# Patient Record
Sex: Female | Born: 1940 | Race: White | Hispanic: No | Marital: Married | Smoking: Never smoker
Health system: Southern US, Community
[De-identification: ages and names within clinical notes are randomized; demographics above are authoritative.]

## PROBLEM LIST (undated history)

## (undated) DIAGNOSIS — G5793 Unspecified mononeuropathy of bilateral lower limbs: Secondary | ICD-10-CM

## (undated) DIAGNOSIS — B54 Unspecified malaria: Secondary | ICD-10-CM

## (undated) DIAGNOSIS — M1990 Unspecified osteoarthritis, unspecified site: Secondary | ICD-10-CM

## (undated) DIAGNOSIS — E039 Hypothyroidism, unspecified: Secondary | ICD-10-CM

## (undated) DIAGNOSIS — G709 Myoneural disorder, unspecified: Secondary | ICD-10-CM

## (undated) DIAGNOSIS — J189 Pneumonia, unspecified organism: Secondary | ICD-10-CM

## (undated) DIAGNOSIS — E119 Type 2 diabetes mellitus without complications: Secondary | ICD-10-CM

## (undated) DIAGNOSIS — M797 Fibromyalgia: Secondary | ICD-10-CM

## (undated) DIAGNOSIS — I10 Essential (primary) hypertension: Secondary | ICD-10-CM

## (undated) DIAGNOSIS — F32A Depression, unspecified: Secondary | ICD-10-CM

## (undated) DIAGNOSIS — K219 Gastro-esophageal reflux disease without esophagitis: Secondary | ICD-10-CM

## (undated) DIAGNOSIS — F329 Major depressive disorder, single episode, unspecified: Secondary | ICD-10-CM

## (undated) DIAGNOSIS — G4730 Sleep apnea, unspecified: Secondary | ICD-10-CM

## (undated) DIAGNOSIS — Z8719 Personal history of other diseases of the digestive system: Secondary | ICD-10-CM

## (undated) DIAGNOSIS — I2510 Atherosclerotic heart disease of native coronary artery without angina pectoris: Secondary | ICD-10-CM

## (undated) HISTORY — PX: BACK SURGERY: SHX140

## (undated) HISTORY — PX: TONSILLECTOMY: SUR1361

## (undated) HISTORY — PX: CHOLECYSTECTOMY: SHX55

## (undated) HISTORY — PX: APPENDECTOMY: SHX54

## (undated) HISTORY — PX: ABDOMINAL HYSTERECTOMY: SHX81

## (undated) SURGERY — EGD (ESOPHAGOGASTRODUODENOSCOPY)
Anesthesia: Moderate Sedation

---

## 1995-03-23 DIAGNOSIS — J189 Pneumonia, unspecified organism: Secondary | ICD-10-CM

## 1995-03-23 HISTORY — DX: Pneumonia, unspecified organism: J18.9

## 2000-03-22 HISTORY — PX: BLADDER SUSPENSION: SHX72

## 2004-01-08 ENCOUNTER — Ambulatory Visit: Payer: Self-pay | Admitting: Internal Medicine

## 2005-02-18 ENCOUNTER — Ambulatory Visit: Payer: Self-pay | Admitting: Internal Medicine

## 2006-02-22 ENCOUNTER — Ambulatory Visit: Payer: Self-pay | Admitting: Internal Medicine

## 2007-01-16 ENCOUNTER — Ambulatory Visit: Payer: Self-pay | Admitting: Urology

## 2007-03-07 ENCOUNTER — Ambulatory Visit: Payer: Self-pay | Admitting: Internal Medicine

## 2007-04-04 ENCOUNTER — Ambulatory Visit: Payer: Self-pay | Admitting: Gastroenterology

## 2007-05-04 ENCOUNTER — Ambulatory Visit: Payer: Self-pay | Admitting: Gastroenterology

## 2008-03-07 ENCOUNTER — Ambulatory Visit: Payer: Self-pay | Admitting: Internal Medicine

## 2008-10-16 ENCOUNTER — Ambulatory Visit: Payer: Self-pay | Admitting: Urology

## 2008-10-21 ENCOUNTER — Ambulatory Visit: Payer: Self-pay | Admitting: Urology

## 2009-05-29 ENCOUNTER — Ambulatory Visit: Payer: Self-pay | Admitting: Internal Medicine

## 2010-05-28 ENCOUNTER — Ambulatory Visit: Payer: Self-pay

## 2013-01-02 ENCOUNTER — Ambulatory Visit: Payer: Self-pay | Admitting: Internal Medicine

## 2013-09-04 ENCOUNTER — Ambulatory Visit: Payer: Self-pay | Admitting: Internal Medicine

## 2013-09-05 ENCOUNTER — Ambulatory Visit: Payer: Self-pay | Admitting: Urology

## 2014-01-08 DIAGNOSIS — M5136 Other intervertebral disc degeneration, lumbar region: Secondary | ICD-10-CM | POA: Insufficient documentation

## 2014-01-08 DIAGNOSIS — M51369 Other intervertebral disc degeneration, lumbar region without mention of lumbar back pain or lower extremity pain: Secondary | ICD-10-CM | POA: Insufficient documentation

## 2014-05-03 DIAGNOSIS — M4800 Spinal stenosis, site unspecified: Secondary | ICD-10-CM | POA: Insufficient documentation

## 2014-05-03 DIAGNOSIS — E669 Obesity, unspecified: Secondary | ICD-10-CM | POA: Insufficient documentation

## 2014-05-03 DIAGNOSIS — E039 Hypothyroidism, unspecified: Secondary | ICD-10-CM | POA: Insufficient documentation

## 2014-05-03 DIAGNOSIS — I10 Essential (primary) hypertension: Secondary | ICD-10-CM | POA: Insufficient documentation

## 2014-05-03 DIAGNOSIS — I2510 Atherosclerotic heart disease of native coronary artery without angina pectoris: Secondary | ICD-10-CM | POA: Insufficient documentation

## 2014-05-03 DIAGNOSIS — G629 Polyneuropathy, unspecified: Secondary | ICD-10-CM | POA: Insufficient documentation

## 2014-05-03 DIAGNOSIS — E785 Hyperlipidemia, unspecified: Secondary | ICD-10-CM | POA: Insufficient documentation

## 2014-10-17 ENCOUNTER — Ambulatory Visit
Admission: RE | Admit: 2014-10-17 | Discharge: 2014-10-17 | Disposition: A | Discharge status: Home / Self Care | Payer: Medicare Other | Source: Ambulatory Visit | Attending: Otolaryngology | Admitting: Otolaryngology

## 2014-10-17 ENCOUNTER — Other Ambulatory Visit: Payer: Self-pay | Admitting: Otolaryngology

## 2014-10-17 DIAGNOSIS — R05 Cough: Secondary | ICD-10-CM

## 2014-10-17 DIAGNOSIS — R918 Other nonspecific abnormal finding of lung field: Secondary | ICD-10-CM | POA: Insufficient documentation

## 2014-10-17 DIAGNOSIS — R059 Cough, unspecified: Secondary | ICD-10-CM

## 2014-10-17 DIAGNOSIS — K449 Diaphragmatic hernia without obstruction or gangrene: Secondary | ICD-10-CM | POA: Diagnosis not present

## 2014-11-22 ENCOUNTER — Other Ambulatory Visit: Payer: Self-pay | Admitting: Specialist

## 2014-11-22 DIAGNOSIS — J984 Other disorders of lung: Secondary | ICD-10-CM

## 2014-11-28 ENCOUNTER — Ambulatory Visit
Admission: RE | Admit: 2014-11-28 | Discharge: 2014-11-28 | Disposition: A | Discharge status: Home / Self Care | Payer: Medicare Other | Source: Ambulatory Visit | Attending: Specialist | Admitting: Specialist

## 2014-11-28 DIAGNOSIS — K449 Diaphragmatic hernia without obstruction or gangrene: Secondary | ICD-10-CM | POA: Insufficient documentation

## 2014-11-28 DIAGNOSIS — I517 Cardiomegaly: Secondary | ICD-10-CM | POA: Diagnosis not present

## 2014-11-28 DIAGNOSIS — J984 Other disorders of lung: Secondary | ICD-10-CM | POA: Diagnosis not present

## 2014-11-28 DIAGNOSIS — I2510 Atherosclerotic heart disease of native coronary artery without angina pectoris: Secondary | ICD-10-CM | POA: Diagnosis not present

## 2014-11-28 HISTORY — DX: Type 2 diabetes mellitus without complications: E11.9

## 2014-11-28 HISTORY — DX: Essential (primary) hypertension: I10

## 2014-11-28 MED ORDER — IOHEXOL 300 MG/ML  SOLN
75.0000 mL | Freq: Once | INTRAMUSCULAR | Status: AC | PRN
  Administered 2014-11-28: 75 mL via INTRAVENOUS

## 2014-12-05 ENCOUNTER — Other Ambulatory Visit: Payer: Self-pay | Admitting: Specialist

## 2014-12-05 DIAGNOSIS — R0602 Shortness of breath: Secondary | ICD-10-CM

## 2014-12-12 ENCOUNTER — Ambulatory Visit
Admission: RE | Admit: 2014-12-12 | Discharge: 2014-12-12 | Disposition: A | Discharge status: Home / Self Care | Payer: Medicare Other | Source: Ambulatory Visit | Attending: Specialist | Admitting: Specialist

## 2014-12-12 DIAGNOSIS — R0602 Shortness of breath: Secondary | ICD-10-CM

## 2015-05-08 DIAGNOSIS — E1121 Type 2 diabetes mellitus with diabetic nephropathy: Secondary | ICD-10-CM | POA: Insufficient documentation

## 2015-10-23 DIAGNOSIS — M1711 Unilateral primary osteoarthritis, right knee: Secondary | ICD-10-CM | POA: Diagnosis present

## 2015-11-04 DIAGNOSIS — I2721 Secondary pulmonary arterial hypertension: Secondary | ICD-10-CM | POA: Insufficient documentation

## 2015-12-09 ENCOUNTER — Other Ambulatory Visit: Payer: Self-pay | Admitting: Internal Medicine

## 2015-12-09 DIAGNOSIS — Z1231 Encounter for screening mammogram for malignant neoplasm of breast: Secondary | ICD-10-CM

## 2015-12-31 ENCOUNTER — Encounter
Admission: RE | Admit: 2015-12-31 | Discharge: 2015-12-31 | Disposition: A | Discharge status: Home / Self Care | Payer: Medicare Other | Source: Ambulatory Visit | Attending: Orthopedic Surgery | Admitting: Orthopedic Surgery

## 2015-12-31 DIAGNOSIS — Z01810 Encounter for preprocedural cardiovascular examination: Secondary | ICD-10-CM | POA: Diagnosis present

## 2015-12-31 DIAGNOSIS — E119 Type 2 diabetes mellitus without complications: Secondary | ICD-10-CM | POA: Diagnosis not present

## 2015-12-31 DIAGNOSIS — Z1231 Encounter for screening mammogram for malignant neoplasm of breast: Secondary | ICD-10-CM | POA: Diagnosis not present

## 2015-12-31 DIAGNOSIS — Z01812 Encounter for preprocedural laboratory examination: Secondary | ICD-10-CM | POA: Diagnosis present

## 2015-12-31 DIAGNOSIS — I10 Essential (primary) hypertension: Secondary | ICD-10-CM | POA: Diagnosis not present

## 2015-12-31 HISTORY — DX: Pneumonia, unspecified organism: J18.9

## 2015-12-31 HISTORY — DX: Unspecified mononeuropathy of bilateral lower limbs: G57.93

## 2015-12-31 HISTORY — DX: Depression, unspecified: F32.A

## 2015-12-31 HISTORY — DX: Myoneural disorder, unspecified: G70.9

## 2015-12-31 HISTORY — DX: Major depressive disorder, single episode, unspecified: F32.9

## 2015-12-31 HISTORY — DX: Gastro-esophageal reflux disease without esophagitis: K21.9

## 2015-12-31 HISTORY — DX: Unspecified malaria: B54

## 2015-12-31 HISTORY — DX: Fibromyalgia: M79.7

## 2015-12-31 HISTORY — DX: Unspecified osteoarthritis, unspecified site: M19.90

## 2015-12-31 HISTORY — DX: Personal history of other diseases of the digestive system: Z87.19

## 2015-12-31 HISTORY — DX: Hypothyroidism, unspecified: E03.9

## 2015-12-31 LAB — COMPREHENSIVE METABOLIC PANEL
ALBUMIN: 4.1 g/dL (ref 3.5–5.0)
ALT: 16 U/L (ref 14–54)
ANION GAP: 9 (ref 5–15)
AST: 21 U/L (ref 15–41)
Alkaline Phosphatase: 98 U/L (ref 38–126)
BUN: 25 mg/dL — AB (ref 6–20)
CHLORIDE: 101 mmol/L (ref 101–111)
CO2: 28 mmol/L (ref 22–32)
Calcium: 10.4 mg/dL — ABNORMAL HIGH (ref 8.9–10.3)
Creatinine, Ser: 1.17 mg/dL — ABNORMAL HIGH (ref 0.44–1.00)
GFR calc Af Amer: 52 mL/min — ABNORMAL LOW (ref >60)
GFR calc non Af Amer: 45 mL/min — ABNORMAL LOW (ref >60)
GLUCOSE: 115 mg/dL — AB (ref 65–99)
POTASSIUM: 4 mmol/L (ref 3.5–5.1)
SODIUM: 138 mmol/L (ref 135–145)
TOTAL PROTEIN: 7.2 g/dL (ref 6.5–8.1)
Total Bilirubin: 0.5 mg/dL (ref 0.3–1.2)

## 2015-12-31 LAB — TYPE AND SCREEN
ABO/RH(D): A POS
ANTIBODY SCREEN: NEGATIVE

## 2015-12-31 LAB — URINALYSIS COMPLETE WITH MICROSCOPIC (ARMC ONLY)
BACTERIA UA: NONE SEEN
Bilirubin Urine: NEGATIVE
GLUCOSE, UA: NEGATIVE mg/dL
Hgb urine dipstick: NEGATIVE
Ketones, ur: NEGATIVE mg/dL
Leukocytes, UA: NEGATIVE
NITRITE: NEGATIVE
PROTEIN: NEGATIVE mg/dL
SPECIFIC GRAVITY, URINE: 1.01 (ref 1.005–1.030)
pH: 6 (ref 5.0–8.0)

## 2015-12-31 LAB — CBC
HCT: 36.5 % (ref 35.0–47.0)
Hemoglobin: 12.1 g/dL (ref 12.0–16.0)
MCH: 31.6 pg (ref 26.0–34.0)
MCHC: 33.1 g/dL (ref 32.0–36.0)
MCV: 95.6 fL (ref 80.0–100.0)
PLATELETS: 252 10*3/uL (ref 150–440)
RBC: 3.82 MIL/uL (ref 3.80–5.20)
RDW: 13.7 % (ref 11.5–14.5)
WBC: 8.3 10*3/uL (ref 3.6–11.0)

## 2015-12-31 LAB — SEDIMENTATION RATE: SED RATE: 16 mm/h (ref 0–30)

## 2015-12-31 LAB — APTT: APTT: 31 s (ref 24–36)

## 2015-12-31 LAB — SURGICAL PCR SCREEN
MRSA, PCR: NEGATIVE
STAPHYLOCOCCUS AUREUS: NEGATIVE

## 2015-12-31 LAB — PROTIME-INR
INR: 0.95
PROTHROMBIN TIME: 12.7 s (ref 11.4–15.2)

## 2015-12-31 NOTE — Patient Instructions (Signed)
  Your procedure is scheduled on: Monday Jan 12, 2016. Report to Same Day Surgery. To find out your arrival time please call [phone] between 1PM - 3PM on Friday Oct. 20, 2017.  Remember: Instructions that are not followed completely may result in serious medical risk, up to and including death, or upon the discretion of your surgeon and anesthesiologist your surgery may need to be rescheduled.    _x___ 1. Do not eat food or drink liquids after midnight. No gum chewing or hard candies.     ____ 2. No Alcohol for 24 hours before or after surgery.   ____ 3. Bring all medications with you on the day of surgery if instructed.    __x__ 4. Notify your doctor if there is any change in your medical condition     (cold, fever, infections).    _____ 5. No smoking 24 hours prior to surgery.     Do not wear jewelry, make-up, hairpins, clips or nail polish.  Do not wear lotions, powders, or perfumes.   Do not shave 48 hours prior to surgery. Men may shave face and neck.  Do not bring valuables to the hospital.    [facility] is not responsible for any belongings or valuables.               Contacts, dentures or bridgework may not be worn into surgery.  Leave your suitcase in the car. After surgery it may be brought to your room.  For patients admitted to the hospital, discharge time is determined by your treatment team.   Patients discharged the day of surgery will not be allowed to drive home.    Please read over the following fact sheets that you were given:   [facility] Preparing [facility]  _x___ Take these medicines the morning of surgery with A SIP OF WATER:    1.amLODipine (NORVASC)    2. atorvastatin (LIPITOR)  3. citalopram (CELEXA)  4.gabapentin (NEURONTIN)  5.levothyroxine (SYNTHROID, LEVOTHROID)  6.pantoprazole (PROTONIX)  ____ Fleet Enema (as directed)   _x___ Use CHG Soap as directed on instruction sheet  ____ Use inhalers on the day of surgery and bring to  hospital day of surgery  ____ Stop metformin 2 days prior to surgery    _x___ Take 1/2 of usual insulin dose the night before surgery and none on the morning of surgery.   ____ Stop Coumadin/Plavix/aspirin on does not apply.  _x___ Stop Anti-inflammatories such as Advil, Aleve, Ibuprofen, Motrin, Naproxen, Naprosyn, Goodies powders or aspirin products. OK to take Tylenol.   ____ Stop supplements until after surgery.    ____ Bring C-Pap to the hospital.

## 2015-12-31 NOTE — Pre-Procedure Instructions (Signed)
REQUEST FOR MEDICAL CLEARANCE/EKG,AS INSTRUCTED BY DR ADAMS CALLED AND FAXED TO [facility] AT DR [facility] 

## 2016-01-01 ENCOUNTER — Ambulatory Visit
Admission: RE | Admit: 2016-01-01 | Discharge: 2016-01-01 | Disposition: A | Discharge status: Home / Self Care | Payer: Medicare Other | Source: Ambulatory Visit | Attending: Internal Medicine | Admitting: Internal Medicine

## 2016-01-01 DIAGNOSIS — Z01812 Encounter for preprocedural laboratory examination: Secondary | ICD-10-CM | POA: Insufficient documentation

## 2016-01-01 DIAGNOSIS — Z1231 Encounter for screening mammogram for malignant neoplasm of breast: Secondary | ICD-10-CM | POA: Insufficient documentation

## 2016-01-01 DIAGNOSIS — E119 Type 2 diabetes mellitus without complications: Secondary | ICD-10-CM | POA: Insufficient documentation

## 2016-01-01 DIAGNOSIS — I10 Essential (primary) hypertension: Secondary | ICD-10-CM | POA: Insufficient documentation

## 2016-01-01 DIAGNOSIS — Z01810 Encounter for preprocedural cardiovascular examination: Secondary | ICD-10-CM | POA: Insufficient documentation

## 2016-01-01 LAB — URINE CULTURE
Culture: <10000 — AB
SPECIAL REQUESTS: NORMAL

## 2016-01-01 LAB — HEMOGLOBIN A1C
HEMOGLOBIN A1C: 5.9 % — AB (ref 4.8–5.6)
Mean Plasma Glucose: 123 mg/dL

## 2016-01-12 ENCOUNTER — Inpatient Hospital Stay
Admission: RE | Admit: 2016-01-12 | Discharge: 2016-01-14 | DRG: 470 | Disposition: A | Discharge status: Home Health | Payer: Medicare Other | Source: Ambulatory Visit | Attending: Orthopedic Surgery | Admitting: Orthopedic Surgery

## 2016-01-12 ENCOUNTER — Encounter: Payer: Self-pay | Admitting: Orthopedic Surgery

## 2016-01-12 ENCOUNTER — Inpatient Hospital Stay: Payer: Medicare Other | Admitting: Anesthesiology

## 2016-01-12 ENCOUNTER — Encounter
Admission: RE | Disposition: A | Discharge status: Home Health | Payer: Self-pay | Source: Ambulatory Visit | Attending: Orthopedic Surgery

## 2016-01-12 ENCOUNTER — Inpatient Hospital Stay: Payer: Medicare Other

## 2016-01-12 DIAGNOSIS — K219 Gastro-esophageal reflux disease without esophagitis: Secondary | ICD-10-CM | POA: Diagnosis present

## 2016-01-12 DIAGNOSIS — M1711 Unilateral primary osteoarthritis, right knee: Principal | ICD-10-CM | POA: Diagnosis present

## 2016-01-12 DIAGNOSIS — E669 Obesity, unspecified: Secondary | ICD-10-CM | POA: Diagnosis present

## 2016-01-12 DIAGNOSIS — E785 Hyperlipidemia, unspecified: Secondary | ICD-10-CM | POA: Diagnosis present

## 2016-01-12 DIAGNOSIS — F329 Major depressive disorder, single episode, unspecified: Secondary | ICD-10-CM | POA: Diagnosis present

## 2016-01-12 DIAGNOSIS — Z23 Encounter for immunization: Secondary | ICD-10-CM

## 2016-01-12 DIAGNOSIS — I2721 Secondary pulmonary arterial hypertension: Secondary | ICD-10-CM | POA: Diagnosis present

## 2016-01-12 DIAGNOSIS — D62 Acute posthemorrhagic anemia: Secondary | ICD-10-CM | POA: Diagnosis not present

## 2016-01-12 DIAGNOSIS — R262 Difficulty in walking, not elsewhere classified: Secondary | ICD-10-CM

## 2016-01-12 DIAGNOSIS — I2510 Atherosclerotic heart disease of native coronary artery without angina pectoris: Secondary | ICD-10-CM | POA: Diagnosis present

## 2016-01-12 DIAGNOSIS — I10 Essential (primary) hypertension: Secondary | ICD-10-CM | POA: Diagnosis present

## 2016-01-12 DIAGNOSIS — Z96659 Presence of unspecified artificial knee joint: Secondary | ICD-10-CM

## 2016-01-12 DIAGNOSIS — M6281 Muscle weakness (generalized): Secondary | ICD-10-CM

## 2016-01-12 DIAGNOSIS — M797 Fibromyalgia: Secondary | ICD-10-CM | POA: Diagnosis present

## 2016-01-12 DIAGNOSIS — E039 Hypothyroidism, unspecified: Secondary | ICD-10-CM | POA: Diagnosis present

## 2016-01-12 DIAGNOSIS — E1142 Type 2 diabetes mellitus with diabetic polyneuropathy: Secondary | ICD-10-CM | POA: Diagnosis present

## 2016-01-12 DIAGNOSIS — Z6841 Body Mass Index (BMI) 40.0 and over, adult: Secondary | ICD-10-CM

## 2016-01-12 DIAGNOSIS — Z79899 Other long term (current) drug therapy: Secondary | ICD-10-CM

## 2016-01-12 DIAGNOSIS — M25561 Pain in right knee: Secondary | ICD-10-CM | POA: Diagnosis present

## 2016-01-12 HISTORY — PX: KNEE ARTHROPLASTY: SHX992

## 2016-01-12 LAB — GLUCOSE, CAPILLARY
GLUCOSE-CAPILLARY: 174 mg/dL — AB (ref 65–99)
Glucose-Capillary: 134 mg/dL — ABNORMAL HIGH (ref 65–99)
Glucose-Capillary: 211 mg/dL — ABNORMAL HIGH (ref 65–99)

## 2016-01-12 LAB — ABO/RH: ABO/RH(D): A POS

## 2016-01-12 SURGERY — ARTHROPLASTY, KNEE, TOTAL, USING IMAGELESS COMPUTER-ASSISTED NAVIGATION
Anesthesia: General | Laterality: Right | Wound class: Clean

## 2016-01-12 MED ORDER — HYDROMORPHONE HCL 1 MG/ML IJ SOLN
INTRAMUSCULAR | Status: AC
  Filled 2016-01-12: qty 1

## 2016-01-12 MED ORDER — ACETAMINOPHEN 10 MG/ML IV SOLN
INTRAVENOUS | Status: DC | PRN
Start: 2016-01-12 — End: 2016-01-12
  Administered 2016-01-12: 1000 mg via INTRAVENOUS

## 2016-01-12 MED ORDER — ENOXAPARIN SODIUM 40 MG/0.4ML ~~LOC~~ SOLN
40.0000 mg | Freq: Two times a day (BID) | SUBCUTANEOUS | Status: DC
  Administered 2016-01-13 – 2016-01-14 (×3): 40 mg via SUBCUTANEOUS
  Filled 2016-01-12 (×3): qty 0.4

## 2016-01-12 MED ORDER — SODIUM CHLORIDE 0.9 % IV SOLN
1000.0000 mg | INTRAVENOUS | Status: AC
  Administered 2016-01-12: 1000 mg via INTRAVENOUS
  Filled 2016-01-12: qty 10

## 2016-01-12 MED ORDER — BUPIVACAINE HCL (PF) 0.25 % IJ SOLN
INTRAMUSCULAR | Status: AC
  Filled 2016-01-12: qty 60

## 2016-01-12 MED ORDER — ACETAMINOPHEN 325 MG PO TABS: 650.0000 mg | ORAL_TABLET | Freq: Four times a day (QID) | ORAL | Status: DC | PRN

## 2016-01-12 MED ORDER — ONDANSETRON HCL 4 MG/2ML IJ SOLN: 4.0000 mg | Freq: Four times a day (QID) | INTRAMUSCULAR | Status: DC | PRN

## 2016-01-12 MED ORDER — LIDOCAINE HCL (CARDIAC) 20 MG/ML IV SOLN
INTRAVENOUS | Status: DC | PRN
  Administered 2016-01-12: 100 mg via INTRAVENOUS

## 2016-01-12 MED ORDER — BUPIVACAINE HCL (PF) 0.25 % IJ SOLN
INTRAMUSCULAR | Status: AC
  Filled 2016-01-12: qty 30

## 2016-01-12 MED ORDER — OXYCODONE HCL 5 MG PO TABS: 5.0000 mg | ORAL_TABLET | Freq: Once | ORAL | Status: DC | PRN

## 2016-01-12 MED ORDER — CHLORHEXIDINE GLUCONATE 4 % EX LIQD: 60.0000 mL | Freq: Once | CUTANEOUS | Status: DC

## 2016-01-12 MED ORDER — FLEET ENEMA 7-19 GM/118ML RE ENEM: 1.0000 | ENEMA | Freq: Once | RECTAL | Status: DC | PRN

## 2016-01-12 MED ORDER — PHENYLEPHRINE HCL 10 MG/ML IJ SOLN
INTRAMUSCULAR | Status: DC | PRN
  Administered 2016-01-12 (×2): 100 ug via INTRAVENOUS
  Administered 2016-01-12 (×2): 200 ug via INTRAVENOUS
  Administered 2016-01-12 (×3): 100 ug via INTRAVENOUS

## 2016-01-12 MED ORDER — NEOMYCIN-POLYMYXIN B GU 40-200000 IR SOLN
Status: DC | PRN
Start: 2016-01-12 — End: 2016-01-12
  Administered 2016-01-12: 16 mL

## 2016-01-12 MED ORDER — MORPHINE SULFATE (PF) 2 MG/ML IV SOLN: 2.0000 mg | INTRAVENOUS | Status: DC | PRN

## 2016-01-12 MED ORDER — ADULT MULTIVITAMIN W/MINERALS CH
1.0000 | ORAL_TABLET | ORAL | Status: DC
  Administered 2016-01-13 – 2016-01-14 (×3): 1 via ORAL
  Filled 2016-01-12 (×3): qty 1

## 2016-01-12 MED ORDER — ALUM & MAG HYDROXIDE-SIMETH 200-200-20 MG/5ML PO SUSP: 30.0000 mL | ORAL | Status: DC | PRN

## 2016-01-12 MED ORDER — TRAMADOL HCL 50 MG PO TABS
50.0000 mg | ORAL_TABLET | ORAL | Status: DC | PRN
  Administered 2016-01-12 – 2016-01-14 (×2): 50 mg via ORAL
  Filled 2016-01-12 (×2): qty 1

## 2016-01-12 MED ORDER — CELECOXIB 200 MG PO CAPS
200.0000 mg | ORAL_CAPSULE | Freq: Two times a day (BID) | ORAL | Status: DC
  Administered 2016-01-12 – 2016-01-14 (×4): 200 mg via ORAL
  Filled 2016-01-12 (×4): qty 1

## 2016-01-12 MED ORDER — CEFAZOLIN SODIUM-DEXTROSE 2-4 GM/100ML-% IV SOLN
2.0000 g | Freq: Four times a day (QID) | INTRAVENOUS | Status: AC
  Administered 2016-01-12 – 2016-01-13 (×4): 2 g via INTRAVENOUS
  Filled 2016-01-12 (×5): qty 100

## 2016-01-12 MED ORDER — DEXAMETHASONE SODIUM PHOSPHATE 10 MG/ML IJ SOLN
INTRAMUSCULAR | Status: DC | PRN
  Administered 2016-01-12: 10 mg via INTRAVENOUS

## 2016-01-12 MED ORDER — ROCURONIUM BROMIDE 100 MG/10ML IV SOLN
INTRAVENOUS | Status: DC | PRN
  Administered 2016-01-12: 10 mg via INTRAVENOUS
  Administered 2016-01-12: 20 mg via INTRAVENOUS

## 2016-01-12 MED ORDER — ONDANSETRON HCL 4 MG PO TABS: 4.0000 mg | ORAL_TABLET | Freq: Four times a day (QID) | ORAL | Status: DC | PRN

## 2016-01-12 MED ORDER — CEFAZOLIN SODIUM-DEXTROSE 2-4 GM/100ML-% IV SOLN
INTRAVENOUS | Status: AC
  Filled 2016-01-12: qty 100

## 2016-01-12 MED ORDER — METFORMIN HCL 850 MG PO TABS
850.0000 mg | ORAL_TABLET | Freq: Two times a day (BID) | ORAL | Status: DC
  Administered 2016-01-12 – 2016-01-14 (×5): 850 mg via ORAL
  Filled 2016-01-12 (×5): qty 1

## 2016-01-12 MED ORDER — PIOGLITAZONE HCL 15 MG PO TABS
15.0000 mg | ORAL_TABLET | Freq: Two times a day (BID) | ORAL | Status: DC
  Administered 2016-01-12 – 2016-01-14 (×5): 15 mg via ORAL
  Filled 2016-01-12 (×7): qty 1

## 2016-01-12 MED ORDER — BUPIVACAINE LIPOSOME 1.3 % IJ SUSP
INTRAMUSCULAR | Status: AC
  Filled 2016-01-12: qty 20

## 2016-01-12 MED ORDER — MAGNESIUM HYDROXIDE 400 MG/5ML PO SUSP
30.0000 mL | Freq: Every day | ORAL | Status: DC | PRN
  Administered 2016-01-13 – 2016-01-14 (×2): 30 mL via ORAL
  Filled 2016-01-12 (×2): qty 30

## 2016-01-12 MED ORDER — HYDROCHLOROTHIAZIDE 12.5 MG PO CAPS
12.5000 mg | ORAL_CAPSULE | Freq: Every day | ORAL | Status: DC
  Administered 2016-01-13: 12.5 mg via ORAL
  Filled 2016-01-12 (×2): qty 1

## 2016-01-12 MED ORDER — NEOMYCIN-POLYMYXIN B GU 40-200000 IR SOLN
Status: AC
  Filled 2016-01-12: qty 20

## 2016-01-12 MED ORDER — SUGAMMADEX SODIUM 200 MG/2ML IV SOLN
INTRAVENOUS | Status: DC | PRN
  Administered 2016-01-12: 225 mg via INTRAVENOUS

## 2016-01-12 MED ORDER — MENTHOL 3 MG MT LOZG
1.0000 | LOZENGE | OROMUCOSAL | Status: DC | PRN
  Filled 2016-01-12: qty 9

## 2016-01-12 MED ORDER — ETOMIDATE 2 MG/ML IV SOLN
INTRAVENOUS | Status: DC | PRN
  Administered 2016-01-12: 20 mg via INTRAVENOUS

## 2016-01-12 MED ORDER — FERROUS SULFATE 325 (65 FE) MG PO TABS
325.0000 mg | ORAL_TABLET | ORAL | Status: DC
  Administered 2016-01-13 – 2016-01-14 (×3): 325 mg via ORAL
  Filled 2016-01-12 (×3): qty 1

## 2016-01-12 MED ORDER — DIPHENHYDRAMINE HCL 12.5 MG/5ML PO ELIX: 12.5000 mg | ORAL_SOLUTION | ORAL | Status: DC | PRN

## 2016-01-12 MED ORDER — ACETAMINOPHEN 10 MG/ML IV SOLN
INTRAVENOUS | Status: AC
Start: 2016-01-12 — End: 2016-01-12
  Filled 2016-01-12: qty 100

## 2016-01-12 MED ORDER — CEFAZOLIN SODIUM-DEXTROSE 2-4 GM/100ML-% IV SOLN
2.0000 g | INTRAVENOUS | Status: AC
  Administered 2016-01-12: 2 g via INTRAVENOUS

## 2016-01-12 MED ORDER — LISINOPRIL-HYDROCHLOROTHIAZIDE 20-12.5 MG PO TABS: 1.0000 | ORAL_TABLET | ORAL | Status: DC

## 2016-01-12 MED ORDER — OXYCODONE HCL 5 MG/5ML PO SOLN: 5.0000 mg | Freq: Once | ORAL | Status: DC | PRN

## 2016-01-12 MED ORDER — BISACODYL 10 MG RE SUPP
10.0000 mg | Freq: Every day | RECTAL | Status: DC | PRN
  Administered 2016-01-14: 10 mg via RECTAL
  Filled 2016-01-12: qty 1

## 2016-01-12 MED ORDER — FENTANYL CITRATE (PF) 100 MCG/2ML IJ SOLN: 25.0000 ug | INTRAMUSCULAR | Status: DC | PRN

## 2016-01-12 MED ORDER — ACETAMINOPHEN 10 MG/ML IV SOLN
1000.0000 mg | Freq: Four times a day (QID) | INTRAVENOUS | Status: AC
  Administered 2016-01-12 – 2016-01-13 (×4): 1000 mg via INTRAVENOUS
  Filled 2016-01-12 (×5): qty 100

## 2016-01-12 MED ORDER — OXYCODONE HCL 5 MG PO TABS
5.0000 mg | ORAL_TABLET | ORAL | Status: DC | PRN
  Administered 2016-01-12 – 2016-01-14 (×5): 5 mg via ORAL
  Filled 2016-01-12 (×5): qty 1

## 2016-01-12 MED ORDER — METOCLOPRAMIDE HCL 10 MG PO TABS
10.0000 mg | ORAL_TABLET | Freq: Three times a day (TID) | ORAL | Status: AC
  Administered 2016-01-12 – 2016-01-14 (×7): 10 mg via ORAL
  Filled 2016-01-12 (×7): qty 1

## 2016-01-12 MED ORDER — EPHEDRINE SULFATE 50 MG/ML IJ SOLN
INTRAMUSCULAR | Status: DC | PRN
Start: 2016-01-12 — End: 2016-01-12
  Administered 2016-01-12 (×2): 10 mg via INTRAVENOUS
  Administered 2016-01-12: 15 mg via INTRAVENOUS
  Administered 2016-01-12: 5 mg via INTRAVENOUS

## 2016-01-12 MED ORDER — SODIUM CHLORIDE 0.9 % IV SOLN
INTRAVENOUS | Status: DC | PRN
  Administered 2016-01-12: 60 mL

## 2016-01-12 MED ORDER — SODIUM CHLORIDE 0.9 % IV SOLN
1000.0000 mg | Freq: Once | INTRAVENOUS | Status: AC
  Administered 2016-01-12: 1000 mg via INTRAVENOUS
  Filled 2016-01-12: qty 10

## 2016-01-12 MED ORDER — INSULIN ASPART 100 UNIT/ML ~~LOC~~ SOLN
0.0000 [IU] | Freq: Three times a day (TID) | SUBCUTANEOUS | Status: DC
  Administered 2016-01-12: 5 [IU] via SUBCUTANEOUS
  Administered 2016-01-13: 3 [IU] via SUBCUTANEOUS
  Administered 2016-01-13 – 2016-01-14 (×3): 2 [IU] via SUBCUTANEOUS
  Filled 2016-01-12: qty 3
  Filled 2016-01-12: qty 5
  Filled 2016-01-12 (×3): qty 2

## 2016-01-12 MED ORDER — MIDAZOLAM HCL 2 MG/2ML IJ SOLN
INTRAMUSCULAR | Status: DC | PRN
Start: 2016-01-12 — End: 2016-01-12
  Administered 2016-01-12: 1 mg via INTRAVENOUS

## 2016-01-12 MED ORDER — PHENOL 1.4 % MT LIQD
1.0000 | OROMUCOSAL | Status: DC | PRN
  Filled 2016-01-12: qty 177

## 2016-01-12 MED ORDER — CALCIUM CARBONATE-VITAMIN D 500-200 MG-UNIT PO TABS
1.0000 | ORAL_TABLET | Freq: Two times a day (BID) | ORAL | Status: DC
  Administered 2016-01-13 – 2016-01-14 (×3): 1 via ORAL
  Filled 2016-01-12 (×3): qty 1

## 2016-01-12 MED ORDER — LIRAGLUTIDE 18 MG/3ML ~~LOC~~ SOPN
0.6000 mg | PEN_INJECTOR | SUBCUTANEOUS | Status: DC
  Administered 2016-01-13: 0.6 mg via SUBCUTANEOUS
  Filled 2016-01-12 (×2): qty 3

## 2016-01-12 MED ORDER — PIOGLITAZONE HCL-METFORMIN HCL 15-850 MG PO TABS: 1.0000 | ORAL_TABLET | Freq: Two times a day (BID) | ORAL | Status: DC

## 2016-01-12 MED ORDER — SUCCINYLCHOLINE CHLORIDE 20 MG/ML IJ SOLN
INTRAMUSCULAR | Status: DC | PRN
Start: 2016-01-12 — End: 2016-01-12
  Administered 2016-01-12: 120 mg via INTRAVENOUS

## 2016-01-12 MED ORDER — SODIUM CHLORIDE 0.9 % IV SOLN
INTRAVENOUS | Status: DC
  Administered 2016-01-12: 17:00:00 via INTRAVENOUS
  Administered 2016-01-13: 1000 mL via INTRAVENOUS

## 2016-01-12 MED ORDER — ACETAMINOPHEN 650 MG RE SUPP: 650.0000 mg | Freq: Four times a day (QID) | RECTAL | Status: DC | PRN

## 2016-01-12 MED ORDER — HYDROMORPHONE HCL 1 MG/ML IJ SOLN
INTRAMUSCULAR | Status: DC | PRN
  Administered 2016-01-12: 0.5 mg via INTRAVENOUS
  Administered 2016-01-12 (×2): .25 mg via INTRAVENOUS

## 2016-01-12 MED ORDER — CITALOPRAM HYDROBROMIDE 20 MG PO TABS
20.0000 mg | ORAL_TABLET | ORAL | Status: DC
  Administered 2016-01-13 – 2016-01-14 (×3): 20 mg via ORAL
  Filled 2016-01-12 (×3): qty 1

## 2016-01-12 MED ORDER — LEVOTHYROXINE SODIUM 50 MCG PO TABS
50.0000 ug | ORAL_TABLET | Freq: Every day | ORAL | Status: DC
  Administered 2016-01-13 – 2016-01-14 (×2): 50 ug via ORAL
  Filled 2016-01-12 (×2): qty 1

## 2016-01-12 MED ORDER — FENTANYL CITRATE (PF) 100 MCG/2ML IJ SOLN
INTRAMUSCULAR | Status: DC | PRN
  Administered 2016-01-12: 100 ug via INTRAVENOUS

## 2016-01-12 MED ORDER — LISINOPRIL 20 MG PO TABS
20.0000 mg | ORAL_TABLET | Freq: Every day | ORAL | Status: DC
  Administered 2016-01-13: 20 mg via ORAL
  Filled 2016-01-12 (×2): qty 1

## 2016-01-12 MED ORDER — BUPIVACAINE HCL (PF) 0.25 % IJ SOLN
INTRAMUSCULAR | Status: DC | PRN
  Administered 2016-01-12: 60 mL via INTRA_ARTICULAR

## 2016-01-12 MED ORDER — ONDANSETRON HCL 4 MG/2ML IJ SOLN
INTRAMUSCULAR | Status: DC | PRN
  Administered 2016-01-12: 4 mg via INTRAVENOUS

## 2016-01-12 MED ORDER — SODIUM CHLORIDE FLUSH 0.9 % IV SOLN
INTRAVENOUS | Status: AC
  Filled 2016-01-12: qty 10

## 2016-01-12 MED ORDER — PANTOPRAZOLE SODIUM 40 MG PO TBEC
40.0000 mg | DELAYED_RELEASE_TABLET | Freq: Two times a day (BID) | ORAL | Status: DC
  Administered 2016-01-12 – 2016-01-14 (×3): 40 mg via ORAL
  Filled 2016-01-12 (×3): qty 1

## 2016-01-12 MED ORDER — SODIUM CHLORIDE 0.9 % IV SOLN
INTRAVENOUS | Status: DC
  Administered 2016-01-12: 11:00:00 via INTRAVENOUS

## 2016-01-12 MED ORDER — GABAPENTIN 300 MG PO CAPS
300.0000 mg | ORAL_CAPSULE | Freq: Three times a day (TID) | ORAL | Status: DC
  Administered 2016-01-12 – 2016-01-14 (×7): 300 mg via ORAL
  Filled 2016-01-12 (×7): qty 1

## 2016-01-12 MED ORDER — SODIUM CHLORIDE 0.9 % IJ SOLN
INTRAMUSCULAR | Status: AC
  Filled 2016-01-12: qty 50

## 2016-01-12 MED ORDER — ATORVASTATIN CALCIUM 20 MG PO TABS
40.0000 mg | ORAL_TABLET | ORAL | Status: DC
  Administered 2016-01-13 – 2016-01-14 (×3): 40 mg via ORAL
  Filled 2016-01-12 (×3): qty 2

## 2016-01-12 MED ORDER — SENNOSIDES-DOCUSATE SODIUM 8.6-50 MG PO TABS
1.0000 | ORAL_TABLET | Freq: Two times a day (BID) | ORAL | Status: DC
  Administered 2016-01-12 – 2016-01-14 (×4): 1 via ORAL
  Filled 2016-01-12 (×5): qty 1

## 2016-01-12 MED ORDER — AMLODIPINE BESYLATE 10 MG PO TABS
10.0000 mg | ORAL_TABLET | ORAL | Status: DC
  Administered 2016-01-13: 10 mg via ORAL
  Filled 2016-01-12 (×2): qty 1

## 2016-01-12 MED ORDER — NORTRIPTYLINE HCL 25 MG PO CAPS
75.0000 mg | ORAL_CAPSULE | Freq: Every day | ORAL | Status: DC
  Administered 2016-01-12 – 2016-01-13 (×2): 75 mg via ORAL
  Filled 2016-01-12 (×2): qty 3

## 2016-01-12 SURGICAL SUPPLY — 59 items
AUTOTRANSFUS HAS 1/8 (MISCELLANEOUS) ×3
BATTERY INSTRU NAVIGATION (MISCELLANEOUS) ×12 IMPLANT
BLADE SAW 1 (BLADE) ×3 IMPLANT
BLADE SAW 1/2 (BLADE) ×3 IMPLANT
CANISTER SUCT 1200ML W/VALVE (MISCELLANEOUS) ×3 IMPLANT
CANISTER SUCT 3000ML (MISCELLANEOUS) ×6 IMPLANT
CAPT KNEE TOTAL 3 ATTUNE ×3 IMPLANT
CATH TRAY METER 16FR LF (MISCELLANEOUS) ×3 IMPLANT
CEMENT HV SMART SET (Cement) ×6 IMPLANT
COOLER POLAR GLACIER W/PUMP (MISCELLANEOUS) ×3 IMPLANT
CUFF TOURN 24 STER (MISCELLANEOUS) IMPLANT
CUFF TOURN 30 STER DUAL PORT (MISCELLANEOUS) ×3 IMPLANT
DRAPE SHEET LG 3/4 BI-LAMINATE (DRAPES) ×3 IMPLANT
DRSG DERMACEA 8X12 NADH (GAUZE/BANDAGES/DRESSINGS) ×3 IMPLANT
DRSG OPSITE POSTOP 4X14 (GAUZE/BANDAGES/DRESSINGS) ×3 IMPLANT
DRSG TEGADERM 4X4.75 (GAUZE/BANDAGES/DRESSINGS) ×3 IMPLANT
DURAPREP 26ML APPLICATOR (WOUND CARE) ×6 IMPLANT
ELECT CAUTERY BLADE 6.4 (BLADE) ×3 IMPLANT
ELECT REM PT RETURN 9FT ADLT (ELECTROSURGICAL) ×3
ELECTRODE REM PT RTRN 9FT ADLT (ELECTROSURGICAL) ×1 IMPLANT
EX-PIN ORTHOLOCK NAV 4X150 (PIN) ×6 IMPLANT
GLOVE BIOGEL M STRL SZ7.5 (GLOVE) ×6 IMPLANT
GLOVE INDICATOR 8.0 STRL GRN (GLOVE) ×3 IMPLANT
GLOVE SURG 9.0 ORTHO LTXF (GLOVE) ×3 IMPLANT
GLOVE SURG ORTHO 9.0 STRL STRW (GLOVE) ×3 IMPLANT
GOWN STRL REUS W/ TWL LRG LVL3 (GOWN DISPOSABLE) ×2 IMPLANT
GOWN STRL REUS W/TWL 2XL LVL3 (GOWN DISPOSABLE) ×3 IMPLANT
GOWN STRL REUS W/TWL LRG LVL3 (GOWN DISPOSABLE) ×4
HANDPIECE INTERPULSE COAX TIP (DISPOSABLE) ×2
HOLDER FOLEY CATH W/STRAP (MISCELLANEOUS) ×3 IMPLANT
HOOD PEEL AWAY FLYTE STAYCOOL (MISCELLANEOUS) ×6 IMPLANT
KIT RM TURNOVER STRD PROC AR (KITS) ×3 IMPLANT
KNIFE SCULPS 14X20 (INSTRUMENTS) ×3 IMPLANT
LABEL OR SOLS (LABEL) ×3 IMPLANT
NDL SAFETY 18GX1.5 (NEEDLE) ×3 IMPLANT
NEEDLE SPNL 20GX3.5 QUINCKE YW (NEEDLE) ×3 IMPLANT
NS IRRIG 500ML POUR BTL (IV SOLUTION) ×3 IMPLANT
PACK TOTAL KNEE (MISCELLANEOUS) ×3 IMPLANT
PAD WRAPON POLAR KNEE (MISCELLANEOUS) ×1 IMPLANT
PIN DRILL QUICK PACK ×3 IMPLANT
PIN FIXATION 1/8DIA X 3INL (PIN) ×3 IMPLANT
SET HNDPC FAN SPRY TIP SCT (DISPOSABLE) ×1 IMPLANT
SOL .9 NS 3000ML IRR  AL (IV SOLUTION) ×2
SOL .9 NS 3000ML IRR UROMATIC (IV SOLUTION) ×1 IMPLANT
SOL PREP PVP 2OZ (MISCELLANEOUS) ×3
SOLUTION PREP PVP 2OZ (MISCELLANEOUS) ×1 IMPLANT
SPONGE DRAIN TRACH 4X4 STRL 2S (GAUZE/BANDAGES/DRESSINGS) ×3 IMPLANT
STAPLER SKIN PROX 35W (STAPLE) ×3 IMPLANT
SUCTION FRAZIER HANDLE 10FR (MISCELLANEOUS) ×2
SUCTION TUBE FRAZIER 10FR DISP (MISCELLANEOUS) ×1 IMPLANT
SUT VIC AB 0 CT1 36 (SUTURE) ×3 IMPLANT
SUT VIC AB 1 CT1 36 (SUTURE) ×6 IMPLANT
SUT VIC AB 2-0 CT2 27 (SUTURE) ×3 IMPLANT
SYR 20CC LL (SYRINGE) ×3 IMPLANT
SYR 30ML LL (SYRINGE) ×6 IMPLANT
SYSTEM AUTOTRANSFUS DUAL TROCR (MISCELLANEOUS) ×1 IMPLANT
TOWEL OR 17X26 4PK STRL BLUE (TOWEL DISPOSABLE) ×3 IMPLANT
TOWER CARTRIDGE SMART MIX (DISPOSABLE) ×3 IMPLANT
WRAPON POLAR PAD KNEE (MISCELLANEOUS) ×3

## 2016-01-12 NOTE — Op Note (Signed)
OPERATIVE NOTE  DATE OF SURGERY:  01/12/2016  PATIENT NAME:  TANIS HENSARLING   DOB: 01-26-1941  MRN: [id number]  PRE-OPERATIVE DIAGNOSIS: Degenerative arthrosis of the right knee, primary  POST-OPERATIVE DIAGNOSIS:  Same  PROCEDURE:  Right total knee arthroplasty using computer-assisted navigation  SURGEON:  Jena Gauss. M.D.  ASSISTANT:  Van Clines, PA (present and scrubbed throughout the case, critical for assistance with exposure, retraction, instrumentation, and closure)  ANESTHESIA: general  ESTIMATED BLOOD LOSS: 50 mL  FLUIDS REPLACED: 1700 mL of crystalloid  TOURNIQUET TIME: 82 minutes  DRAINS: 2 medium drains to a reinfusion system  SOFT TISSUE RELEASES: Anterior cruciate ligament, posterior cruciate ligament, deep medial collateral ligament, patellofemoral ligament  IMPLANTS UTILIZED: DePuy Attune size 6N posterior stabilized femoral component (cemented), size 5 rotating platform tibial component (cemented), 35 mm medialized dome patella (cemented), and a 6 mm stabilized rotating platform polyethylene insert.  INDICATIONS FOR SURGERY: DERA VANAKEN is a 75 y.o. year old female with a long history of progressive knee pain. X-rays demonstrated severe degenerative changes in tricompartmental fashion. The patient had not seen any significant improvement despite conservative nonsurgical intervention. After discussion of the risks and benefits of surgical intervention, the patient expressed understanding of the risks benefits and agree with plans for total knee arthroplasty.   The risks, benefits, and alternatives were discussed at length including but not limited to the risks of infection, bleeding, nerve injury, stiffness, blood clots, the need for revision surgery, cardiopulmonary complications, among others, and they were willing to proceed.  PROCEDURE IN DETAIL: The patient was brought into the operating room and, after adequate general anesthesia was achieved, a  tourniquet was placed on the patient's upper thigh. The patient's knee and leg were cleaned and prepped with alcohol and DuraPrep and draped in the usual sterile fashion. A "timeout" was performed as per usual protocol. The lower extremity was exsanguinated using an Esmarch, and the tourniquet was inflated to 300 mmHg. An anterior longitudinal incision was made followed by a standard mid vastus approach. The deep fibers of the medial collateral ligament were elevated in a subperiosteal fashion off of the medial flare of the tibia so as to maintain a continuous soft tissue sleeve. The patella was subluxed laterally and the patellofemoral ligament was incised. Inspection of the knee demonstrated severe degenerative changes with full-thickness loss of articular cartilage. Osteophytes were debrided using a rongeur. Anterior and posterior cruciate ligaments were excised. Two 4.0 mm Schanz pins were inserted in the femur and into the tibia for attachment of the array of trackers used for computer-assisted navigation. Hip center was identified using a circumduction technique. Distal landmarks were mapped using the computer. The distal femur and proximal tibia were mapped using the computer. The distal femoral cutting guide was positioned using computer-assisted navigation so as to achieve a 5 distal valgus cut. The femur was sized and it was felt that a size 6N femoral component was appropriate. A size 6 femoral cutting guide was positioned and the anterior cut was performed and verified using the computer. This was followed by completion of the posterior and chamfer cuts. Femoral cutting guide for the central box was then positioned in the center box cut was performed.  Attention was then directed to the proximal tibia. Medial and lateral menisci were excised. The extramedullary tibial cutting guide was positioned using computer-assisted navigation so as to achieve a 0 varus-valgus alignment and 3 posterior slope.  The cut was performed and verified using the computer.  The proximal tibia was sized and it was felt that a size 5 tibial tray was appropriate. Tibial and femoral trials were inserted followed by insertion of a 6 mm polyethylene insert. This allowed for excellent mediolateral soft tissue balancing both in flexion and in full extension. Finally, the patella was cut and prepared so as to accommodate a 35 mm medialized dome patella. A patella trial was placed and the knee was placed through a range of motion with excellent patellar tracking appreciated. The femoral trial was removed after debridement of posterior osteophytes. The central post-hole for the tibial component was reamed followed by insertion of a keel punch. Tibial trials were then removed. Cut surfaces of bone were irrigated with copious amounts of normal saline with antibiotic solution using pulsatile lavage and then suctioned dry. Polymethylmethacrylate cement with gentamicin was prepared in the usual fashion using a vacuum mixer. Cement was applied to the cut surface of the proximal tibia as well as along the undersurface of a size 5 rotating platform tibial component. Tibial component was positioned and impacted into place. Excess cement was removed using Personal assistantreer elevators. Cement was then applied to the cut surfaces of the femur as well as along the posterior flanges of the size 6N femoral component. The femoral component was positioned and impacted into place. Excess cement was removed using Personal assistantreer elevators. A 6 mm polyethylene trial was inserted and the knee was brought into full extension with steady axial compression applied. Finally, cement was applied to the backside of a 35 mm medialized dome patella and the patellar component was positioned and patellar clamp applied. Excess cement was removed using Personal assistantreer elevators. After adequate curing of the cement, the tourniquet was deflated after a total tourniquet time of 82 minutes. Hemostasis was  achieved using electrocautery. The knee was irrigated with copious amounts of normal saline with antibiotic solution using pulsatile lavage and then suctioned dry. 20 mL of 1.3% Exparel and 60 mL of 0.25% Marcaine in 40 mL of normal saline was injected along the posterior capsule, medial and lateral gutters, and along the arthrotomy site. A 6 mm stabilized rotating platform polyethylene insert was inserted and the knee was placed through a range of motion with excellent mediolateral soft tissue balancing appreciated and excellent patellar tracking noted. 2 medium drains were placed in the wound bed and brought out through separate stab incisions to be attached to a reinfusion system. The medial parapatellar portion of the incision was reapproximated using interrupted sutures of #1 Vicryl. Subcutaneous tissue was approximated in layers using first #0 Vicryl followed #2-0 Vicryl. The skin was approximated with skin staples. A sterile dressing was applied.  The patient tolerated the procedure well and was transported to the recovery room in stable condition.    Kyjuan Gause P. Angie FavaHooten, Jr., M.D.

## 2016-01-12 NOTE — Progress Notes (Signed)
Patient admitted from surgery via room bed. Family at bedside. Drain, foley, TEDs, foot pumps, NSL in place from surgery. Started IV fluids and applied bone foam. Bed alarm on for safety.

## 2016-01-12 NOTE — H&P (Signed)
The patient has been re-examined, and the chart reviewed, and there have been no interval changes to the documented history and physical.    The risks, benefits, and alternatives have been discussed at length. The patient expressed understanding of the risks benefits and agreed with plans for surgical intervention.  Lakela Kuba P. Aishia Barkey, Jr. M.D.    

## 2016-01-12 NOTE — Anesthesia Postprocedure Evaluation (Signed)
Anesthesia Post Note  Patient: Leslie Weber  Procedure(s) Performed: Procedure(s) (LRB): COMPUTER ASSISTED TOTAL KNEE ARTHROPLASTY (Right)  Patient location during evaluation: PACU Anesthesia Type: General Level of consciousness: awake and alert Pain management: pain level controlled Vital Signs Assessment: post-procedure vital signs reviewed and stable Respiratory status: spontaneous breathing, nonlabored ventilation, respiratory function stable and patient connected to nasal cannula oxygen Cardiovascular status: blood pressure returned to baseline and stable Postop Assessment: no signs of nausea or vomiting Anesthetic complications: no    Last Vitals:  Vitals:   01/12/16 1500 01/12/16 1529  BP: (!) 112/52 (!) 110/50  Pulse: 83 83  Resp: 11 12  Temp:  36.7 C    Last Pain:  Vitals:   01/12/16 1445  TempSrc:   PainSc: Asleep                 Cleda MccreedyJoseph K Stephannie Broner

## 2016-01-12 NOTE — Anesthesia Procedure Notes (Signed)
Procedure Name: Intubation Date/Time: 01/12/2016 11:13 AM Performed by: Ginger CarneMICHELET, Gabrielle Wakeland Pre-anesthesia Checklist: Patient identified, Emergency Drugs available, Suction available, Patient being monitored and Timeout performed Patient Re-evaluated:Patient Re-evaluated prior to inductionOxygen Delivery Method: Circle system utilized Preoxygenation: Pre-oxygenation with 100% oxygen Intubation Type: IV induction Ventilation: Mask ventilation without difficulty and Oral airway inserted - appropriate to patient size Laryngoscope Size: McGraph and 3 Grade View: Grade II Tube type: Oral Tube size: 7.5 mm Number of attempts: 1 Airway Equipment and Method: Stylet and Video-laryngoscopy Placement Confirmation: ETT inserted through vocal cords under direct vision,  positive ETCO2 and breath sounds checked- equal and bilateral Secured at: 21 cm Tube secured with: Tape Dental Injury: Teeth and Oropharynx as per pre-operative assessment  Difficulty Due To: Difficulty was anticipated, Difficult Airway- due to reduced neck mobility, Difficult Airway- due to anterior larynx and Difficult Airway- due to limited oral opening Future Recommendations: Recommend- induction with short-acting agent, and alternative techniques readily available

## 2016-01-12 NOTE — Transfer of Care (Signed)
Immediate Anesthesia Transfer of Care Note  Patient: Leslie Weber  Procedure(s) Performed: Procedure(s): COMPUTER ASSISTED TOTAL KNEE ARTHROPLASTY (Right)  Patient Location: PACU  Anesthesia Type:General  Level of Consciousness: sedated  Airway & Oxygen Therapy: Patient Spontanous Breathing and Patient connected to face mask oxygen  Post-op Assessment: Report given to RN and Post -op Vital signs reviewed and stable  Post vital signs: Reviewed and stable  Last Vitals:  Vitals:   01/12/16 0957  BP: 101/67  Pulse: 81  Resp: 16  Temp: 36.5 C    Last Pain:  Vitals:   01/12/16 0957  TempSrc: Oral  PainSc: 3          Complications: No apparent anesthesia complications

## 2016-01-12 NOTE — NC FL2 (Signed)
[place] MEDICAID FL2 LEVEL OF CARE SCREENING TOOL     IDENTIFICATION  Patient Name: Leslie Weber Birthdate: Jul 30, 1940 Sex: female Admission Date (Current Location): 01/12/2016  [place]   Facility and Address:  [facility], [address], [place], [place] [address]      Provider Number: [id number]  Attending Physician Name and Address:  Donato HeinzJames P Hooten, MD  Relative Name and Phone Number:       Current Level of Care: Hospital Recommended Level of Care: Skilled Nursing Facility Prior Approval Number:    Date Approved/Denied:   PASRR Number:  [id number] [id number])  Discharge Plan: SNF    Current Diagnoses: Patient Active Problem List   Diagnosis Date Noted  . S/P total knee arthroplasty 01/12/2016  . Pulmonary arterial hypertension 11/04/2015  . Primary osteoarthritis of right knee 10/23/2015  . 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 Obesity 05/03/2014  . Peripheral neuropathy (HCC) 05/03/2014  . Spinal stenosis 05/03/2014  . Hypothyroidism 05/03/2014  . Hypertension 05/03/2014  . Hyperlipidemia, unspecified 05/03/2014  . Coronary artery disease 05/03/2014  . DDD (degenerative disc disease), lumbar 01/08/2014    Orientation RESPIRATION BLADDER Height & Weight     Self, Time, Situation, Place  O2 (3 Liters Oxygen ) Continent Weight: 248 lb (112.5 kg) Height:  5\' 2"  (157.5 cm)  BEHAVIORAL SYMPTOMS/MOOD NEUROLOGICAL BOWEL NUTRITION STATUS   (none)  (none) Continent Diet (Diet: Clear liquid )  AMBULATORY STATUS COMMUNICATION OF NEEDS Skin   Extensive Assist Verbally Surgical wounds (Incision: Right Knee )                       Personal Care Assistance Level of Assistance  Bathing, Feeding, Dressing Bathing Assistance: Limited assistance Feeding assistance: Independent Dressing Assistance: Limited assistance     Functional Limitations Info  Sight, Hearing, Speech Sight Info:  Adequate Hearing Info: Adequate Speech Info: Adequate    SPECIAL CARE FACTORS FREQUENCY  PT (By licensed PT), OT (By licensed OT)     PT Frequency:  (5) OT Frequency:  (5)            Contractures      Additional Factors Info  Code Status, Allergies Code Status Info:  (Full Code. ) Allergies Info:  (No Known Allergies. )           Current Medications (01/12/2016):  This is the current hospital active medication list Current Facility-Administered Medications  Medication Dose Route Frequency Provider Last Rate Last Dose  . 0.9 %  sodium chloride infusion   Intravenous Continuous Donato HeinzJames P Hooten, MD      . acetaminophen (OFIRMEV) IV 1,000 mg  1,000 mg Intravenous Q6H Donato HeinzJames P Hooten, MD      . acetaminophen (TYLENOL) tablet 650 mg  650 mg Oral Q6H PRN Donato HeinzJames P Hooten, MD       Or  . acetaminophen (TYLENOL) suppository 650 mg  650 mg Rectal Q6H PRN Donato HeinzJames P Hooten, MD      . alum & mag hydroxide-simeth (MAALOX/MYLANTA) 200-200-20 MG/5ML suspension 30 mL  30 mL Oral Q4H PRN Donato HeinzJames P Hooten, MD      . Melene Muller[START ON 01/13/2016] amLODipine (NORVASC) tablet 10 mg  10 mg Oral BH-q7a Donato HeinzJames P Hooten, MD      . Melene Muller[START ON 01/13/2016] atorvastatin (LIPITOR) tablet 40 mg  40 mg Oral BH-q7a Donato HeinzJames P Hooten, MD      . bisacodyl (DULCOLAX) suppository 10 mg  10 mg Rectal Daily PRN Donato Heinz, MD      . calcium-vitamin D (OSCAL WITH D) 500-200 MG-UNIT per tablet 1 tablet  1 tablet Oral q12n4p Donato Heinz, MD      . ceFAZolin (ANCEF) IVPB 2g/100 mL premix  2 g Intravenous Q6H Donato Heinz, MD      . celecoxib (CELEBREX) capsule 200 mg  200 mg Oral BID Donato Heinz, MD      . Melene Muller ON 01/13/2016] citalopram (CELEXA) tablet 20 mg  20 mg Oral BH-q7a Donato Heinz, MD      . diphenhydrAMINE (BENADRYL) 12.5 MG/5ML elixir 12.5-25 mg  12.5-25 mg Oral Q4H PRN Donato Heinz, MD      . Melene Muller ON 01/13/2016] enoxaparin (LOVENOX) injection 30 mg  30 mg Subcutaneous Q12H Donato Heinz, MD      .  fentaNYL (SUBLIMAZE) injection 25-50 mcg  25-50 mcg Intravenous Q5 min PRN Rosaria Ferries, MD      . Melene Muller ON 01/13/2016] ferrous sulfate tablet 325 mg  325 mg Oral BH-q7a Donato Heinz, MD      . gabapentin (NEURONTIN) capsule 300 mg  300 mg Oral TID Donato Heinz, MD      . Melene Muller ON 01/13/2016] lisinopril (PRINIVIL,ZESTRIL) tablet 20 mg  20 mg Oral Daily Sheema M Hallaji, RPH       And  . [START ON 01/13/2016] hydrochlorothiazide (MICROZIDE) capsule 12.5 mg  12.5 mg Oral Daily Sheema M Hallaji, RPH      . insulin aspart (novoLOG) injection 0-15 Units  0-15 Units Subcutaneous TID WC Donato Heinz, MD      . Melene Muller ON 01/13/2016] levothyroxine (SYNTHROID, LEVOTHROID) tablet 50 mcg  50 mcg Oral QAC breakfast Donato Heinz, MD      . Melene Muller ON 01/13/2016] liraglutide SOPN 0.6 mg  0.6 mg Subcutaneous BH-q7a Donato Heinz, MD      . magnesium hydroxide (MILK OF MAGNESIA) suspension 30 mL  30 mL Oral Daily PRN Donato Heinz, MD      . menthol-cetylpyridinium (CEPACOL) lozenge 3 mg  1 lozenge Oral PRN Donato Heinz, MD       Or  . phenol (CHLORASEPTIC) mouth spray 1 spray  1 spray Mouth/Throat PRN Donato Heinz, MD      . metFORMIN (GLUCOPHAGE) tablet 850 mg  850 mg Oral BID WC Sheema M Hallaji, RPH       And  . pioglitazone (ACTOS) tablet 15 mg  15 mg Oral BID WC Sheema M Hallaji, RPH      . metoCLOPramide (REGLAN) tablet 10 mg  10 mg Oral TID AC & HS Donato Heinz, MD      . morphine 2 MG/ML injection 2 mg  2 mg Intravenous Q2H PRN Donato Heinz, MD      . Melene Muller ON 01/13/2016] multivitamin with minerals tablet 1 tablet  1 tablet Oral BH-q7a Donato Heinz, MD      . nortriptyline (PAMELOR) capsule 75 mg  75 mg Oral QHS Donato Heinz, MD      . ondansetron (ZOFRAN) tablet 4 mg  4 mg Oral Q6H PRN Donato Heinz, MD       Or  . ondansetron (ZOFRAN) injection 4 mg  4 mg Intravenous Q6H PRN Donato Heinz, MD      . oxyCODONE (Oxy IR/ROXICODONE) immediate release tablet 5 mg  5 mg  Oral Once PRN Cleda Mccreedy Piscitello,  MD       Or  . oxyCODONE (ROXICODONE) 5 MG/5ML solution 5 mg  5 mg Oral Once PRN Rosaria Ferries, MD      . oxyCODONE (Oxy IR/ROXICODONE) immediate release tablet 5-10 mg  5-10 mg Oral Q4H PRN Donato Heinz, MD      . pantoprazole (PROTONIX) EC tablet 40 mg  40 mg Oral BID Donato Heinz, MD      . senna-docusate (Senokot-S) tablet 1 tablet  1 tablet Oral BID Donato Heinz, MD      . sodium chloride flush 0.9 % injection           . sodium phosphate (FLEET) 7-19 GM/118ML enema 1 enema  1 enema Rectal Once PRN Donato Heinz, MD      . traMADol Janean Sark) tablet 50-100 mg  50-100 mg Oral Q4H PRN Donato Heinz, MD         Discharge Medications: Please see discharge summary for a list of discharge medications.  Relevant Imaging Results:  Relevant Lab Results:   Additional Information  (SSN: [id number])  Sample, Darleen Crocker, LCSW

## 2016-01-12 NOTE — Anesthesia Preprocedure Evaluation (Signed)
Anesthesia Evaluation  Patient identified by MRN, date of birth, ID band Patient awake    Reviewed: Allergy & Precautions, H&P , NPO status , Patient's Chart, lab work & pertinent test results  Airway Mallampati: III  TM Distance: <3 FB Neck ROM: limited    Dental no notable dental hx. (+) Poor Dentition, Chipped   Pulmonary shortness of breath and with exertion, pneumonia, resolved,    Pulmonary exam normal breath sounds clear to auscultation       Cardiovascular Exercise Tolerance: Poor hypertension, (-) angina+ CAD and + DOE  Normal cardiovascular exam+ Valvular Problems/Murmurs AS  Rhythm:regular Rate:Normal     Neuro/Psych PSYCHIATRIC DISORDERS Depression  Neuromuscular disease    GI/Hepatic negative GI ROS, Neg liver ROS, hiatal hernia, GERD  Controlled,  Endo/Other  negative endocrine ROSdiabetesHypothyroidism   Renal/GU Renal disease     Musculoskeletal  (+) Arthritis , Fibromyalgia -  Abdominal   Peds  Hematology negative hematology ROS (+)   Anesthesia Other Findings Signs and symptoms suggestive of sleep apnea   Past Medical History: No date: Arthritis No date: Depression No date: Diabetes mellitus without complication (HCC) No date: Fever due to malaria     Comment: 75 years old No date: Fibromyalgia No date: GERD (gastroesophageal reflux disease) No date: History of hiatal hernia No date: Hypertension No date: Hypothyroidism No date: Neuromuscular disorder (HCC)     Comment: Bells palsy left side of face No date: Neuropathy of both feet 1997: Pneumonia     Comment: history of walking pneumonia  Past Surgical History: No date: ABDOMINAL HYSTERECTOMY No date: APPENDECTOMY No date: BACK SURGERY 2002: BLADDER SUSPENSION No date: CHOLECYSTECTOMY No date: TONSILLECTOMY  BMI    Body Mass Index:  45.36 kg/m      Reproductive/Obstetrics negative OB ROS                              Anesthesia Physical Anesthesia Plan  ASA: IV  Anesthesia Plan: General ETT   Post-op Pain Management:    Induction:   Airway Management Planned:   Additional Equipment:   Intra-op Plan:   Post-operative Plan:   Informed Consent: I have reviewed the patients History and Physical, chart, labs and discussed the procedure including the risks, benefits and alternatives for the proposed anesthesia with the patient or authorized representative who has indicated his/her understanding and acceptance.     Plan Discussed with: Anesthesiologist, CRNA and Surgeon  Anesthesia Plan Comments: (Patient informed that they are higher risk for complications from anesthesia during this procedure due to their medical history.  Patient voiced understanding. )        Anesthesia Quick Evaluation

## 2016-01-12 NOTE — Brief Op Note (Signed)
01/12/2016  2:21 PM  PATIENT:  Leslie Weber  75 y.o. female  PRE-OPERATIVE DIAGNOSIS:  PRIMARY OSTEOARTHRITIS of the right knee  POST-OPERATIVE DIAGNOSIS:  Same  PROCEDURE:  Procedure(s): COMPUTER ASSISTED TOTAL KNEE ARTHROPLASTY (Right)  SURGEON:  Surgeon(s) and Role:    * Donato HeinzJames P Darnesha Diloreto, MD - Primary  ASSISTANTS: Van ClinesJon Wolfe, PA   ANESTHESIA:   general  EBL:  Total I/O In: 1700 [I.V.:1700] Out: 450 [Urine:400; Blood:50]  BLOOD ADMINISTERED:none  DRAINS: 2 medium drains to a reinfusion system   LOCAL MEDICATIONS USED:  MARCAINE    and OTHER Exparel  SPECIMEN:  No Specimen  DISPOSITION OF SPECIMEN:  N/A  COUNTS:  YES  TOURNIQUET:    DICTATION: .Dragon Dictation  PLAN OF CARE: Admit to inpatient   PATIENT DISPOSITION:  PACU - hemodynamically stable.   Delay start of Pharmacological VTE agent (>24hrs) due to surgical blood loss or risk of bleeding: yes

## 2016-01-13 ENCOUNTER — Encounter: Payer: Self-pay | Admitting: Orthopedic Surgery

## 2016-01-13 LAB — BASIC METABOLIC PANEL
Anion gap: 5 (ref 5–15)
BUN: 23 mg/dL — AB (ref 6–20)
CALCIUM: 8.9 mg/dL (ref 8.9–10.3)
CO2: 27 mmol/L (ref 22–32)
CREATININE: 1.04 mg/dL — AB (ref 0.44–1.00)
Chloride: 106 mmol/L (ref 101–111)
GFR, EST NON AFRICAN AMERICAN: 52 mL/min — AB (ref >60)
Glucose, Bld: 175 mg/dL — ABNORMAL HIGH (ref 65–99)
Potassium: 3.8 mmol/L (ref 3.5–5.1)
SODIUM: 138 mmol/L (ref 135–145)

## 2016-01-13 LAB — GLUCOSE, CAPILLARY
GLUCOSE-CAPILLARY: 151 mg/dL — AB (ref 65–99)
Glucose-Capillary: 128 mg/dL — ABNORMAL HIGH (ref 65–99)
Glucose-Capillary: 95 mg/dL (ref 65–99)
Glucose-Capillary: 119 mg/dL — ABNORMAL HIGH (ref 65–99)

## 2016-01-13 LAB — CBC
HCT: 32.7 % — ABNORMAL LOW (ref 35.0–47.0)
Hemoglobin: 10.8 g/dL — ABNORMAL LOW (ref 12.0–16.0)
MCH: 31.6 pg (ref 26.0–34.0)
MCHC: 33 g/dL (ref 32.0–36.0)
MCV: 95.9 fL (ref 80.0–100.0)
PLATELETS: 233 10*3/uL (ref 150–440)
RBC: 3.41 MIL/uL — ABNORMAL LOW (ref 3.80–5.20)
RDW: 13.6 % (ref 11.5–14.5)
WBC: 10.3 10*3/uL (ref 3.6–11.0)

## 2016-01-13 MED ORDER — INFLUENZA VAC SPLIT QUAD 0.5 ML IM SUSY
0.5000 mL | PREFILLED_SYRINGE | INTRAMUSCULAR | Status: AC
Start: 2016-01-14 — End: 2016-01-14
  Administered 2016-01-14: 0.5 mL via INTRAMUSCULAR
  Filled 2016-01-13: qty 0.5

## 2016-01-13 NOTE — Progress Notes (Signed)
Clinical Social Worker (CSW) received SNF consult. PT is recommending home health. RN case manager is aware of above. Please reconsult if future social work needs arise. CSW signing off.   Achille Xiang, LCSW [phone] 

## 2016-01-13 NOTE — Care Management Note (Signed)
Case Management Note  Patient Details  Name: Leslie Weber MRN: [id number] Date of Birth: February 09, 1941  Subjective/Objective:    POD # 1 s/p right TKA. Met with patient and spouse at bedside. Offered choice of home health agency. Referral called to [facility]. Has a walker, BSC, elevated toilet seat and shower chair at home. No DME needed. Uses [facility] in [place] [phone]. Called Lovenox 40 mg # 14, no refills. Spouse will be caregiver.            Action/Plan: [facility] for home PT. Lovenox called in. No DME needed.   Expected Discharge Date:  01/14/16               Expected Discharge Plan:  [place]  In-House Referral:     Discharge planning Services  CM Consult  Post Acute Care Choice:  Home Health Choice offered to:  Patient  DME Arranged:    DME Agency:     HH Arranged:  PT [place]:  [facility] (now [facility])  Status of Service:  In process, will continue to follow  If discussed at Long Length of Stay Meetings, dates discussed:    Additional Comments:  Jolly Mango, RN 01/13/2016, 12:13 PM

## 2016-01-13 NOTE — Evaluation (Signed)
Physical Therapy Evaluation Patient Details Name: Leslie Weber MRN: [id number] DOB: 1940/09/15 Today's Date: 01/13/2016   History of Present Illness  75 y/o female post R total knee replacement (10/24).    Clinical Impression  Pt did very well with first post-op PT session.  She is able to ambulate well with good confidence and safety, achieved >90 degrees of flexion (with full TKE) and was able to do SLRs.  Pt had no pain at rest and was not pain limited with exercises or ROM acts.  Overall but showed good function and motivation and should be able to go home with HHPT.      Follow Up Recommendations Home health PT    Equipment Recommendations       Recommendations for Other Services       Precautions / Restrictions Precautions Precautions: Fall Restrictions Weight Bearing Restrictions: Yes RLE Weight Bearing: Weight bearing as tolerated      Mobility  Bed Mobility Overal bed mobility: Modified Independent             General bed mobility comments: Pt shows good effort getting to EOB, able to rise to sitting and scoot to EOB w/o direct assist  Transfers Overall transfer level: Modified independent Equipment used: Rolling walker (2 wheeled)             General transfer comment: Pt able to rise with only CGA and cuing for hand and foot placement.  Pt safe and relatively confident  Ambulation/Gait Ambulation/Gait assistance: Min guard Ambulation Distance (Feet): 35 Feet Assistive device: Rolling walker (2 wheeled)       General Gait Details: Pt shows slow but consistent and relatively confident ambulation.  She does not favor the R LE excessively and was confident moving the walker during L swing phase.  Pt showed good safety and did not have excessive fatigue.   Stairs            Wheelchair Mobility    Modified Rankin (Stroke Patients Only)       Balance Overall balance assessment: Modified Independent                                            Pertinent Vitals/Pain Pain Assessment:  (no pain at rest, increases to 6/10 with ROM acts)    Home Living Family/patient expects to be discharged to:: Private residence Living Arrangements: Spouse/significant other Available Help at Discharge: Family (daughter lives just next door)   Home Access: Stairs to enter Entrance Stairs-Rails: Can reach both Secretary/administrator of Steps: 6   Home Equipment: Environmental consultant - 2 wheels (life alert)      Prior Function Level of Independence: Independent with assistive device(s)         Comments: Pt reports she would try to get out of the house as much as possible, has been limping and needing walker      Hand Dominance        Extremity/Trunk Assessment   Upper Extremity Assessment: Overall WFL for tasks assessed           Lower Extremity Assessment: Overall WFL for tasks assessed;RLE deficits/detail RLE Deficits / Details: Pt is able to do SLRs on the R and shows good overall quality of motion, but overall is doing very well per post-op expectations       Communication   Communication: No difficulties  Cognition Arousal/Alertness:  Awake/alert Behavior During Therapy: WFL for tasks assessed/performed Overall Cognitive Status: Within Functional Limits for tasks assessed                      General Comments      Exercises Total Joint Exercises Ankle Circles/Pumps: AROM;10 reps Quad Sets: Strengthening;10 reps Gluteal Sets: Strengthening;10 reps Heel Slides: 10 reps;AROM Hip ABduction/ADduction: Strengthening;10 reps Straight Leg Raises: AROM;10 reps Knee Flexion: PROM;5 reps Goniometric ROM: 0-91   Assessment/Plan    PT Assessment Patient needs continued PT services  PT Problem List Decreased strength;Decreased range of motion;Decreased activity tolerance;Decreased balance;Decreased mobility;Decreased knowledge of use of DME;Decreased safety awareness          PT Treatment  Interventions DME instruction;Gait training;Stair training;Functional mobility training;Therapeutic activities;Therapeutic exercise;Balance training    PT Goals (Current goals can be found in the Care Plan section)  Acute Rehab PT Goals Patient Stated Goal: go home PT Goal Formulation: With patient Time For Goal Achievement: 01/27/16 Potential to Achieve Goals: Good    Frequency BID   Barriers to discharge        Co-evaluation               End of Session Equipment Utilized During Treatment: Gait belt Activity Tolerance: Patient tolerated treatment well Patient left: with call bell/phone within reach;with chair alarm set Nurse Communication: Mobility status         Time: [id number] PT Time Calculation (min) (ACUTE ONLY): 31 min   Charges:   PT Evaluation $PT Eval Low Complexity: 1 Procedure PT Treatments $Therapeutic Exercise: 8-22 mins   PT G Codes:        Malachi ProGalen R Kamara Weber, DPT 01/13/2016, 10:42 AM

## 2016-01-13 NOTE — Progress Notes (Signed)
Physical Therapy Treatment Patient Details Name: Leslie Weber MRN: [id number] DOB: [birth year] Today's Date: 01/13/2016    History of Present Illness Pt. is a 75 y.o. Female who was admitted to [facility] for a Right TKR.    PT Comments    Pt continues to do very well and is at or beyond typical POD1 (and POD2) milestones.  She has very little pain with all acts, "easily" has 0-90 ROM, shows good confidence with SLR, ambulates with consistent and relatively confident gait and generally is making great gains.  She did need some minimal assist to get herself back into bed, but showed great effort with the transition.  Pt eager to work with PT and has done quite well thus far.  Follow Up Recommendations  Home health PT     Equipment Recommendations       Recommendations for Other Services       Precautions / Restrictions Precautions Precautions: Fall Restrictions RLE Weight Bearing: Weight bearing as tolerated    Mobility  Bed Mobility Overal bed mobility: Needs Assistance Bed Mobility: Sit to Supine       Sit to supine: Min assist   General bed mobility comments: Pt shows great effort trying to get R LE back into bed, she did need some help with getting it fully up and to position herself appropriately in the bed  Transfers Overall transfer level: Modified independent Equipment used: Rolling walker (2 wheeled)             General transfer comment: Pt needs some minimal cuing for encouragement and set up, but ultimately was able to rise w/o direct assist  Ambulation/Gait Ambulation/Gait assistance: Supervision Ambulation Distance (Feet): 75 Feet Assistive device: Rolling walker (2 wheeled)       General Gait Details: Pt did well with ambulation showing consistent and steady cadence and overall has no safety issues and though she has some fatigue with increased ambulation she ultimately did well with no limp on R, minimal reliance on the walker and generally showing  great POD1 gait.   Stairs            Wheelchair Mobility    Modified Rankin (Stroke Patients Only)       Balance                                    Cognition Arousal/Alertness: Awake/alert Behavior During Therapy: WFL for tasks assessed/performed Overall Cognitive Status: Within Functional Limits for tasks assessed                      Exercises Total Joint Exercises Ankle Circles/Pumps: AROM;10 reps Quad Sets: Strengthening;10 reps Gluteal Sets: Strengthening;10 reps Short Arc Quad: Strengthening;10 reps Heel Slides: Strengthening;AROM;10 reps Hip ABduction/ADduction: Strengthening;10 reps Straight Leg Raises: AROM;Strengthening;10 reps Knee Flexion: PROM;10 reps Goniometric ROM: >90 degrees w/ minimal overpressure and minimal pain    General Comments        Pertinent Vitals/Pain Pain Assessment: 0-10 Pain Score: 0-No pain (continues to have minimal pain only during ROM acts)    Home Living                      Prior Function            PT Goals (current goals can now be found in the care plan section) Progress towards PT goals: Progressing toward goals    Frequency  BID      PT Plan Current plan remains appropriate    Co-evaluation             End of Session Equipment Utilized During Treatment: Gait belt Activity Tolerance: Patient tolerated treatment well Patient left: with bed alarm set;with call bell/phone within reach;with family/visitor present     Time: [id number] PT Time Calculation (min) (ACUTE ONLY): 32 min  Charges:  $Gait Training: 8-22 mins $Therapeutic Exercise: 8-22 mins                    G Codes:      Malachi ProGalen R Kennard Fildes , DPT  01/13/2016, 3:21 PM

## 2016-01-13 NOTE — Discharge Summary (Signed)
Physician Discharge Summary  Patient ID: Leslie Weber MRN: [id number] DOB/AGE: July 14, 1940 75 y.o.  Admit date: 01/12/2016 Discharge date: 01/14/2016  Admission Diagnoses:  PRIMARY OSTEOARTHRITIS   Discharge Diagnoses: Patient Active Problem List   Diagnosis Date Noted  . S/P total knee arthroplasty 01/12/2016  . Pulmonary arterial hypertension 11/04/2015  . Primary osteoarthritis of right knee 10/23/2015  . 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 Obesity 05/03/2014  . Peripheral neuropathy (HCC) 05/03/2014  . Spinal stenosis 05/03/2014  . Hypothyroidism 05/03/2014  . Hypertension 05/03/2014  . Hyperlipidemia, unspecified 05/03/2014  . Coronary artery disease 05/03/2014  . DDD (degenerative disc disease), lumbar 01/08/2014    Past Medical History:  Diagnosis Date  . Arthritis   . Depression   . Diabetes mellitus without complication (HCC)   . Fever due to malaria    75 years old  . Fibromyalgia   . GERD (gastroesophageal reflux disease)   . History of hiatal hernia   . Hypertension   . Hypothyroidism   . Neuromuscular disorder (HCC)    Bells palsy left side of face  . Neuropathy of both feet   . Pneumonia 1997   history of walking pneumonia     Transfusion: No transfusions given doing this admission   Consultants (if any):  no consultations on this admission  Discharged Condition: Improved  Hospital Course: Leslie Weber is an 75 y.o. female who was admitted 01/12/2016 with a diagnosis of degenerative arthrosis right knee and went to the operating room on 01/12/2016 and underwent the above named procedures.    Surgeries:Procedure(s): COMPUTER ASSISTED TOTAL KNEE ARTHROPLASTY on 01/12/2016  PRE-OPERATIVE DIAGNOSIS: Degenerative arthrosis of the right knee, primary  POST-OPERATIVE DIAGNOSIS:  Same  PROCEDURE:  Right total knee arthroplasty using computer-assisted navigation  SURGEON:  Jena Gauss. M.D.  ASSISTANT:  Van Clines,  PA (present and scrubbed throughout the case, critical for assistance with exposure, retraction, instrumentation, and closure)  ANESTHESIA: general  ESTIMATED BLOOD LOSS: 50 mL  FLUIDS REPLACED: 1700 mL of crystalloid  TOURNIQUET TIME: 82 minutes  DRAINS: 2 medium drains to a reinfusion system  SOFT TISSUE RELEASES: Anterior cruciate ligament, posterior cruciate ligament, deep medial collateral ligament, patellofemoral ligament  IMPLANTS UTILIZED: DePuy Attune size 6N posterior stabilized femoral component (cemented), size 5 rotating platform tibial component (cemented), 35 mm medialized dome patella (cemented), and a 6 mm stabilized rotating platform polyethylene insert.  INDICATIONS FOR SURGERY: Leslie Weber is a 75 y.o. year old female with a long history of progressive knee pain. X-rays demonstrated severe degenerative changes in tricompartmental fashion. The patient had not seen any significant improvement despite conservative nonsurgical intervention. After discussion of the risks and benefits of surgical intervention, the patient expressed understanding of the risks benefits and agree with plans for total knee arthroplasty.   The risks, benefits, and alternatives were discussed at length including but not limited to the risks of infection, bleeding, nerve injury, stiffness, blood clots, the need for revision surgery, cardiopulmonary complications, among others, and they were willing to proceed. Patient tolerated the surgery well. No complications .Patient was taken to PACU where she was stabilized and then transferred to the orthopedic floor.  Patient started on Lovenox 30 mg q 12 hrs. Foot pumps applied bilaterally at 80 mm hgb. Heels elevated off bed with rolled towels. No evidence of DVT. Calves non tender. Negative Homan. Physical therapy started on day #1 for gait training and transfer with OT starting on  day #1  for ADL and assisted devices. Patient has done well with  therapy. Ambulated 200 feet upon being discharged.  Patient's IV and Foley were discontinued on day #1 with the Hemovac being discontinued on day #2 along with dressing change.   She was given perioperative antibiotics:  Anti-infectives    Start     Dose/Rate Route Frequency Ordered Stop   01/12/16 1600  ceFAZolin (ANCEF) IVPB 2g/100 mL premix     2 g 200 mL/hr over 30 Minutes Intravenous Every 6 hours 01/12/16 1542 01/13/16 1614   01/12/16 0815  ceFAZolin (ANCEF) 2-4 GM/100ML-% IVPB    Comments:  Letta Pate: cabinet override      01/12/16 0815 01/12/16 1130   01/12/16 0315  ceFAZolin (ANCEF) IVPB 2g/100 mL premix     2 g 200 mL/hr over 30 Minutes Intravenous On call to O.R. 01/12/16 0315 01/12/16 1130    .  She was fitted with AV 1 compression foot pump devices, instructed on heel pumps, early ambulation, and TED stockings bilaterally for DVT prophylaxis.  She benefited maximally from the hospital stay and there were no complications.    Recent vital signs:  Vitals:   01/13/16 0721 01/13/16 0729  BP: (!) 114/45 (!) 96/28  Pulse: 83 78  Resp: 16   Temp:      Recent laboratory studies:  Lab Results  Component Value Date   HGB 10.8 (L) 01/13/2016   HGB 12.1 12/31/2015   Lab Results  Component Value Date   WBC 10.3 01/13/2016   PLT 233 01/13/2016   Lab Results  Component Value Date   INR 0.95 12/31/2015   Lab Results  Component Value Date   NA 138 01/13/2016   K 3.8 01/13/2016   CL 106 01/13/2016   CO2 27 01/13/2016   BUN 23 (H) 01/13/2016   CREATININE 1.04 (H) 01/13/2016   GLUCOSE 175 (H) 01/13/2016    Discharge Medications:     Medication List    TAKE these medications   atorvastatin 40 MG tablet Commonly known as:  LIPITOR Take 40 mg by mouth every morning.   CALCIUM 600+D 600-800 MG-UNIT Tabs Generic drug:  Calcium Carb-Cholecalciferol Take 1 Dose by mouth 2 times daily at 12 noon and 4 pm.   celecoxib 200 MG capsule Commonly known as:   CELEBREX Take 200 mg by mouth 2 (two) times daily.   CENTRUM WOMEN PO Take 1 tablet by mouth every morning.   citalopram 20 MG tablet Commonly known as:  CELEXA Take 20 mg by mouth every morning.   enoxaparin 40 MG/0.4ML injection Commonly known as:  LOVENOX Inject 0.4 mLs (40 mg total) into the skin daily.   Ferrous Sulfate Dried 45 MG Tbcr Take 1 tablet by mouth every morning.   gabapentin 300 MG capsule Commonly known as:  NEURONTIN Take 300 mg by mouth 3 (three) times daily. 2 Capsules 3 times a day.   levothyroxine 50 MCG tablet Commonly known as:  SYNTHROID, LEVOTHROID Take 50 mcg by mouth daily before breakfast.   nortriptyline 75 MG capsule Commonly known as:  PAMELOR Take 75 mg by mouth at bedtime.   pantoprazole 40 MG tablet Commonly known as:  PROTONIX Take 40 mg by mouth daily before breakfast.   VICTOZA 18 MG/3ML Sopn Generic drug:  liraglutide Inject 0.6 mg into the skin every morning.       Diagnostic Studies: Dg Knee Right Port  Result Date: 01/12/2016 CLINICAL DATA:  Total knee replacement EXAM: PORTABLE RIGHT KNEE -  1-2 VIEW COMPARISON:  01/12/2016 FINDINGS: Right knee replacement in satisfactory position and alignment. No fracture or complication. Gas in the joint with drains in the joint. IMPRESSION: Satisfactory right knee replacement. Electronically Signed   By: Marlan Palauharles  Clark M.D.   On: 01/12/2016 14:55   Mm Digital Screening Bilateral  Result Date: 01/01/2016 CLINICAL DATA:  Screening. EXAM: DIGITAL SCREENING BILATERAL MAMMOGRAM WITH CAD COMPARISON:  Previous exam(s). ACR Breast Density Category b: There are scattered areas of fibroglandular density. FINDINGS: There are no findings suspicious for malignancy. Images were processed with CAD. IMPRESSION: No mammographic evidence of malignancy. A result letter of this screening mammogram will be mailed directly to the patient. RECOMMENDATION: Screening mammogram in one year. (Code:SM-B-01Y) BI-RADS  CATEGORY  1: Negative. Electronically Signed   By: Bary RichardStan  Maynard M.D.   On: 01/01/2016 11:49    Disposition: Final discharge disposition not confirmed    Follow-up Information    Jemaine Prokop R., PA On 01/27/2016.   Specialty:  Physician Assistant Why:  at 1:45pm Contact information: [address] [facility] [place] [address] [phone]        Donato HeinzHOOTEN,JAMES P, MD On 02/24/2016.   Specialty:  Orthopedic Surgery Why:  at 9:45am Contact information: [address] [facility] [place] [id number] [phone]            Signed: Tera PartridgeWOLFE,Layla Kesling R. 01/13/2016, 7:40 AM

## 2016-01-13 NOTE — Progress Notes (Signed)
ORTHOPAEDICS PROGRESS NOTE  PATIENT NAME: Leslie Weber I Picchi DOB: February 05, 1941  MRN: [id number]  POD # 1: Right total knee arthroplasty  Subjective: The patient rested well last night. Pain has been well-controlled. She denies any nausea. She was able to sit at the side of the bed and ankle last night as per nursing.  Objective: Vital signs in last 24 hours: Temp:  [97.5 F (36.4 C)-99 F (37.2 C)] 97.5 F (36.4 C) (10/24 0358) Pulse Rate:  [74-93] 78 (10/24 0607) Resp:  [11-19] 16 (10/24 0358) BP: (99-123)/(31-67) 114/55 (10/24 0607) SpO2:  [93 %-100 %] 98 % (10/24 0607) FiO2 (%):  [32 %] 32 % (10/23 1543) Weight:  [112.5 kg (248 lb)-113.9 kg (251 lb 1.6 oz)] 113.9 kg (251 lb 1.6 oz) (10/23 1750)  Intake/Output from previous day: 10/23 0701 - 10/24 0700 In: 3495 [I.V.:3095; IV Piggyback:400] Out: 1740 [Urine:1500; Drains:190; Blood:50]   Recent Labs  01/13/16 0304  WBC 10.3  HGB 10.8*  HCT 32.7*  PLT 233  K 3.8  CL 106  CO2 27  BUN 23*  CREATININE 1.04*  GLUCOSE 175*  CALCIUM 8.9    EXAM General: Well-developed well-nourished female seen in no acute distress. Lungs: clear to auscultation Cardiac: normal rate, regular rhythm, normal S1, S2, no murmurs, rubs, clicks or gallops, normal rate and regular rhythm Abdomen: Soft, nontender, nondistended. Quite bowel sounds are noted. Right lower extremity: Dressing is dry and intact. Bone foam is in place. The patient is able to perform an independent straight leg raise. Homans test is negative. Neurologic: Awake, alert, and oriented. Sensory and motor function are grossly intact.  Assessment: Status post right total knee arthroplasty Acute blood loss anemia secondary to surgery  Secondary diagnoses: Peripheral neuropathy Hypothyroidism Hypertension Gastroesophageal reflux disease Fibromyalgia Diabetes Depression  Plan: Today's goal were reviewed with the patient. Begin physical therapy and occupational therapy as  per total knee arthroplasty rehabilitation protocol. Repeat labs in the morning. DVT Prophylaxis - Lovenox, Foot Pumps and TED hose  James P. Angie FavaHooten, Jr. M.D.

## 2016-01-13 NOTE — Evaluation (Signed)
Occupational Therapy Evaluation Patient Details Name: MARESSA APOLLO MRN: [id number] DOB: 03/01/1941 Today's Date: 01/13/2016    History of Present Illness Pt. is a 75 y.o. Female who was admitted to [facility] for a Right TKR.   Clinical Impression   Pt. Is a 75 y.o. Female who was admitted for a Right TKR. Pt presents with limited ROM, weakness, and impaired functional mobility which hinder her ability to complete ADL and IADL tasks. Pt. could benefit from skilled OT services to review A/E use for LE ADLs, to review necessary home modifications, and to improve functional mobility for ADL/IADLs in order to work towards regaining Independence with ADL/IADLs. Pt. Plans to return home with her husband upon discharge.    Follow Up Recommendations  No OT follow up    Equipment Recommendations       Recommendations for Other Services PT consult     Precautions / Restrictions Precautions Precautions: Fall Restrictions Weight Bearing Restrictions: Yes RLE Weight Bearing: Weight bearing as tolerated         Balance Overall balance assessment: Modified Independent                                          ADL Overall ADL's : Needs assistance/impaired Eating/Feeding: Set up   Grooming: Set up               Lower Body Dressing: Moderate assistance                 General ADL Comments: Pt. education was provided about A/E use for LE ADLs.     Vision     Perception     Praxis      Pertinent Vitals/Pain Pain Assessment: 0-10 Pain Score: 0-No pain     Hand Dominance Right   Extremity/Trunk Assessment Upper Extremity Assessment Upper Extremity Assessment: Overall WFL for tasks assessed         Communication Communication Communication: No difficulties   Cognition Arousal/Alertness: Awake/alert Behavior During Therapy: WFL for tasks assessed/performed Overall Cognitive Status: Within Functional Limits for tasks assessed                      General Comments       Exercises     Shoulder Instructions      Home Living Family/patient expects to be discharged to:: Private residence Living Arrangements: Alone;Spouse/significant other Available Help at Discharge: Family   Home Access: Stairs to enter Entrance Stairs-Number of Steps: 6 Entrance Stairs-Rails: Can reach both Home Layout: Multi-level;Able to live on main level with bedroom/bathroom     Bathroom Shower/Tub: Walk-in shower         Home Equipment: Shower seat - built in          Prior Functioning/Environment Level of Independence: Independent with assistive device(s)        Comments: Pt reports she would try to get out of the house as much as possible, has been limping and needing walker         OT Problem List: Decreased strength;Decreased knowledge of use of DME or AE;Decreased activity tolerance;Decreased range of motion   OT Treatment/Interventions: Self-care/ADL training;Therapeutic exercise;Therapeutic activities;DME and/or AE instruction;Patient/family education    OT Goals(Current goals can be found in the care plan section) Acute Rehab OT Goals Patient Stated Goal: To return home OT Goal Formulation: With patient Potential to  Achieve Goals: Good  OT Frequency: Min 1X/week   Barriers to D/C:            Co-evaluation              End of Session Equipment Utilized During Treatment: Gait belt  Activity Tolerance: Patient tolerated treatment well Patient left: in chair;with call bell/phone within reach;with chair alarm set   Time: 1001-1030 OT Time Calculation (min): 29 min Charges:  OT General Charges $OT Visit: 1 Procedure OT Evaluation $OT Eval Moderate Complexity: 1 Procedure OT Treatments $Self Care/Home Management : 8-22 mins G-Codes:    Olegario MessierElaine Frederik Standley, MS, OTR/L 01/13/2016, 11:07 AM

## 2016-01-13 NOTE — Progress Notes (Signed)
Patient is progressing well. Catheter removed last night and patient is voiding. IV fluids stopped. Taking POs well. Using walker for ambulation and tolerating very well.

## 2016-01-13 NOTE — Discharge Instructions (Signed)

## 2016-01-14 LAB — BASIC METABOLIC PANEL
Anion gap: 5 (ref 5–15)
BUN: 23 mg/dL — ABNORMAL HIGH (ref 6–20)
CHLORIDE: 107 mmol/L (ref 101–111)
CO2: 28 mmol/L (ref 22–32)
CREATININE: 1.02 mg/dL — AB (ref 0.44–1.00)
Calcium: 9.5 mg/dL (ref 8.9–10.3)
GFR, EST NON AFRICAN AMERICAN: 53 mL/min — AB (ref >60)
Glucose, Bld: 114 mg/dL — ABNORMAL HIGH (ref 65–99)
POTASSIUM: 3.5 mmol/L (ref 3.5–5.1)
SODIUM: 140 mmol/L (ref 135–145)

## 2016-01-14 LAB — GLUCOSE, CAPILLARY
GLUCOSE-CAPILLARY: 99 mg/dL (ref 65–99)
GLUCOSE-CAPILLARY: 131 mg/dL — AB (ref 65–99)
Glucose-Capillary: 126 mg/dL — ABNORMAL HIGH (ref 65–99)

## 2016-01-14 LAB — CBC
HCT: 31.2 % — ABNORMAL LOW (ref 35.0–47.0)
HEMOGLOBIN: 10.1 g/dL — AB (ref 12.0–16.0)
MCH: 31.2 pg (ref 26.0–34.0)
MCHC: 32.5 g/dL (ref 32.0–36.0)
MCV: 96 fL (ref 80.0–100.0)
PLATELETS: 222 10*3/uL (ref 150–440)
RBC: 3.25 MIL/uL — AB (ref 3.80–5.20)
RDW: 13.8 % (ref 11.5–14.5)
WBC: 11.1 10*3/uL — ABNORMAL HIGH (ref 3.6–11.0)

## 2016-01-14 MED ORDER — OXYCODONE HCL 5 MG PO TABS: 5.0000 mg | ORAL_TABLET | ORAL | 0 refills | Status: DC | PRN

## 2016-01-14 MED ORDER — TRAMADOL HCL 50 MG PO TABS: 50.0000 mg | ORAL_TABLET | ORAL | 1 refills | Status: DC | PRN

## 2016-01-14 MED ORDER — ENOXAPARIN SODIUM 40 MG/0.4ML ~~LOC~~ SOLN: 40.0000 mg | SUBCUTANEOUS | 0 refills | Status: DC

## 2016-01-14 MED ORDER — LACTULOSE 10 GM/15ML PO SOLN
10.0000 g | Freq: Two times a day (BID) | ORAL | Status: DC | PRN
  Administered 2016-01-14: 10 g via ORAL
  Filled 2016-01-14: qty 30

## 2016-01-14 NOTE — Care Management (Addendum)
Spoke with Alvino ChapelEllen, patient daughter. She voiced concerns that patient has no motivation to be at home alone and the patients husband is not at home enough to care for her. Daughter states he is in and out of the house all day. Explained PT recommendations of home health and Medicare guidelines. Encouraged Alvino Chapelllen to be available to watch her work with PT today to receive reassurance that patient is doing well. Explained to her that PT will have patient using the stairs, walking and transferring well prior to discharge. She had concerns regarding pain management. Encouraged daughter to fill a pill box with her scheduled pain medications for the first few days. She verbalized understanding.

## 2016-01-14 NOTE — Care Management Note (Signed)
Case Management Note  Patient Details  Name: Leslie Weber MRN: [id number] Date of Birth: 02/09/1941  Subjective/Objective:   Discharge today with home health.                 Action/Plan: [facility] notified of dsicharge. Lovenox $33.84. Patient updated.   Expected Discharge Date:  01/14/16               Expected Discharge Plan:  Home w Home Health Services  In-House Referral:     Discharge planning Services  CM Consult  Post Acute Care Choice:  Home Health Choice offered to:  Patient  DME Arranged:    DME Agency:     HH Arranged:  [facility]  Status of Service:  In process, will continue to follow  If discussed at Long Length of Stay Meetings, dates discussed:    Additional Comments:  Marily MemosLisa M Taaj Hurlbut, RN 01/14/2016, 9:02 AM

## 2016-01-14 NOTE — Care Management Important Message (Signed)
Important Message  Patient Details  Name: Leslie Weber MRN: [id number] Date of Birth: January 15, 1941   Medicare Important Message Given:  Yes    Marily MemosLisa M Kalei Mckillop, RN 01/14/2016, 9:10 AM

## 2016-01-14 NOTE — Progress Notes (Signed)
Physical Therapy Treatment Patient Details Name: Leslie Weber MRN: [id number] DOB: 05/27/1940 Today's Date: 01/14/2016    History of Present Illness Pt. is a 75 y.o. Female who was admitted to [facility] for a Right TKR.    PT Comments    Pt agreeable to PT; denies pain in surgical leg, mild in non surgical leg. Pt progressing well; however, does require education on proper chair approach and hand placement with sit to/from stand transfer. Pt improves and able to demonstrate correctly without cueing on 4th try throughout session. Family present during session and educated as well on all mobility, safety and exercise/activity expectations and progress. Pt ambulates well with good reciprocal pattern; pt does note mild "wobbly" feeling in Bilateral lower extremities with stand; encouraged pre gait activity of weight shifting with quad sets. It is also noted pt's rolling walker from home is old and somewhat unsteady as well as ill fitting for her height at the lowest position. Spoke with family then CM regarding ordering bariatric rolling walker for her height through insurance. Pt/family and CM agreeable. Family notified post discussion with CM that it will be ordered. Pt demonstrates ability to ascend/descend 6 steps safely with Min guard. Ambulation distances limited today for the purpose of conserving pt's energy for returning home today. Pt demonstrating household distances safely. Pt has good range in right knee; emphasized importance on continuing stretch in both flexion and extension to continue progression and maintain neutral extension. Also educated on open versus closed knee joint position to defer joint swelling. Pt to discharge home today with family.   Follow Up Recommendations  Home health PT     Equipment Recommendations  Rolling walker with 5" wheels;Other (comment) (Bariatric)    Recommendations for Other Services       Precautions / Restrictions Precautions Precautions:  Fall Restrictions Weight Bearing Restrictions: Yes RLE Weight Bearing: Weight bearing as tolerated    Mobility  Bed Mobility Overal bed mobility: Needs Assistance Bed Mobility: Supine to Sit     Supine to sit: Min assist     General bed mobility comments: Assit for trunk elevation  Transfers Overall transfer level: Needs assistance Equipment used: Rolling walker (2 wheeled) Transfers: Sit to/from Stand Sit to Stand: Min guard         General transfer comment: Needs cueing for correct/safe hand placement and appropriate chair approach. Last chair appoach t performs correctly without verbal cues. Peformed 4x  Ambulation/Gait Ambulation/Gait assistance: Min guard Ambulation Distance (Feet): 25 Feet (3x; defer further d/t stair work; E conservation for  home) Assistive device: Rolling walker (2 wheeled) Gait Pattern/deviations: Step-through pattern Gait velocity: decreased Gait velocity interpretation: Below normal speed for age/gender General Gait Details: Ambulates well; educated to perform pre gait wt shift to prepare, as pt notes feeling mildly "wobbly" on feet with intial stands. It is noted that pt rw from home is ill fitting and old/somewhat unstable    Stairs Stairs: Yes Stairs assistance: Min guard Stair Management: Two rails;Step to pattern;Forwards Number of Stairs: 6 General stair comments: Education on more forward hands with descend to allow for greater support once descended. Verbally edcated on performing sideways; pt prefers forward. Family present for education as well.   Wheelchair Mobility    Modified Rankin (Stroke Patients Only)       Balance Overall balance assessment: Needs assistance Sitting-balance support: Feet supported Sitting balance-Leahy Scale: Good     Standing balance support: Bilateral upper extremity supported Standing balance-Leahy Scale: Fair  Cognition Arousal/Alertness: Awake/alert Behavior  During Therapy: WFL for tasks assessed/performed Overall Cognitive Status: Within Functional Limits for tasks assessed                      Exercises Total Joint Exercises Quad Sets: Strengthening;Both;10 reps;Standing (performed 3x prior to gait) Knee Flexion: AROM;Right;10 reps;Seated (3 positions each rep with 10 sec hold for stretch) Goniometric ROM: 0-97 Other Exercises Other Exercises: Education/questions answered for daughter regarding home concerns. Spouse present as well. Educated on proactive safety measures. Pt/family understand.     General Comments        Pertinent Vitals/Pain Pain Assessment: No/denies pain (Denies on surgical side; mild on non surgical side)    Home Living                      Prior Function            PT Goals (current goals can now be found in the care plan section) Progress towards PT goals: Progressing toward goals    Frequency    BID      PT Plan Current plan remains appropriate    Co-evaluation             End of Session           Time: [id number] PT Time Calculation (min) (ACUTE ONLY): 41 min  Charges:  $Gait Training: 23-37 mins $Therapeutic Exercise: 8-22 mins                    G Codes:      Scot DockHeidi E Barnes, PTA 01/14/2016, 11:15 AM

## 2016-01-14 NOTE — Progress Notes (Signed)
   Subjective: 2 Days Post-Op Procedure(s) (LRB): COMPUTER ASSISTED TOTAL KNEE ARTHROPLASTY (Right) Patient reports pain as 2 on 0-10 scale.   Patient is well, and has had no acute complaints or problems Continue with physical therapy today.  Plan is to go Home after hospital stay. no nausea and no vomiting Patient denies any chest pains or shortness of breath. Objective: Vital signs in last 24 hours: Temp:  [97.7 F (36.5 C)-98.8 F (37.1 C)] 98.8 F (37.1 C) (10/25 0441) Pulse Rate:  [78-89] 81 (10/25 0441) Resp:  [16-19] 19 (10/25 0441) BP: (96-129)/(28-54) 104/52 (10/25 0441) SpO2:  [89 %-99 %] 90 % (10/25 0441) well approximated incision Heels are non tender and elevated off the bed using rolled towels Intake/Output from previous day: 10/24 0701 - 10/25 0700 In: 360 [P.O.:360] Out: 130 [Drains:130] Intake/Output this shift: Total I/O In: -  Out: 130 [Drains:130]   Recent Labs  01/13/16 0304 01/14/16 0337  HGB 10.8* 10.1*    Recent Labs  01/13/16 0304 01/14/16 0337  WBC 10.3 11.1*  RBC 3.41* 3.25*  HCT 32.7* 31.2*  PLT 233 222    Recent Labs  01/13/16 0304 01/14/16 0337  NA 138 140  K 3.8 3.5  CL 106 107  CO2 27 28  BUN 23* 23*  CREATININE 1.04* 1.02*  GLUCOSE 175* 114*  CALCIUM 8.9 9.5   No results for input(s): LABPT, INR in the last 72 hours.  EXAM General - Patient is Alert, Appropriate and Oriented Extremity - Neurologically intact Neurovascular intact Sensation intact distally Intact pulses distally Dorsiflexion/Plantar flexion intact No cellulitis present Compartment soft Dressing - dressing C/D/I Motor Function - intact, moving foot and toes well on exam.    Past Medical History:  Diagnosis Date  . Arthritis   . Depression   . Diabetes mellitus without complication (HCC)   . Fever due to malaria    75 years old  . Fibromyalgia   . GERD (gastroesophageal reflux disease)   . History of hiatal hernia   . Hypertension   .  Hypothyroidism   . Neuromuscular disorder (HCC)    Bells palsy left side of face  . Neuropathy of both feet   . Pneumonia 1997   history of walking pneumonia    Assessment/Plan: 2 Days Post-Op Procedure(s) (LRB): COMPUTER ASSISTED TOTAL KNEE ARTHROPLASTY (Right) Active Problems:   Primary osteoarthritis of right knee   S/P total knee arthroplasty  Estimated body mass index is 45.93 kg/m as calculated from the following:   Height as of this encounter: 5\' 2"  (1.575 m).   Weight as of this encounter: 113.9 kg (251 lb 1.6 oz). Up with therapy Discharge home with home health  Labs: Were reviewed DVT Prophylaxis - Lovenox, Foot Pumps and TED hose Weight-Bearing as tolerated to right leg Hemovac was discontinued today Patient needs a bowel movement Plan to discharge today she does the lap around station, steps and has a bowel movement  Jon R. [facility] PA [facility] 01/14/2016, 6:41 AM

## 2016-01-14 NOTE — Care Management (Signed)
PT recommending a walker, patient walker is old and not safe. Requested a walker from [facility]

## 2016-01-14 NOTE — Progress Notes (Signed)
Patient was discharged home with home health via family. IVs removed with caths intact. Dressing, meds, scripts, last dose given were reviewed. Home Insulin returned to family. Allowed time for questions.

## 2016-01-15 ENCOUNTER — Emergency Department: Payer: Medicare Other

## 2016-01-15 ENCOUNTER — Encounter: Payer: Self-pay | Admitting: Emergency Medicine

## 2016-01-15 ENCOUNTER — Observation Stay
Admission: EM | Admit: 2016-01-15 | Discharge: 2016-01-16 | Disposition: A | Discharge status: Skilled Nursing Facility | Payer: Medicare Other | Attending: Internal Medicine | Admitting: Internal Medicine

## 2016-01-15 DIAGNOSIS — F329 Major depressive disorder, single episode, unspecified: Secondary | ICD-10-CM | POA: Insufficient documentation

## 2016-01-15 DIAGNOSIS — M1711 Unilateral primary osteoarthritis, right knee: Secondary | ICD-10-CM | POA: Diagnosis not present

## 2016-01-15 DIAGNOSIS — W06XXXA Fall from bed, initial encounter: Secondary | ICD-10-CM | POA: Insufficient documentation

## 2016-01-15 DIAGNOSIS — M5136 Other intervertebral disc degeneration, lumbar region: Secondary | ICD-10-CM | POA: Diagnosis not present

## 2016-01-15 DIAGNOSIS — I2510 Atherosclerotic heart disease of native coronary artery without angina pectoris: Secondary | ICD-10-CM | POA: Diagnosis not present

## 2016-01-15 DIAGNOSIS — E785 Hyperlipidemia, unspecified: Secondary | ICD-10-CM | POA: Insufficient documentation

## 2016-01-15 DIAGNOSIS — Y998 Other external cause status: Secondary | ICD-10-CM | POA: Insufficient documentation

## 2016-01-15 DIAGNOSIS — E1121 Type 2 diabetes mellitus with diabetic nephropathy: Secondary | ICD-10-CM | POA: Insufficient documentation

## 2016-01-15 DIAGNOSIS — R296 Repeated falls: Secondary | ICD-10-CM | POA: Diagnosis not present

## 2016-01-15 DIAGNOSIS — Y9389 Activity, other specified: Secondary | ICD-10-CM | POA: Diagnosis not present

## 2016-01-15 DIAGNOSIS — I2721 Secondary pulmonary arterial hypertension: Secondary | ICD-10-CM | POA: Diagnosis not present

## 2016-01-15 DIAGNOSIS — R4182 Altered mental status, unspecified: Secondary | ICD-10-CM | POA: Diagnosis present

## 2016-01-15 DIAGNOSIS — M4800 Spinal stenosis, site unspecified: Secondary | ICD-10-CM | POA: Diagnosis not present

## 2016-01-15 DIAGNOSIS — K219 Gastro-esophageal reflux disease without esophagitis: Secondary | ICD-10-CM | POA: Insufficient documentation

## 2016-01-15 DIAGNOSIS — Z6841 Body Mass Index (BMI) 40.0 and over, adult: Secondary | ICD-10-CM | POA: Insufficient documentation

## 2016-01-15 DIAGNOSIS — E860 Dehydration: Secondary | ICD-10-CM | POA: Insufficient documentation

## 2016-01-15 DIAGNOSIS — R918 Other nonspecific abnormal finding of lung field: Secondary | ICD-10-CM | POA: Insufficient documentation

## 2016-01-15 DIAGNOSIS — E039 Hypothyroidism, unspecified: Secondary | ICD-10-CM | POA: Diagnosis not present

## 2016-01-15 DIAGNOSIS — E1140 Type 2 diabetes mellitus with diabetic neuropathy, unspecified: Secondary | ICD-10-CM | POA: Insufficient documentation

## 2016-01-15 DIAGNOSIS — K449 Diaphragmatic hernia without obstruction or gangrene: Secondary | ICD-10-CM | POA: Diagnosis not present

## 2016-01-15 DIAGNOSIS — E669 Obesity, unspecified: Secondary | ICD-10-CM | POA: Diagnosis not present

## 2016-01-15 DIAGNOSIS — Z794 Long term (current) use of insulin: Secondary | ICD-10-CM | POA: Insufficient documentation

## 2016-01-15 DIAGNOSIS — N179 Acute kidney failure, unspecified: Secondary | ICD-10-CM | POA: Diagnosis not present

## 2016-01-15 DIAGNOSIS — R0902 Hypoxemia: Secondary | ICD-10-CM | POA: Insufficient documentation

## 2016-01-15 DIAGNOSIS — R32 Unspecified urinary incontinence: Secondary | ICD-10-CM | POA: Diagnosis not present

## 2016-01-15 DIAGNOSIS — N390 Urinary tract infection, site not specified: Secondary | ICD-10-CM | POA: Diagnosis not present

## 2016-01-15 DIAGNOSIS — I10 Essential (primary) hypertension: Secondary | ICD-10-CM | POA: Insufficient documentation

## 2016-01-15 DIAGNOSIS — M7989 Other specified soft tissue disorders: Secondary | ICD-10-CM | POA: Insufficient documentation

## 2016-01-15 DIAGNOSIS — M6281 Muscle weakness (generalized): Secondary | ICD-10-CM

## 2016-01-15 DIAGNOSIS — M797 Fibromyalgia: Secondary | ICD-10-CM | POA: Insufficient documentation

## 2016-01-15 DIAGNOSIS — Y92013 Bedroom of single-family (private) house as the place of occurrence of the external cause: Secondary | ICD-10-CM | POA: Insufficient documentation

## 2016-01-15 DIAGNOSIS — Z96651 Presence of right artificial knee joint: Secondary | ICD-10-CM | POA: Insufficient documentation

## 2016-01-15 DIAGNOSIS — Z90710 Acquired absence of both cervix and uterus: Secondary | ICD-10-CM | POA: Insufficient documentation

## 2016-01-15 DIAGNOSIS — R262 Difficulty in walking, not elsewhere classified: Secondary | ICD-10-CM

## 2016-01-15 LAB — URINALYSIS COMPLETE WITH MICROSCOPIC (ARMC ONLY)
BILIRUBIN URINE: NEGATIVE
Bacteria, UA: NONE SEEN
Glucose, UA: NEGATIVE mg/dL
Hgb urine dipstick: NEGATIVE
Ketones, ur: NEGATIVE mg/dL
Nitrite: NEGATIVE
PH: 5 (ref 5.0–8.0)
PROTEIN: 30 mg/dL — AB
Specific Gravity, Urine: 1.018 (ref 1.005–1.030)

## 2016-01-15 LAB — BASIC METABOLIC PANEL
ANION GAP: 5 (ref 5–15)
BUN: 32 mg/dL — AB (ref 6–20)
CALCIUM: 9.4 mg/dL (ref 8.9–10.3)
CO2: 29 mmol/L (ref 22–32)
CREATININE: 1.58 mg/dL — AB (ref 0.44–1.00)
Chloride: 104 mmol/L (ref 101–111)
GFR calc Af Amer: 36 mL/min — ABNORMAL LOW (ref >60)
GFR, EST NON AFRICAN AMERICAN: 31 mL/min — AB (ref >60)
GLUCOSE: 124 mg/dL — AB (ref 65–99)
Potassium: 4.6 mmol/L (ref 3.5–5.1)
Sodium: 138 mmol/L (ref 135–145)

## 2016-01-15 LAB — GLUCOSE, CAPILLARY
GLUCOSE-CAPILLARY: 223 mg/dL — AB (ref 65–99)
GLUCOSE-CAPILLARY: 124 mg/dL — AB (ref 65–99)

## 2016-01-15 LAB — BLOOD GAS, ARTERIAL
Acid-Base Excess: 4.9 mmol/L — ABNORMAL HIGH (ref 0.0–2.0)
Bicarbonate: 31.2 mmol/L — ABNORMAL HIGH (ref 20.0–28.0)
FIO2: 0.21
O2 SAT: 74.5 %
PATIENT TEMPERATURE: 37
PCO2 ART: 54 mmHg — AB (ref 32.0–48.0)
pH, Arterial: 7.37 (ref 7.350–7.450)
pO2, Arterial: 41 mmHg — ABNORMAL LOW (ref 83.0–108.0)

## 2016-01-15 LAB — TROPONIN I: Troponin I: <0.03 ng/mL (ref <0.03)

## 2016-01-15 LAB — CBC
HCT: 32.3 % — ABNORMAL LOW (ref 35.0–47.0)
HEMOGLOBIN: 10.7 g/dL — AB (ref 12.0–16.0)
MCH: 31.6 pg (ref 26.0–34.0)
MCHC: 33.2 g/dL (ref 32.0–36.0)
MCV: 95.3 fL (ref 80.0–100.0)
Platelets: 255 10*3/uL (ref 150–440)
RBC: 3.39 MIL/uL — ABNORMAL LOW (ref 3.80–5.20)
RDW: 13.9 % (ref 11.5–14.5)
WBC: 11.4 10*3/uL — ABNORMAL HIGH (ref 3.6–11.0)

## 2016-01-15 MED ORDER — PANTOPRAZOLE SODIUM 40 MG PO TBEC
40.0000 mg | DELAYED_RELEASE_TABLET | Freq: Every day | ORAL | Status: DC
  Administered 2016-01-16: 40 mg via ORAL
  Filled 2016-01-15: qty 1

## 2016-01-15 MED ORDER — NORTRIPTYLINE HCL 25 MG PO CAPS
75.0000 mg | ORAL_CAPSULE | Freq: Every day | ORAL | Status: DC
  Administered 2016-01-15: 75 mg via ORAL
  Filled 2016-01-15: qty 3

## 2016-01-15 MED ORDER — ENOXAPARIN SODIUM 40 MG/0.4ML ~~LOC~~ SOLN
40.0000 mg | Freq: Two times a day (BID) | SUBCUTANEOUS | Status: DC
  Administered 2016-01-15 – 2016-01-16 (×2): 40 mg via SUBCUTANEOUS
  Filled 2016-01-15 (×2): qty 0.4

## 2016-01-15 MED ORDER — FERROUS SULFATE DRIED ER 45 MG PO TBCR: 1.0000 | EXTENDED_RELEASE_TABLET | ORAL | Status: DC

## 2016-01-15 MED ORDER — LEVOTHYROXINE SODIUM 50 MCG PO TABS: 50.0000 ug | ORAL_TABLET | Freq: Every day | ORAL | Status: DC

## 2016-01-15 MED ORDER — CITALOPRAM HYDROBROMIDE 20 MG PO TABS
20.0000 mg | ORAL_TABLET | ORAL | Status: DC
  Administered 2016-01-16: 20 mg via ORAL
  Filled 2016-01-15: qty 1

## 2016-01-15 MED ORDER — INSULIN ASPART 100 UNIT/ML ~~LOC~~ SOLN
0.0000 [IU] | Freq: Three times a day (TID) | SUBCUTANEOUS | Status: DC
  Administered 2016-01-16: 2 [IU] via SUBCUTANEOUS
  Filled 2016-01-15: qty 2

## 2016-01-15 MED ORDER — CALCIUM CARB-CHOLECALCIFEROL 600-800 MG-UNIT PO TABS: 1.0000 | ORAL_TABLET | Freq: Two times a day (BID) | ORAL | Status: DC

## 2016-01-15 MED ORDER — ACETAMINOPHEN 650 MG RE SUPP: 650.0000 mg | Freq: Four times a day (QID) | RECTAL | Status: DC | PRN

## 2016-01-15 MED ORDER — CALCIUM CARBONATE-VITAMIN D 500-200 MG-UNIT PO TABS
1.0000 | ORAL_TABLET | Freq: Two times a day (BID) | ORAL | Status: DC
  Administered 2016-01-15 – 2016-01-16 (×2): 1 via ORAL
  Filled 2016-01-15 (×2): qty 1

## 2016-01-15 MED ORDER — ADULT MULTIVITAMIN W/MINERALS CH
1.0000 | ORAL_TABLET | ORAL | Status: DC
Start: 2016-01-16 — End: 2016-01-16
  Administered 2016-01-16: 1 via ORAL
  Filled 2016-01-15: qty 1

## 2016-01-15 MED ORDER — ONDANSETRON HCL 4 MG/2ML IJ SOLN: 4.0000 mg | Freq: Four times a day (QID) | INTRAMUSCULAR | Status: DC | PRN

## 2016-01-15 MED ORDER — SODIUM CHLORIDE 0.9 % IV SOLN
INTRAVENOUS | Status: DC
  Administered 2016-01-15 – 2016-01-16 (×2): via INTRAVENOUS

## 2016-01-15 MED ORDER — LIRAGLUTIDE 18 MG/3ML ~~LOC~~ SOPN: 0.6000 mg | PEN_INJECTOR | SUBCUTANEOUS | Status: DC

## 2016-01-15 MED ORDER — TRAMADOL HCL 50 MG PO TABS: 50.0000 mg | ORAL_TABLET | ORAL | Status: DC | PRN

## 2016-01-15 MED ORDER — DOCUSATE SODIUM 100 MG PO CAPS
100.0000 mg | ORAL_CAPSULE | Freq: Two times a day (BID) | ORAL | Status: DC
  Administered 2016-01-15 – 2016-01-16 (×2): 100 mg via ORAL
  Filled 2016-01-15 (×2): qty 1

## 2016-01-15 MED ORDER — NALOXONE HCL 2 MG/2ML IJ SOSY
0.4000 mg | PREFILLED_SYRINGE | Freq: Once | INTRAMUSCULAR | Status: AC
  Administered 2016-01-15: 0.4 mg via INTRAVENOUS
  Filled 2016-01-15: qty 2

## 2016-01-15 MED ORDER — SENNA 8.6 MG PO TABS
1.0000 | ORAL_TABLET | Freq: Two times a day (BID) | ORAL | Status: DC
  Administered 2016-01-15 – 2016-01-16 (×2): 8.6 mg via ORAL
  Filled 2016-01-15 (×2): qty 1

## 2016-01-15 MED ORDER — INSULIN ASPART 100 UNIT/ML ~~LOC~~ SOLN: 0.0000 [IU] | Freq: Every day | SUBCUTANEOUS | Status: DC

## 2016-01-15 MED ORDER — ATORVASTATIN CALCIUM 20 MG PO TABS
40.0000 mg | ORAL_TABLET | ORAL | Status: DC
  Administered 2016-01-16: 40 mg via ORAL
  Filled 2016-01-15: qty 2

## 2016-01-15 MED ORDER — ONDANSETRON HCL 4 MG PO TABS: 4.0000 mg | ORAL_TABLET | Freq: Four times a day (QID) | ORAL | Status: DC | PRN

## 2016-01-15 MED ORDER — GABAPENTIN 300 MG PO CAPS: 300.0000 mg | ORAL_CAPSULE | Freq: Three times a day (TID) | ORAL | Status: DC

## 2016-01-15 MED ORDER — PIOGLITAZONE HCL-METFORMIN HCL 15-850 MG PO TABS: 1.0000 | ORAL_TABLET | Freq: Two times a day (BID) | ORAL | Status: DC

## 2016-01-15 MED ORDER — FERROUS SULFATE 325 (65 FE) MG PO TABS
325.0000 mg | ORAL_TABLET | Freq: Every day | ORAL | Status: DC
  Administered 2016-01-16: 325 mg via ORAL
  Filled 2016-01-15: qty 1

## 2016-01-15 MED ORDER — CELECOXIB 200 MG PO CAPS
200.0000 mg | ORAL_CAPSULE | Freq: Two times a day (BID) | ORAL | Status: DC
  Administered 2016-01-15 – 2016-01-16 (×2): 200 mg via ORAL
  Filled 2016-01-15 (×2): qty 1

## 2016-01-15 MED ORDER — SODIUM CHLORIDE 0.9 % IV BOLUS (SEPSIS)
500.0000 mL | Freq: Once | INTRAVENOUS | Status: AC
  Administered 2016-01-15: 500 mL via INTRAVENOUS

## 2016-01-15 MED ORDER — ACETAMINOPHEN 325 MG PO TABS: 650.0000 mg | ORAL_TABLET | Freq: Four times a day (QID) | ORAL | Status: DC | PRN

## 2016-01-15 NOTE — ED Triage Notes (Addendum)
Knee replacement Monday. Pt "slid" from bed this morning per husband because her sugar was low but he did not check it just gave her juice. He then gave 2 oxycodone. Pt drowsy on arrival with sats in 70s. Placed on 5 L [place] sats 96%. Generalized weakness per daughter. tx for UTI prior to surgery

## 2016-01-15 NOTE — ED Notes (Signed)
Patient transported to CT 

## 2016-01-15 NOTE — ED Notes (Signed)
Introduced self to pt and family. Pt rated pain at a 3 on a 0-10 scale.

## 2016-01-15 NOTE — ED Provider Notes (Signed)
Time Seen: Approximately 1538  I have reviewed the triage notes  Chief Complaint: Altered Mental Status   History of Present Illness: Leslie Weber is a 75 y.o. female who recently had a right knee replacement surgery. Patient was discharged yesterday from the hospital. She apparently got home while and received her pain medication which included Percocet along with Ultram. She received her last dosage of narcotic pain medications this morning at about 8:30. She had altered mental status and confusion at home and her husband thought it might be because her blood sugar dropped. He gave her juice but she didn't seem to improved and seemed to be having generalized weakness. She still lives in emergency department this afternoon confused and was found to be hypoxic at home with saturations in the 70s. She was started on a 5 L nasal cannula and reached 96%. Patient still has an altered mental status even after Narcan was administered here in emergency department. She is unable to really answer any questions   Past Medical History:  Diagnosis Date  . Arthritis   . Depression   . Diabetes mellitus without complication (HCC)   . Fever due to malaria    75 years old  . Fibromyalgia   . GERD (gastroesophageal reflux disease)   . History of hiatal hernia   . Hypertension   . Hypothyroidism   . Neuromuscular disorder (HCC)    Bells palsy left side of face  . Neuropathy of both feet   . Pneumonia 1997   history of walking pneumonia    Patient Active Problem List   Diagnosis Date Noted  . S/P total knee arthroplasty 01/12/2016  . Pulmonary arterial hypertension 11/04/2015  . Primary osteoarthritis of right knee 10/23/2015  . 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 Obesity 05/03/2014  . Peripheral neuropathy (HCC) 05/03/2014  . Spinal stenosis 05/03/2014  . Hypothyroidism 05/03/2014  . Hypertension 05/03/2014  . Hyperlipidemia, unspecified 05/03/2014  . Coronary artery  disease 05/03/2014  . DDD (degenerative disc disease), lumbar 01/08/2014    Past Surgical History:  Procedure Laterality Date  . ABDOMINAL HYSTERECTOMY    . APPENDECTOMY    . BACK SURGERY    . BLADDER SUSPENSION  2002  . CHOLECYSTECTOMY    . KNEE ARTHROPLASTY Right 01/12/2016   Procedure: COMPUTER ASSISTED TOTAL KNEE ARTHROPLASTY;  Surgeon: Donato Heinz, MD;  Location: [facility] ORS;  Service: Orthopedics;  Laterality: Right;  . TONSILLECTOMY      Past Surgical History:  Procedure Laterality Date  . ABDOMINAL HYSTERECTOMY    . APPENDECTOMY    . BACK SURGERY    . BLADDER SUSPENSION  2002  . CHOLECYSTECTOMY    . KNEE ARTHROPLASTY Right 01/12/2016   Procedure: COMPUTER ASSISTED TOTAL KNEE ARTHROPLASTY;  Surgeon: Donato Heinz, MD;  Location: [facility] OR[facility];  Service: Orthopedics;  Laterality: Right;  . TONSILLECTOMY      Current Outpatient Rx  . Order #: [id number] Class: Historical Med  . Order #: [id number] Class: Historical Med  . Order #: [id number] Class: Historical Med  . Order #: [id number] Class: Historical Med  . Order #: [id number] Class: Historical Med  . Order #: [id number] Class: No Print  . Order #: [id number] Class: Historical Med  . Order #: [id number] Class: Historical Med  . Order #: [id number] Class: Historical Med  . Order #: [id number] Class: Historical Med  . Order #: [id number] Class: Historical Med  . Order #: [id number] Class: Historical Med  . Order #: [id number] Class: Historical Med  . Order #: [id number] Class: Print  .  Order #: [id number] Class: Historical Med  . Order #: [id number] Class: Historical Med  . Order #: [id number] Class: Print    Allergies:  Review of patient's allergies indicates no known allergies.  Family History: History reviewed. No pertinent family history.  Social History: Social History  Substance Use Topics  . Smoking status: Never Smoker  . Smokeless tobacco: Never Used  . Alcohol use No     Review of Systems:   10 point review of systems was  performed and was otherwise negative: Review of systems mainly acquired from the family Constitutional: No fever Eyes: No visual disturbances ENT: No sore throat, ear pain Cardiac: No chest pain Respiratory: No shortness of breath, wheezing, or stridor Abdomen: No abdominal pain, no vomiting, No diarrhea Endocrine: No weight loss, No night sweats Extremities: No peripheral edema, cyanosis Skin: No rashes, easy bruising Neurologic: No focal weakness, trouble with speech or swollowing Urologic: No dysuria, Hematuria, or urinary frequency   Physical Exam:  ED Triage Vitals  Enc Vitals Group     BP 01/15/16 1438 (!) 95/45     Pulse Rate 01/15/16 1433 92     Resp 01/15/16 1433 16     Temp 01/15/16 1433 98.6 F (37 C)     Temp Source 01/15/16 1433 Oral     SpO2 01/15/16 1432 (!) 75 %     Weight 01/15/16 1438 250 lb (113.4 kg)     Height 01/15/16 1438 5\' 2"  (1.575 m)     Head Circumference --      Peak Flow --      Pain Score --      Pain Loc --      Pain Edu? --      Excl. in GC? --     General: Awake , Alert , and Oriented times 3; GCS 13 Head: Normal cephalic , atraumatic Eyes: Pupils equal , round, reactive to light Nose/Throat: No nasal drainage, patent upper airway without erythema or exudate.  Neck: Supple, Full range of motion, No anterior adenopathy or palpable thyroid masses Lungs: Clear to ascultation without wheezes , rhonchi, or rales Heart: Regular rate, regular rhythm without murmurs , gallops , or rubs Abdomen: Soft, non tender without rebound, guarding , or rigidity; bowel sounds positive and symmetric in all 4 quadrants. No organomegaly .        Extremities: Examination postoperatively shows well healing well aligned surgical wounds with expected postoperative edema no signs of infection Neurologic: normal ambulation, Motor symmetric without deficits, sensory intact Skin: warm, dry, no rashes   Labs:   All laboratory work was reviewed including any  pertinent negatives or positives listed below:  Labs Reviewed  BASIC METABOLIC PANEL - Abnormal; Notable for the following:       Result Value   Glucose, Bld 124 (*)    BUN 32 (*)    Creatinine, Ser 1.58 (*)    GFR calc non Af Amer 31 (*)    GFR calc Af Amer 36 (*)    All other components within normal limits  CBC - Abnormal; Notable for the following:    WBC 11.4 (*)    RBC 3.39 (*)    Hemoglobin 10.7 (*)    HCT 32.3 (*)    All other components within normal limits  URINALYSIS COMPLETEWITH MICROSCOPIC ([facility] ONLY) - Abnormal; Notable for the following:    Color, Urine YELLOW (*)    APPearance CLEAR (*)    Protein, ur 30 (*)    Leukocytes, UA  1+ (*)    Squamous Epithelial / LPF 0-5 (*)    All other components within normal limits  BLOOD GAS, ARTERIAL - Abnormal; Notable for the following:    pCO2 arterial 54 (*)    pO2, Arterial 41 (*)    Bicarbonate 31.2 (*)    Acid-Base Excess 4.9 (*)    All other components within normal limits  URINE CULTURE  TROPONIN I  Labwork shows some renal insufficiency. He has some findings consistent with urinary tract infection apparently had a UTI after surgery.  EKG: * ED ECG REPORT I, Jennye Moccasin, the attending physician, personally viewed and interpreted this ECG.  Date: 01/15/2016 EKG Time:1452 Rate: 93 Rhythm: normal sinus rhythm QRS Axis: normal Intervals: normal ST/T Wave abnormalities: normal Conduction Disturbances: none Narrative Interpretation: unremarkable No acute ischemic changes   Radiology:   "Ct Head Wo Contrast  Result Date: 01/15/2016 CLINICAL DATA:  Sliding fall from bed this morning. Knee replacement Monday of this week. Husband assumed hypoglycemia and administered juice and 2 oxycodone. Patient presents at emergency room drowsy and with altered mental status. EXAM: CT HEAD WITHOUT CONTRAST TECHNIQUE: Contiguous axial images were obtained from the base of the skull through the vertex without intravenous  contrast. COMPARISON:  None. FINDINGS: Brain: The brainstem, cerebellum, cerebral peduncles, thalami, basal ganglia, basilar cisterns, and ventricular system appear within normal limits. Periventricular white matter and corona radiata hypodensities favor chronic ischemic microvascular white matter disease. No intracranial hemorrhage, mass lesion, or acute CVA. Vascular: There is atherosclerotic calcification of the cavernous carotid arteries bilaterally. Skull: Unremarkable Sinuses/Orbits: Unremarkable Other: No supplemental non-categorized findings. IMPRESSION: 1. No acute intracranial findings. 2. Periventricular white matter and corona radiata hypodensities favor chronic ischemic microvascular white matter disease. Electronically Signed   By: Gaylyn Rong M.D.   On: 01/15/2016 17:22   US Venous Img Lower Bilateral  Result Date: 01/15/2016 CLINICAL DATA:  Bilateral lower extremity swelling for 4 days. EXAM: BILATERAL LOWER EXTREMITY VENOUS DOPPLER ULTRASOUND TECHNIQUE: Gray-scale sonography with graded compression, as well as color Doppler and duplex ultrasound were performed to evaluate the lower extremity deep venous systems from the level of the common femoral vein and including the common femoral, femoral, profunda femoral, popliteal and calf veins including the posterior tibial, peroneal and gastrocnemius veins when visible. The superficial great saphenous vein was also interrogated. Spectral Doppler was utilized to evaluate flow at rest and with distal augmentation maneuvers in the common femoral, femoral and popliteal veins. COMPARISON:  None. FINDINGS: RIGHT LOWER EXTREMITY Common Femoral Vein: No evidence of thrombus. Normal compressibility, respiratory phasicity and response to augmentation. Saphenofemoral Junction: No evidence of thrombus. Normal compressibility and flow on color Doppler imaging. Profunda Femoral Vein: No evidence of thrombus. Normal compressibility and flow on color Doppler  imaging. Femoral Vein: No evidence of thrombus. Normal compressibility, respiratory phasicity and response to augmentation. Popliteal Vein: Unable to adequately evaluate a is the patient was unable to tolerate probe pressure for evaluation. Calf Veins: No evidence of thrombus. Normal compressibility and flow on color Doppler imaging. Superficial Great Saphenous Vein: No evidence of thrombus. Normal compressibility and flow on color Doppler imaging. Venous Reflux:  None. Other Findings:  None. LEFT LOWER EXTREMITY Common Femoral Vein: No evidence of thrombus. Normal compressibility, respiratory phasicity and response to augmentation. Saphenofemoral Junction: No evidence of thrombus. Normal compressibility and flow on color Doppler imaging. Profunda Femoral Vein: No evidence of thrombus. Normal compressibility and flow on color Doppler imaging. Femoral Vein: No evidence of thrombus.  Normal compressibility, respiratory phasicity and response to augmentation. Popliteal Vein: No evidence of thrombus. Normal compressibility, respiratory phasicity and response to augmentation. Calf Veins: No evidence of thrombus. Normal compressibility and flow on color Doppler imaging. Superficial Great Saphenous Vein: No evidence of thrombus. Normal compressibility and flow on color Doppler imaging. Venous Reflux:  None. Other Findings:  None. IMPRESSION: No evidence of deep venous thrombosis. Electronically Signed   By: Sherian Rein M.D.   On: 01/15/2016 18:15   Dg Chest Port 1 View  Result Date: 01/15/2016 CLINICAL DATA:  Shortness of breath EXAM: PORTABLE CHEST 1 VIEW COMPARISON:  10/17/2014 chest radiograph. FINDINGS: Low lung volumes. Stable cardiomediastinal silhouette with top-normal heart size and moderate hiatal hernia. No pneumothorax. No pleural effusion. Stable elevation of the right hemidiaphragm. Mild bibasilar atelectasis. No pulmonary edema. IMPRESSION: Low lung volumes.  Bibasilar atelectasis. Stable moderate  hiatal hernia. Electronically Signed   By: Delbert Phenix M.D.   On: 01/15/2016 16:57   Dg Knee Right Port  Result Date: 01/12/2016 CLINICAL DATA:  Total knee replacement EXAM: PORTABLE RIGHT KNEE - 1-2 VIEW COMPARISON:  01/12/2016 FINDINGS: Right knee replacement in satisfactory position and alignment. No fracture or complication. Gas in the joint with drains in the joint. IMPRESSION: Satisfactory right knee replacement. Electronically Signed   By: Marlan Palau M.D.   On: 01/12/2016 14:55   Mm Digital Screening Bilateral  Result Date: 01/01/2016 CLINICAL DATA:  Screening. EXAM: DIGITAL SCREENING BILATERAL MAMMOGRAM WITH CAD COMPARISON:  Previous exam(s). ACR Breast Density Category b: There are scattered areas of fibroglandular density. FINDINGS: There are no findings suspicious for malignancy. Images were processed with CAD. IMPRESSION: No mammographic evidence of malignancy. A result letter of this screening mammogram will be mailed directly to the patient. RECOMMENDATION: Screening mammogram in one year. (Code:SM-B-01Y) BI-RADS CATEGORY  1: Negative. Electronically Signed   By: Bary Richard M.D.   On: 01/01/2016 11:49  "  I personally reviewed the radiologic studies  Postoperative results were reviewed ED Course:  Patient's stay here was uneventful. She was given Narcan really had no improvement in her mental status. Concern is for pulmonary embolism though she does not appear to have any DVTs which of course would be the most logical source. Her x-ray does not show any evidence of pneumonia. I'm not sure if her hypoxia was decreased just simply due to the decreased mental status. Her head CT shows no focal abnormalities. She's currently hypoxic on room air blood gas. Been receiving Lovenox and was placed in for her normal dosage for prophylaxis for today she did not receive Lovenox earlier today. She still has findings of urinary tract infection and urine culture was added and she was started  on IV antibiotics. Clinical Course     Assessment: Altered mental status Hypoxia Postop knee replacement   Final Clinical Impression:   Final diagnoses:  Altered mental status, unspecified altered mental status type     Plan: Admission            Jennye Moccasin, MD 01/15/16 1610

## 2016-01-15 NOTE — ED Notes (Signed)
Pt assisted to the bathroom with 4 assist. Beth, tech and myself plus both daughter safely assisted pt to the toilet in room. Pt was able to produce urine with no difficulties reported.

## 2016-01-15 NOTE — ED Notes (Signed)
Pt just now returned from ct/US

## 2016-01-15 NOTE — ED Notes (Signed)
Ice pack applied to right knee

## 2016-01-15 NOTE — H&P (Signed)
[facility] NAME: Leslie Weber    MR#:  [id number]  DATE OF BIRTH:  [birth year]  DATE OF ADMISSION:  01/15/2016  PRIMARY CARE PHYSICIAN: Madelyn Brunner, MD   REQUESTING/REFERRING PHYSICIAN: Dr. Meade Maw  CHIEF COMPLAINT:   Chief Complaint  Patient presents with  . Altered Mental Status    HISTORY OF PRESENT ILLNESS:  Leslie Weber  is a 75 y.o. female with a known history of Depression diabetes mellitus, GERD, fibromyalgia, degenerative arthritis with the recent right total knee replacement surgery done on 01/12/2016 and got discharged home yesterday presents to hospital secondary to lethargy and confusion. Patient appears very dehydrated with dry lips. She is more alert and coherent at this time. Most of the history is obtained by her family at bedside. According to them she was discharged home yesterday evening. She took an oxycodone prior to discharge and had 1 more pill of oxycodone last night. Early this morning she had tramadol and oxycodone. She was noted to be very lethargic, difficult to be aroused and so brought to the emergency room. While in the hospital and at the time of discharge, she did not have any complaints. No complications after the surgery. Her vitals are stable at this time. No infection noted as well.   PAST MEDICAL HISTORY:   Past Medical History:  Diagnosis Date  . Arthritis   . Depression   . Diabetes mellitus without complication ([place])   . Fever due to malaria    75 years old  . Fibromyalgia   . GERD (gastroesophageal reflux disease)   . History of hiatal hernia   . Hypertension   . Hypothyroidism   . Neuromuscular disorder ([place])    Bells palsy left side of face  . Neuropathy of both feet   . Pneumonia 1997   history of walking pneumonia    PAST SURGICAL HISTORY:   Past Surgical History:  Procedure Laterality Date  . ABDOMINAL HYSTERECTOMY    . APPENDECTOMY    . BACK SURGERY      . BLADDER SUSPENSION  2002  . CHOLECYSTECTOMY    . KNEE ARTHROPLASTY Right 01/12/2016   Procedure: COMPUTER ASSISTED TOTAL KNEE ARTHROPLASTY;  Surgeon: Dereck Leep, MD;  Location: [facility] ORS;  Service: Orthopedics;  Laterality: Right;  . TONSILLECTOMY      SOCIAL HISTORY:   Social History  Substance Use Topics  . Smoking status: Never Smoker  . Smokeless tobacco: Never Used  . Alcohol use No    FAMILY HISTORY:   Family History  Problem Relation Age of Onset  . Liver cancer Mother   . Lung cancer Father     DRUG ALLERGIES:  No Known Allergies  REVIEW OF SYSTEMS:   Review of Systems  Constitutional: Positive for malaise/fatigue. Negative for chills, fever and weight loss.  HENT: Negative for ear discharge, ear pain, hearing loss and nosebleeds.   Eyes: Negative for blurred vision, double vision and photophobia.  Respiratory: Negative for cough, hemoptysis, shortness of breath and wheezing.   Cardiovascular: Negative for chest pain, palpitations, orthopnea and leg swelling.  Gastrointestinal: Negative for abdominal pain, constipation, diarrhea, heartburn, melena, nausea and vomiting.  Genitourinary: Negative for dysuria, frequency, hematuria and urgency.  Musculoskeletal: Positive for joint pain and myalgias. Negative for back pain and neck pain.       Right knee pain and swelling  Skin: Negative for rash.  Neurological: Negative for dizziness, tingling, sensory change,  speech change, focal weakness and headaches.  Endo/Heme/Allergies: Does not bruise/bleed easily.  Psychiatric/Behavioral: Negative for depression.    MEDICATIONS AT HOME:   Prior to Admission medications   Medication Sig Start Date End Date Taking? Authorizing Provider  amLODipine (NORVASC) 10 MG tablet Take 10 mg by mouth every morning.   Yes Historical Provider, MD  atorvastatin (LIPITOR) 40 MG tablet Take 40 mg by mouth every morning.   Yes Historical Provider, MD  Calcium Carb-Cholecalciferol  (CALCIUM 600+D) 600-800 MG-UNIT TABS Take 1 Dose by mouth 2 times daily at 12 noon and 4 pm.   Yes Historical Provider, MD  celecoxib (CELEBREX) 200 MG capsule Take 200 mg by mouth 2 (two) times daily.   Yes Historical Provider, MD  citalopram (CELEXA) 20 MG tablet Take 20 mg by mouth every morning.   Yes Historical Provider, MD  enoxaparin (LOVENOX) 40 MG/0.4ML injection Inject 0.4 mLs (40 mg total) into the skin daily. 01/14/16  Yes Watt Climes, PA  Ferrous Sulfate Dried 45 MG TBCR Take 1 tablet by mouth every morning.   Yes Historical Provider, MD  gabapentin (NEURONTIN) 300 MG capsule Take 300 mg by mouth 3 (three) times daily. 2 Capsules 3 times a day.   Yes Historical Provider, MD  levothyroxine (SYNTHROID, LEVOTHROID) 50 MCG tablet Take 50 mcg by mouth daily before breakfast.   Yes Historical Provider, MD  liraglutide (VICTOZA) 18 MG/3ML SOPN Inject 0.6 mg into the skin every morning.   Yes Historical Provider, MD  lisinopril-hydrochlorothiazide (PRINZIDE,ZESTORETIC) 20-12.5 MG tablet Take 1 tablet by mouth every morning.   Yes Historical Provider, MD  Multiple Vitamins-Minerals (CENTRUM WOMEN PO) Take 1 tablet by mouth every morning.   Yes Historical Provider, MD  nortriptyline (PAMELOR) 75 MG capsule Take 75 mg by mouth at bedtime.   Yes Historical Provider, MD  oxyCODONE (OXY IR/ROXICODONE) 5 MG immediate release tablet Take 1-2 tablets (5-10 mg total) by mouth every 4 (four) hours as needed for severe pain or breakthrough pain. 01/14/16  Yes Watt Climes, PA  pantoprazole (PROTONIX) 40 MG tablet Take 40 mg by mouth daily before breakfast.    Yes Historical Provider, MD  pioglitazone-metformin (ACTOPLUS MET) 15-850 MG tablet Take 1 tablet by mouth 2 (two) times daily with a meal.   Yes Historical Provider, MD  traMADol (ULTRAM) 50 MG tablet Take 1-2 tablets (50-100 mg total) by mouth every 4 (four) hours as needed for moderate pain. 01/14/16  Yes Watt Climes, PA      VITAL SIGNS:  Blood  pressure 115/60, pulse 95, temperature 98 F (36.7 C), temperature source Rectal, resp. rate 12, height _0  (1.575 m), weight 113.4 kg (250 lb), SpO2 95 %.  PHYSICAL EXAMINATION:   Physical Exam  GENERAL:  75 y.o.-year-old patient lying in the bed with no acute distress.  EYES: Pupils equal, round, reactive to light and accommodation. No scleral icterus. Extraocular muscles intact.  HEENT: Head atraumatic, normocephalic. Oropharynx and nasopharynx clear.  NECK:  Supple, no jugular venous distention. No thyroid enlargement, no tenderness.  LUNGS: Normal breath sounds bilaterally, no wheezing, rales,rhonchi or crepitation. No use of accessory muscles of respiration.  CARDIOVASCULAR: S1, S2 normal. No murmurs, rubs, or gallops.  ABDOMEN: Soft, nontender, nondistended. Bowel sounds present. No organomegaly or mass.  EXTREMITIES: No pedal edema, cyanosis, or clubbing.  NEUROLOGIC: Cranial nerves II through XII are intact. Muscle strength 5/5 in all extremities. Sensation intact. Gait not checked.  PSYCHIATRIC: The patient is alert and oriented  x 3.  SKIN: No obvious rash, lesion, or ulcer.   LABORATORY PANEL:   CBC  Recent Labs Lab 01/15/16 1451  WBC 11.4*  HGB 10.7*  HCT 32.3*  PLT 255   ------------------------------------------------------------------------------------------------------------------  Chemistries   Recent Labs Lab 01/15/16 1451  NA 138  K 4.6  CL 104  CO2 29  GLUCOSE 124*  BUN 32*  CREATININE 1.58*  CALCIUM 9.4   ------------------------------------------------------------------------------------------------------------------  Cardiac Enzymes No results for input(s): TROPONINI in the last 168 hours. ------------------------------------------------------------------------------------------------------------------  RADIOLOGY:  Ct Head Wo Contrast  Result Date: 01/15/2016 CLINICAL DATA:  Sliding fall from bed this morning. Knee replacement Monday  of this week. Husband assumed hypoglycemia and administered juice and 2 oxycodone. Patient presents at emergency room drowsy and with altered mental status. EXAM: CT HEAD WITHOUT CONTRAST TECHNIQUE: Contiguous axial images were obtained from the base of the skull through the vertex without intravenous contrast. COMPARISON:  None. FINDINGS: Brain: The brainstem, cerebellum, cerebral peduncles, thalami, basal ganglia, basilar cisterns, and ventricular system appear within normal limits. Periventricular white matter and corona radiata hypodensities favor chronic ischemic microvascular white matter disease. No intracranial hemorrhage, mass lesion, or acute CVA. Vascular: There is atherosclerotic calcification of the cavernous carotid arteries bilaterally. Skull: Unremarkable Sinuses/Orbits: Unremarkable Other: No supplemental non-categorized findings. IMPRESSION: 1. No acute intracranial findings. 2. Periventricular white matter and corona radiata hypodensities favor chronic ischemic microvascular white matter disease. Electronically Signed   By: Van Clines M.D.   On: 01/15/2016 17:22   US Venous Img Lower Bilateral  Result Date: 01/15/2016 CLINICAL DATA:  Bilateral lower extremity swelling for 4 days. EXAM: BILATERAL LOWER EXTREMITY VENOUS DOPPLER ULTRASOUND TECHNIQUE: Gray-scale sonography with graded compression, as well as color Doppler and duplex ultrasound were performed to evaluate the lower extremity deep venous systems from the level of the common femoral vein and including the common femoral, femoral, profunda femoral, popliteal and calf veins including the posterior tibial, peroneal and gastrocnemius veins when visible. The superficial great saphenous vein was also interrogated. Spectral Doppler was utilized to evaluate flow at rest and with distal augmentation maneuvers in the common femoral, femoral and popliteal veins. COMPARISON:  None. FINDINGS: RIGHT LOWER EXTREMITY Common Femoral Vein: No  evidence of thrombus. Normal compressibility, respiratory phasicity and response to augmentation. Saphenofemoral Junction: No evidence of thrombus. Normal compressibility and flow on color Doppler imaging. Profunda Femoral Vein: No evidence of thrombus. Normal compressibility and flow on color Doppler imaging. Femoral Vein: No evidence of thrombus. Normal compressibility, respiratory phasicity and response to augmentation. Popliteal Vein: Unable to adequately evaluate a is the patient was unable to tolerate probe pressure for evaluation. Calf Veins: No evidence of thrombus. Normal compressibility and flow on color Doppler imaging. Superficial Great Saphenous Vein: No evidence of thrombus. Normal compressibility and flow on color Doppler imaging. Venous Reflux:  None. Other Findings:  None. LEFT LOWER EXTREMITY Common Femoral Vein: No evidence of thrombus. Normal compressibility, respiratory phasicity and response to augmentation. Saphenofemoral Junction: No evidence of thrombus. Normal compressibility and flow on color Doppler imaging. Profunda Femoral Vein: No evidence of thrombus. Normal compressibility and flow on color Doppler imaging. Femoral Vein: No evidence of thrombus. Normal compressibility, respiratory phasicity and response to augmentation. Popliteal Vein: No evidence of thrombus. Normal compressibility, respiratory phasicity and response to augmentation. Calf Veins: No evidence of thrombus. Normal compressibility and flow on color Doppler imaging. Superficial Great Saphenous Vein: No evidence of thrombus. Normal compressibility and flow on color Doppler imaging. Venous Reflux:  None.  Other Findings:  None. IMPRESSION: No evidence of deep venous thrombosis. Electronically Signed   By: Abelardo Diesel M.D.   On: 01/15/2016 18:15   Dg Chest Port 1 View  Result Date: 01/15/2016 CLINICAL DATA:  Shortness of breath EXAM: PORTABLE CHEST 1 VIEW COMPARISON:  10/17/2014 chest radiograph. FINDINGS: Low lung  volumes. Stable cardiomediastinal silhouette with top-normal heart size and moderate hiatal hernia. No pneumothorax. No pleural effusion. Stable elevation of the right hemidiaphragm. Mild bibasilar atelectasis. No pulmonary edema. IMPRESSION: Low lung volumes.  Bibasilar atelectasis. Stable moderate hiatal hernia. Electronically Signed   By: Ilona Sorrel M.D.   On: 01/15/2016 16:57    EKG:   Orders placed or performed during the hospital encounter of 01/15/16  . ED EKG  . ED EKG  . EKG 12-Lead  . EKG 12-Lead    IMPRESSION AND PLAN:   Leslie Weber  is a 75 y.o. female with a known history of Depression diabetes mellitus, GERD, fibromyalgia, degenerative arthritis with the recent right total knee replacement surgery done on 01/12/2016 and got discharged home yesterday presents to hospital secondary to lethargy and confusion.  #1 altered mental status- likely toxic metabolic encephalopathy. Could be from pain medications and also dehydration. -IV fluids. Hold blood pressure medications. -Hold oxycodone. Continue tramadol for pain. Also hold gabapentin. -CT of the head without any acute findings.  #2 degenerative arthritis with recent total right knee replacement surgery- postoperative day 3 today. -Ortho consulted. Keep the leg elevated and continue ice. Limited use of pain medications until mental status is more alert. -Continue Lovenox for DVT prophylaxis. Doppler of the right leg negative for any DVT. Patient has been on Lovenox to her surgery  #3 diabetes mellitus-continue with TED hose and Actos and metformin. Add sliding scale insulin.  #4 hypertension-hold blood pressure medication Norvasc, lisinopril, hydrochlorothiazide at this time as blood pressure is low normal. Gentle hydration with IV fluids  #5 GERD-on Protonix  #6 DVT prophylaxis-on Lovenox  Physical therapy consulted    All the records are reviewed and case discussed with ED provider. Management plans discussed with  the patient, family and they are in agreement.  CODE STATUS: Full Code  TOTAL TIME TAKING CARE OF THIS PATIENT: 50 minutes.    Gladstone Lighter M.D on 01/15/2016 at 8:15 PM  Between 7am to 6pm - Pager - [phone]  After 6pm go to [email or web address] - password [email or web address] [facility]  Office  [phone]  CC: Primary care physician; Madelyn Brunner, MD

## 2016-01-16 LAB — CBC
HCT: 32 % — ABNORMAL LOW (ref 35.0–47.0)
Hemoglobin: 10.6 g/dL — ABNORMAL LOW (ref 12.0–16.0)
MCH: 31.9 pg (ref 26.0–34.0)
MCHC: 33.2 g/dL (ref 32.0–36.0)
MCV: 96 fL (ref 80.0–100.0)
PLATELETS: 231 10*3/uL (ref 150–440)
RBC: 3.33 MIL/uL — AB (ref 3.80–5.20)
RDW: 14 % (ref 11.5–14.5)
WBC: 9.9 10*3/uL (ref 3.6–11.0)

## 2016-01-16 LAB — BASIC METABOLIC PANEL
Anion gap: 5 (ref 5–15)
BUN: 30 mg/dL — ABNORMAL HIGH (ref 6–20)
CHLORIDE: 106 mmol/L (ref 101–111)
CO2: 29 mmol/L (ref 22–32)
CREATININE: 1.01 mg/dL — AB (ref 0.44–1.00)
Calcium: 9.4 mg/dL (ref 8.9–10.3)
GFR calc non Af Amer: 54 mL/min — ABNORMAL LOW (ref >60)
GLUCOSE: 117 mg/dL — AB (ref 65–99)
Potassium: 4 mmol/L (ref 3.5–5.1)
Sodium: 140 mmol/L (ref 135–145)

## 2016-01-16 LAB — GLUCOSE, CAPILLARY
GLUCOSE-CAPILLARY: 153 mg/dL — AB (ref 65–99)
Glucose-Capillary: 95 mg/dL (ref 65–99)

## 2016-01-16 LAB — URINE CULTURE: Culture: NO GROWTH

## 2016-01-16 MED ORDER — DEXTROSE 5 % IV SOLN: 1.0000 g | INTRAVENOUS | Status: DC

## 2016-01-16 MED ORDER — CEFTRIAXONE SODIUM-DEXTROSE 1-3.74 GM-% IV SOLR
1.0000 g | INTRAVENOUS | Status: DC
  Administered 2016-01-16: 1 g via INTRAVENOUS
  Filled 2016-01-16: qty 50

## 2016-01-16 MED ORDER — CIPROFLOXACIN HCL 500 MG PO TABS: 500.0000 mg | ORAL_TABLET | Freq: Two times a day (BID) | ORAL | 0 refills | Status: DC

## 2016-01-16 MED ORDER — TRAMADOL HCL 50 MG PO TABS: 50.0000 mg | ORAL_TABLET | Freq: Four times a day (QID) | ORAL | 1 refills | Status: DC | PRN

## 2016-01-16 NOTE — Clinical Social Work Note (Signed)
Patient to discharge today to [facility]. Tammy at [facility] is aware and discharge information sent. Nurse to call report. Patient and daughter and husband request EMS transport.  York SpanielMonica Sharaya Boruff MSW,LCSW [phone]

## 2016-01-16 NOTE — Clinical Social Work Note (Signed)
Clinical Social Work Assessment  Patient Details  Name: Leslie Weber MRN: [id number] Date of Birth: [birth year]  Date of referral:  01/16/16               Reason for consult:  Facility Placement                Permission sought to share information with:  Chartered certified accountant granted to share information::  Yes, Verbal Permission Granted  Name::      [facility]    Agency::     Relationship::     Contact Information:     Housing/Transportation Living arrangements for the past 2 months:  Single Family Home Source of Information:  Patient, Adult Children, Spouse Patient Interpreter Needed:  None Criminal Activity/Legal Involvement Pertinent to Current Situation/Hospitalization:  No - Comment as needed Significant Relationships:  Adult Children, Spouse Lives with:  Spouse Do you feel safe going back to the place where you live?  Yes Need for family participation in patient care:  Yes (Comment)  Care giving concerns:  Patient is a readmit that discharged home on 01/14/16.    Social Worker assessment / plan:  Holiday representative ([facility]) reviewed chart and noted that patient discharged home on 01/14/16 after a planned knee surgery. PT is recommending SNF today. CSW met with patient and her daughter Dorian Pod and husband Floy at bedside. CSW introduced self and explained role of CSW department. Per daughter patient was giving her A game when she was here for surgery so she could go home. Daughter reported that patient is very weak and they would like for patient to go to SNF. Per MD patient will be ready for D/C today.   FL2 complete and faxed out. [facility] chose [facility]. [facility] discharged [facility] today. Please reconsult if future social work needs arise. CSW signing off.   Employment status:  Retired Nurse, adult PT Recommendations:  [place] [place] / Referral to community resources:  [place]  Patient/Family's Response to care:  Patient and family are agreeable for patient to go to [facility].   Patient/Family's Understanding of and Emotional Response to Diagnosis, Current Treatment, and Prognosis:  Patient was family were pleasant and thanked CSW for assistance.   Emotional Assessment Appearance:  Appears stated age Attitude/Demeanor/Rapport:    Affect (typically observed):  Accepting, Adaptable, Pleasant Orientation:  Oriented to Self, Oriented to Place, Oriented to  Time, Oriented to Situation Alcohol / Substance use:  Not Applicable Psych involvement (Current and /or in the community):  No (Comment)  Discharge Needs  Concerns to be addressed:  Discharge Planning Concerns Readmission within the last 30 days:  Yes Current discharge risk:  Dependent with Mobility Barriers to Discharge:  No Barriers Identified   Houa Ackert, Veronia Beets, LCSW 01/16/2016, 3:25 PM

## 2016-01-16 NOTE — [place] FL2 (Signed)
[place] MEDICAID FL2 LEVEL OF CARE SCREENING TOOL     IDENTIFICATION  Patient Name: Leslie Weber Birthdate: 04-11-1940 Sex: female Admission Date (Current Location): 01/15/2016  [place] and [place] Number:  Chiropodist and Address:  [facility], [address], [place], [place] [address]      Provider Number: [id number]  Attending Physician Name and Address:  Shaune Pollack, MD  Relative Name and Phone Number:       Current Level of Care: Hospital Recommended Level of Care: Skilled Nursing Facility Prior Approval Number:    Date Approved/Denied:   PASRR Number:  ( [id number] )  Discharge Plan: SNF    Current Diagnoses: Patient Active Problem List   Diagnosis Date Noted  . Altered mental status 01/15/2016  . S/P total knee arthroplasty 01/12/2016  . Pulmonary arterial hypertension 11/04/2015  . Primary osteoarthritis of right knee 10/23/2015  . 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 Obesity 05/03/2014  . Peripheral neuropathy (HCC) 05/03/2014  . Spinal stenosis 05/03/2014  . Hypothyroidism 05/03/2014  . Hypertension 05/03/2014  . Hyperlipidemia, unspecified 05/03/2014  . Coronary artery disease 05/03/2014  . DDD (degenerative disc disease), lumbar 01/08/2014    Orientation RESPIRATION BLADDER Height & Weight     Self, Time, Situation, Place  O2 (2 Liters Oxygen ) Continent Weight: 250 lb (113.4 kg) Height:  5\' 2"  (157.5 cm)  BEHAVIORAL SYMPTOMS/MOOD NEUROLOGICAL BOWEL NUTRITION STATUS   (none)  ( Bells palsy left side of face) Continent Diet (Diet: Heart Healthy/ Carb Modified )  AMBULATORY STATUS COMMUNICATION OF NEEDS Skin   Extensive Assist Verbally Surgical wounds (Incision: Right Knee )                       Personal Care Assistance Level of Assistance  Bathing, Feeding, Dressing Bathing Assistance: Limited assistance Feeding assistance: Independent Dressing Assistance: Limited assistance      Functional Limitations Info  Sight, Hearing, Speech Sight Info: Adequate Hearing Info: Adequate Speech Info: Adequate    SPECIAL CARE FACTORS FREQUENCY  PT (By licensed PT), OT (By licensed OT)     PT Frequency:  (5) OT Frequency:  (5)            Contractures      Additional Factors Info  Code Status, Allergies, Insulin Sliding Scale Code Status Info:  (Full Code. ) Allergies Info:  (No Known Allergies. )   Insulin Sliding Scale Info:  (NovoLog Insulin Injections )       Current Medications (01/16/2016):  This is the current hospital active medication list Current Facility-Administered Medications  Medication Dose Route Frequency Provider Last Rate Last Dose  . 0.9 %  sodium chloride infusion   Intravenous Continuous Shaune Pollack, MD 75 mL/hr at 01/16/16 0944    . acetaminophen (TYLENOL) tablet 650 mg  650 mg Oral Q6H PRN Enid Baas, MD       Or  . acetaminophen (TYLENOL) suppository 650 mg  650 mg Rectal Q6H PRN Enid Baas, MD      . atorvastatin (LIPITOR) tablet 40 mg  40 mg Oral Garen Lah, MD   40 mg at 01/16/16 1001  . calcium-vitamin D (OSCAL WITH D) 500-200 MG-UNIT per tablet 1 tablet  1 tablet Oral BID Enid Baas, MD   1 tablet at 01/16/16 0945  . cefTRIAXone (ROCEPHIN) IVPB 1 g  1 g Intravenous Q24H Shaune Pollack, MD   1 g at 01/16/16 0946  .  celecoxib (CELEBREX) capsule 200 mg  200 mg Oral BID Enid Baasadhika Kalisetti, MD   200 mg at 01/16/16 0945  . citalopram (CELEXA) tablet 20 mg  20 mg Oral Garen [id number] Radhika Kalisetti, MD   20 mg at 01/16/16 1001  . docusate sodium (COLACE) capsule 100 mg  100 mg Oral BID Enid Baasadhika Kalisetti, MD   100 mg at 01/16/16 0945  . enoxaparin (LOVENOX) injection 40 mg  40 mg Subcutaneous Q12H Jennye MoccasinBrian S Quigley, MD   40 mg at 01/16/16 1001  . ferrous sulfate tablet 325 mg  325 mg Oral Q breakfast Enid Baasadhika Kalisetti, MD   325 mg at 01/16/16 0945  . insulin aspart (novoLOG) injection 0-5 Units  0-5 Units Subcutaneous QHS  Enid Baasadhika Kalisetti, MD      . insulin aspart (novoLOG) injection 0-9 Units  0-9 Units Subcutaneous TID WC Enid Baasadhika Kalisetti, MD      . levothyroxine (SYNTHROID, LEVOTHROID) tablet 50 mcg  50 mcg Oral QAC breakfast Enid Baasadhika Kalisetti, MD      . liraglutide SOPN 0.6 mg  0.6 mg Subcutaneous [id number]-[id number] Enid Baasadhika Kalisetti, MD      . multivitamin with minerals tablet 1 tablet  1 tablet Oral [id number] Enid Baasadhika Kalisetti, MD   1 tablet at 01/16/16 1001  . nortriptyline (PAMELOR) capsule 75 mg  75 mg Oral QHS Enid Baasadhika Kalisetti, MD   75 mg at 01/15/16 2205  . ondansetron (ZOFRAN) tablet 4 mg  4 mg Oral Q6H PRN Enid Baasadhika Kalisetti, MD       Or  . ondansetron (ZOFRAN) injection 4 mg  4 mg Intravenous Q6H PRN Enid Baasadhika Kalisetti, MD      . pantoprazole (PROTONIX) EC tablet 40 mg  40 mg Oral QAC breakfast Enid Baasadhika Kalisetti, MD   40 mg at 01/16/16 0944  . senna (SENOKOT) tablet 8.6 mg  1 tablet Oral BID Enid Baasadhika Kalisetti, MD   8.6 mg at 01/16/16 0945  . traMADol (ULTRAM) tablet 50-100 mg  50-100 mg Oral Q4H PRN Enid Baasadhika Kalisetti, MD         Discharge Medications: Please see discharge summary for a list of discharge medications.  Relevant Imaging Results:  Relevant Lab Results:   Additional Information  (SSN: [id number])  Sample, Darleen CrockerBailey M, LCSW

## 2016-01-16 NOTE — Care Management Obs Status (Signed)
MEDICARE OBSERVATION STATUS NOTIFICATION   Patient Details  Name: Leslie Weber MRN: [id number] Date of Birth: 1940/11/26   Medicare Observation Status Notification Given:  No (discharge order less than 24 h)    Caren MacadamMichelle Avid Guillette, RN 01/16/2016, 2:21 PM

## 2016-01-16 NOTE — Progress Notes (Signed)
Report called to Kim at [facility]. EMS called for transportation.

## 2016-01-16 NOTE — Progress Notes (Signed)
   Subjective:    Patient reports pain as mild.   Patient is well, and has had no acute complaints or problems. Patient states she feels much better today. We will start therapy today.   As long as this is cleared by medicine Plan is to go Home after hospital stay. no nausea and no vomiting Patient denies any chest pains or shortness of breath. Objective: Vital signs in last 24 hours: Temp:  [98 F (36.7 C)-98.6 F (37 C)] 98.5 F (36.9 C) (10/27 0716) Pulse Rate:  [92-96] 95 (10/27 0716) Resp:  [10-18] 18 (10/27 0716) BP: (95-137)/(41-65) 137/61 (10/27 0716) SpO2:  [75 %-100 %] 96 % (10/27 0716) Weight:  [113.4 kg (250 lb)] 113.4 kg (250 lb) (10/26 1438) well approximated incision Heels are non tender and elevated off the bed using rolled towels Intake/Output from previous day: 10/26 0701 - 10/27 0700 In: 811.7 [P.O.:220; I.V.:591.7] Out: -  Intake/Output this shift: No intake/output data recorded.   Recent Labs  01/14/16 0337 01/15/16 1451 01/16/16 0437  HGB 10.1* 10.7* 10.6*    Recent Labs  01/15/16 1451 01/16/16 0437  WBC 11.4* 9.9  RBC 3.39* 3.33*  HCT 32.3* 32.0*  PLT 255 231    Recent Labs  01/15/16 1451 01/16/16 0437  NA 138 140  K 4.6 4.0  CL 104 106  CO2 29 29  BUN 32* 30*  CREATININE 1.58* 1.01*  GLUCOSE 124* 117*  CALCIUM 9.4 9.4   No results for input(s): LABPT, INR in the last 72 hours.  EXAM General - Patient is Alert, Appropriate and Oriented Extremity - Neurologically intact Neurovascular intact Sensation intact distally Intact pulses distally Dorsiflexion/Plantar flexion intact No cellulitis present Compartment soft Dressing - dressing C/D/I Motor Function - intact, moving foot and toes well on exam.    Past Medical History:  Diagnosis Date  . Arthritis   . Depression   . Diabetes mellitus without complication (HCC)   . Fever due to malaria    75 years old  . Fibromyalgia   . GERD (gastroesophageal reflux disease)    . History of hiatal hernia   . Hypertension   . Hypothyroidism   . Neuromuscular disorder (HCC)    Bells palsy left side of face  . Neuropathy of both feet   . Pneumonia 1997   history of walking pneumonia    Assessment/Plan:    Active Problems:   Altered mental status  Estimated body mass index is 45.73 kg/m as calculated from the following:   Height as of this encounter: 5\' 2"  (1.575 m).   Weight as of this encounter: 113.4 kg (250 lb). Advance diet  Labs: Were reviewed DVT Prophylaxis - Lovenox and TED hose Weight-Bearing as tolerated to right leg D/C O2 and Pulse OX and try on Room Air Once patient is cleared by medicine to begin physical therapy this is okay from orthopedic standpoint. Patient will need to continue with Lovenox as well as TED stockings. We'll need to follow-up as scheduled.  Lynnda ShieldsJon R. [facility]fe PA [facility] 01/16/2016, 8:04 AM

## 2016-01-16 NOTE — Progress Notes (Signed)
EMS here to transport pt. 

## 2016-01-16 NOTE — Evaluation (Signed)
Physical Therapy Evaluation Patient Details Name: MAJESTY STEHLIN MRN: [id number] DOB: 1940-03-23 Today's Date: 01/16/2016   History of Present Illness  Pt is a 75 y/o F s/p R TKA on 10/24.  After d/c from hospital pt became lethargic and unresponsive and was brought back to the hospital.  Pt's PMH includes depression, fibromyalgia, Bells palsy, neuropathy BLEs.      Clinical Impression  Pt admitted with above diagnosis. Pt currently with functional limitations due to the deficits listed below (see PT Problem List). Mrs. Straub presents lethargic and is quick to fatigue.  She completed therapeutic exercises with assist and ambulated 15 ft before reporting fatigue and the need to sit. SpO2 drops to 86% at rest supine in bed when on RA and pt required 2L O2 throughout duration of session.  She recently d/c home from the hospital after above surgery and was readmitted.  Given pt's current mobility status, recommending ST SNF prior to returning home.  Pt will benefit from skilled PT to increase their independence and safety with mobility to allow discharge to the venue listed below.      Follow Up Recommendations SNF    Equipment Recommendations  None recommended by PT    Recommendations for Other Services OT consult     Precautions / Restrictions Precautions Precautions: Fall Restrictions Weight Bearing Restrictions: Yes RLE Weight Bearing: Weight bearing as tolerated      Mobility  Bed Mobility Overal bed mobility: Needs Assistance Bed Mobility: Supine to Sit     Supine to sit: Min assist     General bed mobility comments: HOB flat and no use of bed rail. Cues for technique and sequencing and pt requires min assist for trunk elevation  Transfers Overall transfer level: Needs assistance Equipment used: Rolling walker (2 wheeled) Transfers: Sit to/from Stand Sit to Stand: Min guard         General transfer comment: Cues for hand placement and pt is very slow to stand.  Directional cues when backing up to sit in chair.  Ambulation/Gait Ambulation/Gait assistance: Min guard Ambulation Distance (Feet): 30 Feet Assistive device: Rolling walker (2 wheeled) Gait Pattern/deviations: Step-to pattern;Decreased stride length;Decreased stance time - right;Decreased step length - left;Antalgic;Trunk flexed Gait velocity: decreased Gait velocity interpretation: <1.8 ft/sec, indicative of risk for recurrent falls General Gait Details: Flexed posture despite cues for upright posture and forward gaze.  SpO2 remains at or above 90% on 2L O2.  Pt fatigues quickly and requests to sit after ambulating 15 ft.    Stairs            Wheelchair Mobility    Modified Rankin (Stroke Patients Only)       Balance Overall balance assessment: Needs assistance;History of Falls Sitting-balance support: No upper extremity supported;Feet supported Sitting balance-Leahy Scale: Good     Standing balance support: Bilateral upper extremity supported;During functional activity Standing balance-Leahy Scale: Poor Standing balance comment: Relies on RW for support                             Pertinent Vitals/Pain Pain Assessment: 0-10 Pain Score: 3  Pain Location: R knee, L great toe (chronic tingling) Pain Descriptors / Indicators: Aching;Discomfort Pain Intervention(s): Limited activity within patient's tolerance;Monitored during session;Repositioned;Ice applied    Home Living Family/patient expects to be discharged to:: Private residence Living Arrangements: Spouse/significant other Available Help at Discharge: Family;Available 24 hours/day Type of Home: House Home Access: Stairs to enter Entrance  Stairs-Rails: Can reach both;Left;Right Entrance Stairs-Number of Steps: 6 Home Layout: Multi-level;Able to live on main level with bedroom/bathroom Home Equipment: Shower seat - built in;Bedside commode;Toilet riser;Walker - 2 wheels;Transport chair;Cane - quad       Prior Function Level of Independence: Independent with assistive device(s)         Comments: Has needed RW for the past ~2 years.  Was only home from hospital <24 hrs.  When trying to get OOB with her husbands help her feet slid on the floor despite using gripper socks and pt slowly lowered to the floor on her bottom.  After this she took her medicine and became lethargic and unresponsive and was brought back to the hospital.     Hand Dominance   Dominant Hand: Right    Extremity/Trunk Assessment   Upper Extremity Assessment:  (swelling Bil forearms (pt's baseline), otherwise [facility])             RLE Deficits / Details: strength and ROM limited as expected 3 days post op       Communication   Communication: No difficulties  Cognition Arousal/Alertness: Lethargic Behavior During Therapy: WFL for tasks assessed/performed Overall Cognitive Status: Within Functional Limits for tasks assessed                      General Comments General comments (skin integrity, edema, etc.): Pt lethargic and keeps eyes closed most of session, except when ambualting.  SpO2 86% on RA resting comfortably in bed and 2L O2 was reapplied via [place].    Exercises Total Joint Exercises Ankle Circles/Pumps: AROM;Both;10 reps;Supine Quad Sets: Strengthening;Both;10 reps;Supine Gluteal Sets: Strengthening;Both;10 reps;Supine Long Arc Quad: AROM;10 reps;Seated;Right Knee Flexion: AAROM;Right;10 reps;Seated;Other (comment) (with 5 second holds) Goniometric ROM: ~ 85 deg R knee flexion with AAROM Other Exercises Other Exercises: Educated pt on and pt demonstrated pursed lip breathing throughout session with verbal cues   Assessment/Plan    PT Assessment Patient needs continued PT services  PT Problem List Decreased strength;Decreased range of motion;Decreased activity tolerance;Decreased balance;Decreased mobility;Decreased knowledge of use of DME;Decreased safety awareness;Decreased knowledge  of precautions;Pain          PT Treatment Interventions DME instruction;Gait training;Stair training;Functional mobility training;Therapeutic activities;Therapeutic exercise;Balance training;Neuromuscular re-education;Patient/family education;Modalities    PT Goals (Current goals can be found in the Care Plan section)  Acute Rehab PT Goals Patient Stated Goal: none stated, agreeable to rehab before home PT Goal Formulation: With patient/family Time For Goal Achievement: 01/23/16 Potential to Achieve Goals: Good    Frequency BID   Barriers to discharge Inaccessible home environment 6 steps to enter home    Co-evaluation               End of Session Equipment Utilized During Treatment: Gait belt;Oxygen Activity Tolerance: Patient limited by lethargy;Patient limited by fatigue Patient left: in chair;with call bell/phone within reach;with chair alarm set;with family/visitor present;Other (comment) (with polar ice in place) Nurse Communication: Mobility status;Other (comment) (SpO2, d/c recommendation)    Functional Assessment Tool Used: Clinical Judgement Functional Limitation: Mobility: Walking and moving around Mobility: Walking and Moving Around Current Status [phone]): At least 40 percent but less than 60 percent impaired, limited or restricted Mobility: Walking and Moving Around Goal Status [id number]): At least 20 percent but less than 40 percent impaired, limited or restricted    Time: 4782-9562 PT Time Calculation (min) (ACUTE ONLY): 53 min   Charges:   PT Evaluation $PT Eval Moderate Complexity: 1 Procedure PT Treatments $  Gait Training: 8-22 mins $Therapeutic Exercise: 8-22 mins $Therapeutic Activity: 8-22 mins   PT G Codes:   PT G-Codes **NOT FOR INPATIENT CLASS** Functional Assessment Tool Used: Clinical Judgement Functional Limitation: Mobility: Walking and moving around Mobility: Walking and Moving Around Current Status ([id number](G8978): At least 40 percent but less  than 60 percent impaired, limited or restricted Mobility: Walking and Moving Around Goal Status [phone]([id number]): At least 20 percent but less than 40 percent impaired, limited or restricted   Encarnacion ChuAshley Aiysha Jillson PT, DPT 01/16/2016, 11:05 AM

## 2016-01-16 NOTE — Progress Notes (Signed)
Checked to see if pre-authorization would be needed for non-emergent EMS transport. Per [employer] benefits obtained online through [employer], patient has a [employer] Medicare Advantage PPO policy.  Medicare PPO plans do not require pre-auth for non-emergent ground transports using service codes A0426 or A0428.   

## 2016-01-16 NOTE — Progress Notes (Signed)
Patient refuses scd's because of nerve pain and irritability. Patient educated on dvt prevention

## 2016-01-16 NOTE — Discharge Instructions (Signed)
Heart healthy and ADA diet. °Fall and aspiration precaution. °

## 2016-01-16 NOTE — Clinical Social Work Placement (Signed)
   CLINICAL SOCIAL WORK PLACEMENT  NOTE  Date:  01/16/2016  Patient Details  Name: Leslie Weber MRN: [id number] Date of Birth: 1940/11/27  Clinical Social Work is seeking post-discharge placement for this patient at the Skilled  Nursing Facility level of care (*CSW will initial, date and re-position this form in  chart as items are completed):  Yes   Patient/family provided with [facility] Clinical Social Work Department's list of facilities offering this level of care within the geographic area requested by the patient (or if unable, by the patient's family).  Yes   Patient/family informed of their freedom to choose among providers that offer the needed level of care, that participate in Medicare, Medicaid or managed care program needed by the patient, have an available bed and are willing to accept the patient.  Yes   Patient/family informed of [facility]'s ownership interest in [facility] and [facility], as well as of the fact that they are under no obligation to receive care at these facilities.  PASRR submitted to EDS on 01/15/16     PASRR number received on 01/15/16     Existing PASRR number confirmed on       FL2 transmitted to all facilities in geographic area requested by pt/family on 01/16/16     FL2 transmitted to all facilities within larger geographic area on       Patient informed that his/her managed care company has contracts with or will negotiate with certain facilities, including the following:        Yes   Patient/family informed of bed offers received.  Patient chooses bed at  [facility])     Physician recommends and patient chooses bed at  [facility](SNF)    Patient to be transferred to  ([facility]) on 01/16/16.  Patient to be transferred to facility by  (EMS)     Patient family notified on 01/16/16 of transfer.  Name of family member notified:   (Patient, husband, and daughter)     PHYSICIAN       Additional Comment:     _______________________________________________ York SpanielMonica Kaloni Bisaillon, LCSW 01/16/2016, 12:41 PM

## 2016-01-16 NOTE — Discharge Summary (Addendum)
[facility] NAME: Leslie Weber    MR#:  [id number]  DATE OF BIRTH:  May 25, 1940  DATE OF ADMISSION:  01/15/2016   ADMITTING PHYSICIAN: Gladstone Lighter, MD  DATE OF DISCHARGE: 01/16/2016  PRIMARY CARE PHYSICIAN: Madelyn Brunner, MD   ADMISSION DIAGNOSIS:  Altered mental status, unspecified altered mental status type [R41.82] DISCHARGE DIAGNOSIS:  Active Problems:   Altered mental status ARF and dehydration SECONDARY DIAGNOSIS:   Past Medical History:  Diagnosis Date  . Arthritis   . Depression   . Diabetes mellitus without complication ([place])   . Fever due to malaria    75 years old  . Fibromyalgia   . GERD (gastroesophageal reflux disease)   . History of hiatal hernia   . Hypertension   . Hypothyroidism   . Neuromuscular disorder ([place])    Bells palsy left side of face  . Neuropathy of both feet   . Pneumonia 1997   history of walking pneumonia   HOSPITAL COURSE:   Leslie Weber  is a 75 y.o. female with a known history of Depression diabetes mellitus, GERD, fibromyalgia, degenerative arthritis with the recent right total knee replacement surgery done on 01/12/2016 and got discharged home yesterday presents to hospital secondary to lethargy and confusion.  #1 altered mental status- likely toxic metabolic encephalopathy. Could be from pain medications and also dehydration. Improved with IV fluids. Hold blood pressure medications due to low BP. -Hold oxycodone. hold tramadol for pain. Also hold gabapentin. -CT of the head without any acute findings.  #2 degenerative arthritis with recent total right knee replacement surgery- postoperative day 3 today. -Ortho consulted. Keep the leg elevated and continue ice. Limited use of pain medications until mental status is more alert. -Continue Lovenox for DVT prophylaxis. Doppler of the right leg negative for any DVT. Patient has been on Lovenox to her surgery  #3  diabetes mellitus-hold Actos and metformin. on sliding scale insulin.  #4 hypertension-hold blood pressure medication Norvasc, lisinopril, hydrochlorothiazide at this time as blood pressure is low normal. Gentle hydration with IV fluids  UTI. Got rocephin, urine culture sent. Pt has incontinence but no dysuria or hematuria, po cipro bid 5 days after discharge.  Pt has weakness, multiple falls and low BS at home per her daughter.   DISCHARGE CONDITIONS:  Stable, discharge to SNF today. CONSULTS OBTAINED:  Treatment Team:  Dereck Leep, MD DRUG ALLERGIES:  No Known Allergies DISCHARGE MEDICATIONS:     Medication List    STOP taking these medications   amLODipine 10 MG tablet Commonly known as:  NORVASC   lisinopril-hydrochlorothiazide 20-12.5 MG tablet Commonly known as:  PRINZIDE,ZESTORETIC   oxyCODONE 5 MG immediate release tablet Commonly known as:  Oxy IR/ROXICODONE   pioglitazone-metformin 15-850 MG tablet Commonly known as:  ACTOPLUS MET     TAKE these medications   atorvastatin 40 MG tablet Commonly known as:  LIPITOR Take 40 mg by mouth every morning.   CALCIUM 600+D 600-800 MG-UNIT Tabs Generic drug:  Calcium Carb-Cholecalciferol Take 1 Dose by mouth 2 times daily at 12 noon and 4 pm.   celecoxib 200 MG capsule Commonly known as:  CELEBREX Take 200 mg by mouth 2 (two) times daily.   CENTRUM WOMEN PO Take 1 tablet by mouth every morning.   ciprofloxacin 500 MG tablet Commonly known as:  CIPRO Take 1 tablet (500 mg total) by mouth 2 (two) times daily.   citalopram 20 MG  tablet Commonly known as:  CELEXA Take 20 mg by mouth every morning.   enoxaparin 40 MG/0.4ML injection Commonly known as:  LOVENOX Inject 0.4 mLs (40 mg total) into the skin daily.   Ferrous Sulfate Dried 45 MG Tbcr Take 1 tablet by mouth every morning.   gabapentin 300 MG capsule Commonly known as:  NEURONTIN Take 300 mg by mouth 3 (three) times daily. 2 Capsules 3 times a  day.   levothyroxine 50 MCG tablet Commonly known as:  SYNTHROID, LEVOTHROID Take 50 mcg by mouth daily before breakfast.   nortriptyline 75 MG capsule Commonly known as:  PAMELOR Take 75 mg by mouth at bedtime.   pantoprazole 40 MG tablet Commonly known as:  PROTONIX Take 40 mg by mouth daily before breakfast.   traMADol 50 MG tablet Commonly known as:  ULTRAM Take 1 tablet (50 mg total) by mouth every 6 (six) hours as needed for moderate pain. What changed:  how much to take  when to take this   VICTOZA 18 MG/3ML Sopn Generic drug:  liraglutide Inject 0.6 mg into the skin every morning.        DISCHARGE INSTRUCTIONS:   OXYGEN:  Home Oxygen: [place] 1-2 L as need. DISCHARGE LOCATION:  SNF.  If you experience worsening of your admission symptoms, develop shortness of breath, life threatening emergency, suicidal or homicidal thoughts you must seek medical attention immediately by calling 911 or calling your MD immediately  if symptoms less severe.  You Must read complete instructions/literature along with all the possible adverse reactions/side effects for all the Medicines you take and that have been prescribed to you. Take any new Medicines after you have completely understood and accpet all the possible adverse reactions/side effects.   Please note  You were cared for by a hospitalist during your hospital stay. If you have any questions about your discharge medications or the care you received while you were in the hospital after you are discharged, you can call the unit and asked to speak with the hospitalist on call if the hospitalist that took care of you is not available. Once you are discharged, your primary care physician will handle any further medical issues. Please note that NO REFILLS for any discharge medications will be authorized once you are discharged, as it is imperative that you return to your primary care physician (or establish a relationship with a primary  care physician if you do not have one) for your aftercare needs so that they can reassess your need for medications and monitor your lab values.    On the day of Discharge:  VITAL SIGNS:  Blood pressure 137/61, pulse 95, temperature 98.5 F (36.9 C), temperature source Oral, resp. rate 18, height 5' 2"  (1.575 m), weight 250 lb (113.4 kg), SpO2 96 %. PHYSICAL EXAMINATION:  GENERAL:  75 y.o.-year-old patient lying in the bed with no acute distress. Obese. EYES: Pupils equal, round, reactive to light and accommodation. No scleral icterus. Extraocular muscles intact.  HEENT: Head atraumatic, normocephalic. Oropharynx and nasopharynx clear.  NECK:  Supple, no jugular venous distention. No thyroid enlargement, no tenderness.  LUNGS: Normal breath sounds bilaterally, no wheezing, rales,rhonchi or crepitation. No use of accessory muscles of respiration.  CARDIOVASCULAR: S1, S2 normal. No murmurs, rubs, or gallops.  ABDOMEN: Soft, non-tender, non-distended. Bowel sounds present. No organomegaly or mass.  EXTREMITIES: No pedal edema, cyanosis, or clubbing.  NEUROLOGIC: Cranial nerves II through XII are intact. Muscle strength 3-4/5 in all extremities. Sensation intact. Gait  not checked.  PSYCHIATRIC: The patient is alert and oriented x 3.  SKIN: No obvious rash, lesion, or ulcer.  DATA REVIEW:   CBC  Recent Labs Lab 01/16/16 0437  WBC 9.9  HGB 10.6*  HCT 32.0*  PLT 231    Chemistries   Recent Labs Lab 01/16/16 0437  NA 140  K 4.0  CL 106  CO2 29  GLUCOSE 117*  BUN 30*  CREATININE 1.01*  CALCIUM 9.4     Microbiology Results  Results for orders placed or performed during the hospital encounter of 12/31/15  Urine culture     Status: Abnormal   Collection Time: 12/31/15  9:30 AM  Result Value Ref Range Status   Specimen Description URINE, RANDOM  Final   Special Requests Normal  Final   Culture (A)  Final    <10,000 COLONIES/mL INSIGNIFICANT GROWTH Performed at [facility]    Report Status 01/01/2016 FINAL  Final  Surgical pcr screen     Status: None   Collection Time: 12/31/15  9:31 AM  Result Value Ref Range Status   MRSA, PCR NEGATIVE NEGATIVE Final   Staphylococcus aureus NEGATIVE NEGATIVE Final    Comment:        The Xpert SA Assay (FDA approved for NASAL specimens in patients over 47 years of age), is one component of a comprehensive surveillance program.  Test performance has been validated by [facility] for patients greater than or equal to 47 year old. It is not intended to diagnose infection nor to guide or monitor treatment.     RADIOLOGY:  Ct Head Wo Contrast  Result Date: 01/15/2016 CLINICAL DATA:  Sliding fall from bed this morning. Knee replacement Monday of this week. Husband assumed hypoglycemia and administered juice and 2 oxycodone. Patient presents at emergency room drowsy and with altered mental status. EXAM: CT HEAD WITHOUT CONTRAST TECHNIQUE: Contiguous axial images were obtained from the base of the skull through the vertex without intravenous contrast. COMPARISON:  None. FINDINGS: Brain: The brainstem, cerebellum, cerebral peduncles, thalami, basal ganglia, basilar cisterns, and ventricular system appear within normal limits. Periventricular white matter and corona radiata hypodensities favor chronic ischemic microvascular white matter disease. No intracranial hemorrhage, mass lesion, or acute CVA. Vascular: There is atherosclerotic calcification of the cavernous carotid arteries bilaterally. Skull: Unremarkable Sinuses/Orbits: Unremarkable Other: No supplemental non-categorized findings. IMPRESSION: 1. No acute intracranial findings. 2. Periventricular white matter and corona radiata hypodensities favor chronic ischemic microvascular white matter disease. Electronically Signed   By: Van Clines M.D.   On: 01/15/2016 17:22   US Venous Img Lower Bilateral  Result Date: 01/15/2016 CLINICAL DATA:  Bilateral lower  extremity swelling for 4 days. EXAM: BILATERAL LOWER EXTREMITY VENOUS DOPPLER ULTRASOUND TECHNIQUE: Gray-scale sonography with graded compression, as well as color Doppler and duplex ultrasound were performed to evaluate the lower extremity deep venous systems from the level of the common femoral vein and including the common femoral, femoral, profunda femoral, popliteal and calf veins including the posterior tibial, peroneal and gastrocnemius veins when visible. The superficial great saphenous vein was also interrogated. Spectral Doppler was utilized to evaluate flow at rest and with distal augmentation maneuvers in the common femoral, femoral and popliteal veins. COMPARISON:  None. FINDINGS: RIGHT LOWER EXTREMITY Common Femoral Vein: No evidence of thrombus. Normal compressibility, respiratory phasicity and response to augmentation. Saphenofemoral Junction: No evidence of thrombus. Normal compressibility and flow on color Doppler imaging. Profunda Femoral Vein: No evidence of thrombus. Normal compressibility and flow  on color Doppler imaging. Femoral Vein: No evidence of thrombus. Normal compressibility, respiratory phasicity and response to augmentation. Popliteal Vein: Unable to adequately evaluate a is the patient was unable to tolerate probe pressure for evaluation. Calf Veins: No evidence of thrombus. Normal compressibility and flow on color Doppler imaging. Superficial Great Saphenous Vein: No evidence of thrombus. Normal compressibility and flow on color Doppler imaging. Venous Reflux:  None. Other Findings:  None. LEFT LOWER EXTREMITY Common Femoral Vein: No evidence of thrombus. Normal compressibility, respiratory phasicity and response to augmentation. Saphenofemoral Junction: No evidence of thrombus. Normal compressibility and flow on color Doppler imaging. Profunda Femoral Vein: No evidence of thrombus. Normal compressibility and flow on color Doppler imaging. Femoral Vein: No evidence of thrombus.  Normal compressibility, respiratory phasicity and response to augmentation. Popliteal Vein: No evidence of thrombus. Normal compressibility, respiratory phasicity and response to augmentation. Calf Veins: No evidence of thrombus. Normal compressibility and flow on color Doppler imaging. Superficial Great Saphenous Vein: No evidence of thrombus. Normal compressibility and flow on color Doppler imaging. Venous Reflux:  None. Other Findings:  None. IMPRESSION: No evidence of deep venous thrombosis. Electronically Signed   By: Abelardo Diesel M.D.   On: 01/15/2016 18:15   Dg Chest Port 1 View  Result Date: 01/15/2016 CLINICAL DATA:  Shortness of breath EXAM: PORTABLE CHEST 1 VIEW COMPARISON:  10/17/2014 chest radiograph. FINDINGS: Low lung volumes. Stable cardiomediastinal silhouette with top-normal heart size and moderate hiatal hernia. No pneumothorax. No pleural effusion. Stable elevation of the right hemidiaphragm. Mild bibasilar atelectasis. No pulmonary edema. IMPRESSION: Low lung volumes.  Bibasilar atelectasis. Stable moderate hiatal hernia. Electronically Signed   By: Ilona Sorrel M.D.   On: 01/15/2016 16:57     Management plans discussed with the patient, her husband and daughter,  and they are in agreement.  CODE STATUS:     Code Status Orders        Start     Ordered   01/15/16 2007  Full code  Continuous     01/15/16 2006    Code Status History    Date Active Date Inactive Code Status Order ID Comments User Context   01/12/2016  3:43 PM 01/14/2016 10:03 PM Full Code [id number]  Dereck Leep, MD Inpatient    Advance Directive Documentation   Flowsheet Row Most Recent Value  Type of Advance Directive  Healthcare Power of Attorney, Living will  Pre-existing out of facility DNR order (yellow form or pink MOST form)  No data  "MOST" Form in Place?  No data      TOTAL TIME TAKING CARE OF THIS PATIENT: 37 minutes.    Demetrios Loll M.D on 01/16/2016 at 10:47 AM  Between 7am to 6pm  - Pager - [phone]  After 6pm go to [email or web address] - Proofreader  [facility]  Office  [phone]  CC: Primary care physician; Madelyn Brunner, MD   Note: This dictation was prepared with Dragon dictation along with smaller phrase technology. Any transcriptional errors that result from this process are unintentional.

## 2016-01-18 ENCOUNTER — Other Ambulatory Visit
Admission: RE | Admit: 2016-01-18 | Discharge: 2016-01-18 | Disposition: A | Discharge status: Home / Self Care | Payer: Medicare Other | Source: Ambulatory Visit | Attending: Family Medicine | Admitting: Family Medicine

## 2016-01-18 DIAGNOSIS — R05 Cough: Secondary | ICD-10-CM | POA: Diagnosis present

## 2016-01-19 LAB — LEGIONELLA PNEUMOPHILA SEROGP 1 UR AG: L. pneumophila Serogp 1 Ur Ag: NEGATIVE

## 2016-02-17 DIAGNOSIS — T8130XA Disruption of wound, unspecified, initial encounter: Secondary | ICD-10-CM | POA: Insufficient documentation

## 2016-03-01 ENCOUNTER — Other Ambulatory Visit
Admission: RE | Admit: 2016-03-01 | Discharge: 2016-03-01 | Disposition: A | Discharge status: Home / Self Care | Payer: Medicare Other | Source: Ambulatory Visit | Attending: Family Medicine | Admitting: Family Medicine

## 2016-03-01 DIAGNOSIS — N390 Urinary tract infection, site not specified: Secondary | ICD-10-CM | POA: Insufficient documentation

## 2016-03-01 DIAGNOSIS — E611 Iron deficiency: Secondary | ICD-10-CM | POA: Insufficient documentation

## 2016-03-01 DIAGNOSIS — E876 Hypokalemia: Secondary | ICD-10-CM | POA: Diagnosis present

## 2016-03-01 DIAGNOSIS — E039 Hypothyroidism, unspecified: Secondary | ICD-10-CM | POA: Diagnosis present

## 2016-03-01 DIAGNOSIS — E785 Hyperlipidemia, unspecified: Secondary | ICD-10-CM | POA: Diagnosis present

## 2016-03-01 DIAGNOSIS — R52 Pain, unspecified: Secondary | ICD-10-CM | POA: Insufficient documentation

## 2016-03-01 DIAGNOSIS — Z5189 Encounter for other specified aftercare: Secondary | ICD-10-CM | POA: Diagnosis present

## 2016-03-01 DIAGNOSIS — R05 Cough: Secondary | ICD-10-CM | POA: Insufficient documentation

## 2016-03-01 DIAGNOSIS — F3289 Other specified depressive episodes: Secondary | ICD-10-CM | POA: Insufficient documentation

## 2016-03-01 DIAGNOSIS — G9009 Other idiopathic peripheral autonomic neuropathy: Secondary | ICD-10-CM | POA: Insufficient documentation

## 2016-03-01 DIAGNOSIS — K219 Gastro-esophageal reflux disease without esophagitis: Secondary | ICD-10-CM | POA: Insufficient documentation

## 2016-03-01 DIAGNOSIS — E119 Type 2 diabetes mellitus without complications: Secondary | ICD-10-CM | POA: Insufficient documentation

## 2016-03-01 DIAGNOSIS — E559 Vitamin D deficiency, unspecified: Secondary | ICD-10-CM | POA: Diagnosis present

## 2016-03-01 DIAGNOSIS — K5900 Constipation, unspecified: Secondary | ICD-10-CM | POA: Insufficient documentation

## 2016-03-01 DIAGNOSIS — E569 Vitamin deficiency, unspecified: Secondary | ICD-10-CM | POA: Diagnosis present

## 2016-03-01 DIAGNOSIS — R609 Edema, unspecified: Secondary | ICD-10-CM | POA: Diagnosis present

## 2016-03-01 DIAGNOSIS — Z471 Aftercare following joint replacement surgery: Secondary | ICD-10-CM | POA: Insufficient documentation

## 2016-03-01 DIAGNOSIS — J9611 Chronic respiratory failure with hypoxia: Secondary | ICD-10-CM | POA: Diagnosis present

## 2016-03-01 DIAGNOSIS — I10 Essential (primary) hypertension: Secondary | ICD-10-CM | POA: Diagnosis present

## 2016-03-01 DIAGNOSIS — R110 Nausea: Secondary | ICD-10-CM | POA: Insufficient documentation

## 2016-03-01 DIAGNOSIS — M6281 Muscle weakness (generalized): Secondary | ICD-10-CM | POA: Diagnosis present

## 2016-03-01 DIAGNOSIS — M1990 Unspecified osteoarthritis, unspecified site: Secondary | ICD-10-CM | POA: Insufficient documentation

## 2016-03-01 LAB — CREATININE, SERUM
CREATININE: 1.21 mg/dL — AB (ref 0.44–1.00)
GFR calc Af Amer: 50 mL/min — ABNORMAL LOW (ref >60)
GFR calc non Af Amer: 43 mL/min — ABNORMAL LOW (ref >60)

## 2016-03-02 ENCOUNTER — Other Ambulatory Visit
Admission: RE | Admit: 2016-03-02 | Discharge: 2016-03-02 | Disposition: A | Discharge status: Home / Self Care | Payer: Medicare Other | Source: Ambulatory Visit | Attending: Family Medicine | Admitting: Family Medicine

## 2016-03-02 DIAGNOSIS — R52 Pain, unspecified: Secondary | ICD-10-CM | POA: Insufficient documentation

## 2016-03-02 DIAGNOSIS — M1990 Unspecified osteoarthritis, unspecified site: Secondary | ICD-10-CM | POA: Insufficient documentation

## 2016-03-02 DIAGNOSIS — E559 Vitamin D deficiency, unspecified: Secondary | ICD-10-CM | POA: Insufficient documentation

## 2016-03-02 DIAGNOSIS — K219 Gastro-esophageal reflux disease without esophagitis: Secondary | ICD-10-CM | POA: Insufficient documentation

## 2016-03-02 DIAGNOSIS — R110 Nausea: Secondary | ICD-10-CM | POA: Diagnosis present

## 2016-03-02 DIAGNOSIS — E611 Iron deficiency: Secondary | ICD-10-CM | POA: Insufficient documentation

## 2016-03-02 DIAGNOSIS — M6281 Muscle weakness (generalized): Secondary | ICD-10-CM | POA: Insufficient documentation

## 2016-03-02 DIAGNOSIS — E119 Type 2 diabetes mellitus without complications: Secondary | ICD-10-CM | POA: Insufficient documentation

## 2016-03-02 DIAGNOSIS — N390 Urinary tract infection, site not specified: Secondary | ICD-10-CM | POA: Insufficient documentation

## 2016-03-02 DIAGNOSIS — G9009 Other idiopathic peripheral autonomic neuropathy: Secondary | ICD-10-CM | POA: Diagnosis present

## 2016-03-02 DIAGNOSIS — I10 Essential (primary) hypertension: Secondary | ICD-10-CM | POA: Insufficient documentation

## 2016-03-02 DIAGNOSIS — R05 Cough: Secondary | ICD-10-CM | POA: Diagnosis present

## 2016-03-02 DIAGNOSIS — Z5189 Encounter for other specified aftercare: Secondary | ICD-10-CM | POA: Insufficient documentation

## 2016-03-02 DIAGNOSIS — E039 Hypothyroidism, unspecified: Secondary | ICD-10-CM | POA: Insufficient documentation

## 2016-03-02 DIAGNOSIS — J9611 Chronic respiratory failure with hypoxia: Secondary | ICD-10-CM | POA: Diagnosis present

## 2016-03-02 DIAGNOSIS — E876 Hypokalemia: Secondary | ICD-10-CM | POA: Diagnosis present

## 2016-03-02 DIAGNOSIS — E785 Hyperlipidemia, unspecified: Secondary | ICD-10-CM | POA: Insufficient documentation

## 2016-03-02 DIAGNOSIS — E569 Vitamin deficiency, unspecified: Secondary | ICD-10-CM | POA: Diagnosis present

## 2016-03-02 DIAGNOSIS — R609 Edema, unspecified: Secondary | ICD-10-CM | POA: Insufficient documentation

## 2016-03-02 DIAGNOSIS — F3289 Other specified depressive episodes: Secondary | ICD-10-CM | POA: Insufficient documentation

## 2016-03-02 DIAGNOSIS — Z471 Aftercare following joint replacement surgery: Secondary | ICD-10-CM | POA: Insufficient documentation

## 2016-03-02 DIAGNOSIS — K5900 Constipation, unspecified: Secondary | ICD-10-CM | POA: Insufficient documentation

## 2016-03-02 LAB — CREATININE, SERUM
Creatinine, Ser: 1.44 mg/dL — ABNORMAL HIGH (ref 0.44–1.00)
GFR calc Af Amer: 41 mL/min — ABNORMAL LOW (ref >60)
GFR calc non Af Amer: 35 mL/min — ABNORMAL LOW (ref >60)

## 2016-03-02 LAB — VANCOMYCIN, TROUGH
Vancomycin Tr: 41 ug/mL (ref 15–20)
Vancomycin Tr: 30 ug/mL (ref 15–20)

## 2016-03-04 ENCOUNTER — Other Ambulatory Visit
Admission: RE | Admit: 2016-03-04 | Discharge: 2016-03-04 | Disposition: A | Discharge status: Home / Self Care | Payer: Medicare Other | Source: Ambulatory Visit | Attending: Family Medicine | Admitting: Family Medicine

## 2016-03-04 DIAGNOSIS — R05 Cough: Secondary | ICD-10-CM | POA: Diagnosis present

## 2016-03-04 DIAGNOSIS — Z5189 Encounter for other specified aftercare: Secondary | ICD-10-CM | POA: Diagnosis present

## 2016-03-04 DIAGNOSIS — Z471 Aftercare following joint replacement surgery: Secondary | ICD-10-CM | POA: Diagnosis present

## 2016-03-04 DIAGNOSIS — N390 Urinary tract infection, site not specified: Secondary | ICD-10-CM | POA: Insufficient documentation

## 2016-03-04 DIAGNOSIS — E559 Vitamin D deficiency, unspecified: Secondary | ICD-10-CM | POA: Insufficient documentation

## 2016-03-04 DIAGNOSIS — R52 Pain, unspecified: Secondary | ICD-10-CM | POA: Insufficient documentation

## 2016-03-04 DIAGNOSIS — E119 Type 2 diabetes mellitus without complications: Secondary | ICD-10-CM | POA: Diagnosis present

## 2016-03-04 DIAGNOSIS — G9009 Other idiopathic peripheral autonomic neuropathy: Secondary | ICD-10-CM | POA: Diagnosis present

## 2016-03-04 DIAGNOSIS — J9611 Chronic respiratory failure with hypoxia: Secondary | ICD-10-CM | POA: Insufficient documentation

## 2016-03-04 DIAGNOSIS — E611 Iron deficiency: Secondary | ICD-10-CM | POA: Diagnosis present

## 2016-03-04 DIAGNOSIS — R110 Nausea: Secondary | ICD-10-CM | POA: Insufficient documentation

## 2016-03-04 DIAGNOSIS — M6281 Muscle weakness (generalized): Secondary | ICD-10-CM | POA: Insufficient documentation

## 2016-03-04 DIAGNOSIS — R609 Edema, unspecified: Secondary | ICD-10-CM | POA: Diagnosis present

## 2016-03-04 DIAGNOSIS — E569 Vitamin deficiency, unspecified: Secondary | ICD-10-CM | POA: Diagnosis present

## 2016-03-04 DIAGNOSIS — M1990 Unspecified osteoarthritis, unspecified site: Secondary | ICD-10-CM | POA: Insufficient documentation

## 2016-03-04 DIAGNOSIS — F3289 Other specified depressive episodes: Secondary | ICD-10-CM | POA: Insufficient documentation

## 2016-03-04 DIAGNOSIS — E876 Hypokalemia: Secondary | ICD-10-CM | POA: Diagnosis present

## 2016-03-04 DIAGNOSIS — K219 Gastro-esophageal reflux disease without esophagitis: Secondary | ICD-10-CM | POA: Insufficient documentation

## 2016-03-04 DIAGNOSIS — E785 Hyperlipidemia, unspecified: Secondary | ICD-10-CM | POA: Insufficient documentation

## 2016-03-04 DIAGNOSIS — K5900 Constipation, unspecified: Secondary | ICD-10-CM | POA: Diagnosis present

## 2016-03-04 DIAGNOSIS — I10 Essential (primary) hypertension: Secondary | ICD-10-CM | POA: Diagnosis present

## 2016-03-04 DIAGNOSIS — E039 Hypothyroidism, unspecified: Secondary | ICD-10-CM | POA: Insufficient documentation

## 2016-03-04 LAB — CREATININE, SERUM
CREATININE: 1.51 mg/dL — AB (ref 0.44–1.00)
GFR calc Af Amer: 38 mL/min — ABNORMAL LOW (ref >60)
GFR calc non Af Amer: 33 mL/min — ABNORMAL LOW (ref >60)

## 2016-03-04 LAB — VANCOMYCIN, TROUGH: VANCOMYCIN TR: 19 ug/mL (ref 15–20)

## 2016-03-06 ENCOUNTER — Other Ambulatory Visit
Admission: RE | Admit: 2016-03-06 | Discharge: 2016-03-06 | Disposition: A | Discharge status: Home / Self Care | Payer: Medicare Other | Source: Skilled Nursing Facility | Attending: Family Medicine | Admitting: Family Medicine

## 2016-03-06 DIAGNOSIS — R197 Diarrhea, unspecified: Secondary | ICD-10-CM | POA: Insufficient documentation

## 2016-03-06 LAB — C DIFFICILE QUICK SCREEN W PCR REFLEX
C DIFFICLE (CDIFF) ANTIGEN: NEGATIVE
C Diff interpretation: NOT DETECTED
C Diff toxin: NEGATIVE

## 2016-03-09 ENCOUNTER — Other Ambulatory Visit
Admission: RE | Admit: 2016-03-09 | Discharge: 2016-03-09 | Disposition: A | Discharge status: Home / Self Care | Payer: Medicare Other | Source: Ambulatory Visit | Attending: Family Medicine | Admitting: Family Medicine

## 2016-03-09 DIAGNOSIS — Z5181 Encounter for therapeutic drug level monitoring: Secondary | ICD-10-CM | POA: Diagnosis present

## 2016-03-09 LAB — CREATININE, SERUM
Creatinine, Ser: 1.13 mg/dL — ABNORMAL HIGH (ref 0.44–1.00)
GFR calc Af Amer: 54 mL/min — ABNORMAL LOW (ref >60)
GFR, EST NON AFRICAN AMERICAN: 47 mL/min — AB (ref >60)

## 2016-03-09 LAB — VANCOMYCIN, TROUGH: Vancomycin Tr: 22 ug/mL (ref 15–20)

## 2016-03-16 ENCOUNTER — Other Ambulatory Visit
Admission: RE | Admit: 2016-03-16 | Discharge: 2016-03-16 | Disposition: A | Discharge status: Home / Self Care | Payer: Medicare Other | Source: Ambulatory Visit | Attending: Family Medicine | Admitting: Family Medicine

## 2016-03-16 DIAGNOSIS — R52 Pain, unspecified: Secondary | ICD-10-CM | POA: Insufficient documentation

## 2016-03-16 DIAGNOSIS — K5900 Constipation, unspecified: Secondary | ICD-10-CM | POA: Insufficient documentation

## 2016-03-16 DIAGNOSIS — N390 Urinary tract infection, site not specified: Secondary | ICD-10-CM | POA: Insufficient documentation

## 2016-03-16 DIAGNOSIS — E119 Type 2 diabetes mellitus without complications: Secondary | ICD-10-CM | POA: Insufficient documentation

## 2016-03-16 DIAGNOSIS — E785 Hyperlipidemia, unspecified: Secondary | ICD-10-CM | POA: Diagnosis present

## 2016-03-16 DIAGNOSIS — K219 Gastro-esophageal reflux disease without esophagitis: Secondary | ICD-10-CM | POA: Diagnosis present

## 2016-03-16 DIAGNOSIS — R110 Nausea: Secondary | ICD-10-CM | POA: Insufficient documentation

## 2016-03-16 DIAGNOSIS — R609 Edema, unspecified: Secondary | ICD-10-CM | POA: Diagnosis present

## 2016-03-16 DIAGNOSIS — G9009 Other idiopathic peripheral autonomic neuropathy: Secondary | ICD-10-CM | POA: Diagnosis present

## 2016-03-16 DIAGNOSIS — E569 Vitamin deficiency, unspecified: Secondary | ICD-10-CM | POA: Insufficient documentation

## 2016-03-16 DIAGNOSIS — R05 Cough: Secondary | ICD-10-CM | POA: Diagnosis present

## 2016-03-16 DIAGNOSIS — F3289 Other specified depressive episodes: Secondary | ICD-10-CM | POA: Diagnosis present

## 2016-03-16 DIAGNOSIS — J9611 Chronic respiratory failure with hypoxia: Secondary | ICD-10-CM | POA: Insufficient documentation

## 2016-03-16 DIAGNOSIS — Z5189 Encounter for other specified aftercare: Secondary | ICD-10-CM | POA: Diagnosis present

## 2016-03-16 DIAGNOSIS — E039 Hypothyroidism, unspecified: Secondary | ICD-10-CM | POA: Insufficient documentation

## 2016-03-16 DIAGNOSIS — I10 Essential (primary) hypertension: Secondary | ICD-10-CM | POA: Insufficient documentation

## 2016-03-16 DIAGNOSIS — Z471 Aftercare following joint replacement surgery: Secondary | ICD-10-CM | POA: Diagnosis present

## 2016-03-16 DIAGNOSIS — E611 Iron deficiency: Secondary | ICD-10-CM | POA: Diagnosis present

## 2016-03-16 DIAGNOSIS — M6281 Muscle weakness (generalized): Secondary | ICD-10-CM | POA: Diagnosis present

## 2016-03-16 DIAGNOSIS — M1990 Unspecified osteoarthritis, unspecified site: Secondary | ICD-10-CM | POA: Insufficient documentation

## 2016-03-16 DIAGNOSIS — E559 Vitamin D deficiency, unspecified: Secondary | ICD-10-CM | POA: Insufficient documentation

## 2016-03-16 DIAGNOSIS — E876 Hypokalemia: Secondary | ICD-10-CM | POA: Diagnosis present

## 2016-03-16 LAB — CREATININE, SERUM
Creatinine, Ser: 1.31 mg/dL — ABNORMAL HIGH (ref 0.44–1.00)
GFR calc non Af Amer: 39 mL/min — ABNORMAL LOW (ref >60)
GFR, EST AFRICAN AMERICAN: 46 mL/min — AB (ref >60)

## 2016-03-16 LAB — VANCOMYCIN, TROUGH: Vancomycin Tr: 26 ug/mL (ref 15–20)

## 2016-03-18 ENCOUNTER — Encounter
Admission: RE | Admit: 2016-03-18 | Discharge: 2016-03-18 | Disposition: A | Discharge status: Home / Self Care | Payer: Medicare Other | Source: Ambulatory Visit | Attending: Orthopedic Surgery | Admitting: Orthopedic Surgery

## 2016-03-18 DIAGNOSIS — Z01812 Encounter for preprocedural laboratory examination: Secondary | ICD-10-CM | POA: Insufficient documentation

## 2016-03-18 HISTORY — DX: Atherosclerotic heart disease of native coronary artery without angina pectoris: I25.10

## 2016-03-18 HISTORY — DX: Sleep apnea, unspecified: G47.30

## 2016-03-18 LAB — CBC WITH DIFFERENTIAL/PLATELET
Basophils Absolute: 0.1 10*3/uL (ref 0–0.1)
Basophils Relative: 1 %
Eosinophils Absolute: 0.5 10*3/uL (ref 0–0.7)
Eosinophils Relative: 6 %
HEMATOCRIT: 31.1 % — AB (ref 35.0–47.0)
HEMOGLOBIN: 10.2 g/dL — AB (ref 12.0–16.0)
LYMPHS ABS: 1.2 10*3/uL (ref 1.0–3.6)
Lymphocytes Relative: 14 %
MCH: 29.7 pg (ref 26.0–34.0)
MCHC: 32.7 g/dL (ref 32.0–36.0)
MCV: 90.9 fL (ref 80.0–100.0)
MONOS PCT: 13 %
Monocytes Absolute: 1.1 10*3/uL — ABNORMAL HIGH (ref 0.2–0.9)
NEUTROS PCT: 66 %
Neutro Abs: 5.7 10*3/uL (ref 1.4–6.5)
Platelets: 110 10*3/uL — ABNORMAL LOW (ref 150–440)
RBC: 3.42 MIL/uL — ABNORMAL LOW (ref 3.80–5.20)
RDW: 15.7 % — ABNORMAL HIGH (ref 11.5–14.5)
WBC: 8.6 10*3/uL (ref 3.6–11.0)

## 2016-03-18 LAB — COMPREHENSIVE METABOLIC PANEL
ALBUMIN: 4.1 g/dL (ref 3.5–5.0)
ALK PHOS: 89 U/L (ref 38–126)
ALT: 13 U/L — ABNORMAL LOW (ref 14–54)
ANION GAP: 10 (ref 5–15)
AST: 22 U/L (ref 15–41)
BILIRUBIN TOTAL: 0.5 mg/dL (ref 0.3–1.2)
BUN: 22 mg/dL — ABNORMAL HIGH (ref 6–20)
CALCIUM: 10.5 mg/dL — AB (ref 8.9–10.3)
CO2: 26 mmol/L (ref 22–32)
Chloride: 101 mmol/L (ref 101–111)
Creatinine, Ser: 1.53 mg/dL — ABNORMAL HIGH (ref 0.44–1.00)
GFR calc non Af Amer: 33 mL/min — ABNORMAL LOW (ref >60)
GFR, EST AFRICAN AMERICAN: 38 mL/min — AB (ref >60)
GLUCOSE: 100 mg/dL — AB (ref 65–99)
Potassium: 4.6 mmol/L (ref 3.5–5.1)
Sodium: 137 mmol/L (ref 135–145)
TOTAL PROTEIN: 7.4 g/dL (ref 6.5–8.1)

## 2016-03-18 LAB — SURGICAL PCR SCREEN
MRSA, PCR: NEGATIVE
STAPHYLOCOCCUS AUREUS: NEGATIVE

## 2016-03-18 LAB — SEDIMENTATION RATE: Sed Rate: 40 mm/hr — ABNORMAL HIGH (ref 0–30)

## 2016-03-18 LAB — PROTIME-INR
INR: 1
Prothrombin Time: 13.2 seconds (ref 11.4–15.2)

## 2016-03-18 LAB — C-REACTIVE PROTEIN: CRP: <0.8 mg/dL (ref <1.0)

## 2016-03-18 LAB — TYPE AND SCREEN
ABO/RH(D): A POS
Antibody Screen: NEGATIVE
EXTEND SAMPLE REASON: TRANSFUSED

## 2016-03-18 LAB — APTT: aPTT: 26 seconds (ref 24–36)

## 2016-03-18 NOTE — Pre-Procedure Instructions (Signed)
Component Name Value Ref Range  LV Ejection Fraction (%) 55   Aortic Valve Stenosis Grade mild   Aortic Valve Regurgitation Grade trivial   Mitral Valve Stenosis Grade none   Mitral Valve Regurgitation Grade mild   Tricuspid Valve Regurgitation Grade mild   Tricuspid Valve Regurgitation Max Velocity (m/s) 3 m/sec   Right Ventricle Systolic Pressure (mmHg) 46.0 mmHg   LV End Diastolic Diameter (cm) 5.1 cm  LV End Systolic Diameter (cm) 3.7 cm  LV Septum Wall Thickness (cm) 0.8 cm  LV Posterior Wall Thickness (cm) 0.9 cm  Left Atrium Diameter (cm) 4.9 cm  Result Narrative  INTERNAL MEDICINE DEPARTMENT Leslie Weber, Leslie Weber  [facility] [id number] [facility] Acct #: [id number] [address], [place] [id number]: 12/13/2014 10:15 AM  Adult Female Age: 75 yrs ECHOCARDIOGRAM REPORTOutpatient  STUDY:CHEST WALL TAPE:0000:00: 0:00:00KC::KCWI ECHO:Yes DOPPLER:YesFILE:0000-000-000MD1:FLEMING, HERBON E  COLOR:YesCONTRAST:No MACHINE:PhilipsHeight: 62 in  RV BIOPSY:No 3D:NoSOUND QLTY:Moderate Weight: 256 lb MEDIUM:None  BSA: 2.1 m2 ___________________________________________________________________________________________  HISTORY:DOE REASON:Assess, LV function INDICATION:R06.02 -SOB SHORT OF BREATH, 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 IVC:nm* [ <= 2.1]  FS:27.5 %[> 25]Min. IVC:nm* SWT:0.80 cm [0.5 - 0.9] ------------------ PWT:0.90 cm [0.5 - 0.9] nm* - not measured  LEFT ATRIUM LA Diam:4.9 cm[2.7 - 3.8] LA A4C Area:nm* [ < 20] LA Volume:nm* [22 - 52]  ___________________________________________________________________________________________ ECHOCARDIOGRAPHIC DESCRIPTIONS  AORTIC ROOT Size:Normal Dissection:INDETERM FOR DISSECTION  AORTIC VALVE Leaflets:Tricuspid Morphology:MILDLY THICKENED Mobility:PARTIALLY MOBILE  LEFT VENTRICLE Size:NormalAnterior:Normal  Contraction:Normal Lateral:Normal Closest EF:>55% (Estimated)Septal:Normal  LV Masses:No Masses Apical:Normal  BJY:NWGNFAOZHYQM:VHQIONLVH:NoneInferior:Normal Posterior:Normal Dias.FxClass:(Grade 1) relaxation abnormal, E/A reversal  MITRAL VALVE Leaflets:NormalMobility:Fully mobile Morphology:ANNULAR CALC  LEFT ATRIUM Size:MILDLY ENLARGEDLA Masses:No masses  IA Septum:Normal IAS  MAIN PA Size:Normal  PULMONIC  VALVE Morphology:NormalMobility:Fully mobile  RIGHT VENTRICLE  RV Masses:No Masses Size:MILDLY ENLARGED  Free Wall:Normal Contraction:Normal  TRICUSPID VALVE Leaflets:NormalMobility:Fully mobile Morphology:ANNULAR CALC  RIGHT ATRIUM Size:NormalRA Other:None  RA Mass:No masses  PERICARDIUM  Fluid:No effusion  INFERIOR VENACAVA Size:DILATED Normal respiratory collapse   _____________________________________________________________________ DOPPLER ECHO and OTHER SPECIAL PROCEDURES  Aortic:TRIVIAL AR MILD AS   Mitral:MILD MRNo MS MV Inflow E Vel=116.0 cm/sec MV Annulus E'Vel=5.1 cm/sec E/E'Ratio=22.9  Tricuspid:MILD TRNo TS 300.0 cm/sec peak TR vel 46.0 mmHg peak RV pressure  Pulmonary:MILD PRNo PS     ___________________________________________________________________________________________ INTERPRETATION NORMAL LEFT VENTRICULAR SYSTOLIC FUNCTION NORMAL RIGHT VENTRICULAR SYSTOLIC FUNCTION MILD VALVULAR REGURGITATION (See above) MILD VALVULAR STENOSIS (See above) Mild AS with peak transortic veolcity 2.5341m/s   ___________________________________________________________________________________________ Electronically signed by: Danella PentonMark F Miller, MD on 12/16/2014 02:05 PM Performed By: Rayetta HumphreyMucker, Sharika M, RCS Ordering Physician: Mertie MooresFLEMING, HERBON E  ___________________________________________________________________________________________  Status Results Details    [facility]' [email or web address]/">Encounter Summary

## 2016-03-18 NOTE — Patient Instructions (Signed)
  Your procedure is scheduled on: 03/19/16 Fri @ 6:00 am Report to Same Day Surgery 2nd floor medical mall [facility](Medical Mall Entrance-take elevator on left to 2nd floor.  Check in with surgery information desk.) To find out your arrival time please call [phone] between 1PM - 3PM on   Remember: Instructions that are not followed completely may result in serious medical risk, up to and including death, or upon the discretion of your surgeon and anesthesiologist your surgery may need to be rescheduled.    _x___ 1. Do not eat food or drink liquids after midnight. No gum chewing or hard candies.     __x__ 2. No Alcohol for 24 hours before or after surgery.   __x__3. No Smoking for 24 prior to surgery.   ____  4. Bring all medications with you on the day of surgery if instructed.    __x__ 5. Notify your doctor if there is any change in your medical condition     (cold, fever, infections).     Do not wear jewelry, make-up, hairpins, clips or nail polish.  Do not wear lotions, powders, or perfumes. You may wear deodorant.  Do not shave 48 hours prior to surgery. Men may shave face and neck.  Do not bring valuables to the hospital.    [facility] any belongings or valuables.               Contacts, dentures or bridgework may not be worn into surgery.  Leave your suitcase in the car. After surgery it may be brought to your room.  For patients admitted to the hospital, discharge time is determined by your treatment team.   Patients discharged the day of surgery will not be allowed to drive home.  You will need someone to drive you home and stay with you the night of your procedure.    Please read over the following fact sheets that you were given:   [facility] Preparing for [facility] and or MRSA Information   _x___ Take these medicines the morning of surgery with A SIP OF WATER:    1. atorvastatin (LIPITOR  2.citalopram (CELEXA  3.gabapentin  (NEURONTIN)  4.levothyroxine (SYNTHROID,  5.pantoprazole (PROTONIX  6.  ____Fleets enema or Magnesium Citrate as directed.   _x___ Use CHG Soap or sage wipes as directed on instruction sheet   ____ Use inhalers on the day of surgery and bring to hospital day of surgery  ____ Stop metformin 2 days prior to surgery    ____ Take 1/2 of usual insulin dose the night before surgery and none on the morning of           surgery.   ____ Stop Aspirin, Coumadin, Pllavix ,Eliquis, Effient, or Pradaxa  x__ Stop Anti-inflammatories such as Advil, Aleve, Ibuprofen, Motrin, Naproxen,          Naprosyn, Goodies powders or aspirin products. Ok to take Tylenol.   ____ Stop supplements until after surgery.    ____ Bring C-Pap to the hospital.

## 2016-03-18 NOTE — Pre-Procedure Instructions (Signed)
Met B, CBC, Sed rate, & platelets sent to Dr. Marry Guan and Anesthesia for review.

## 2016-03-19 ENCOUNTER — Ambulatory Visit: Payer: Medicare Other | Admitting: Registered Nurse

## 2016-03-19 ENCOUNTER — Ambulatory Visit: Payer: Medicare Other

## 2016-03-19 ENCOUNTER — Encounter
Admission: RE | Disposition: A | Discharge status: Skilled Nursing Facility | Payer: Self-pay | Source: Ambulatory Visit | Attending: Orthopedic Surgery

## 2016-03-19 ENCOUNTER — Encounter: Payer: Self-pay | Admitting: Orthopedic Surgery

## 2016-03-19 ENCOUNTER — Inpatient Hospital Stay
Admission: RE | Admit: 2016-03-19 | Discharge: 2016-03-22 | DRG: 493 | Disposition: A | Discharge status: Skilled Nursing Facility | Payer: Medicare Other | Source: Ambulatory Visit | Attending: Orthopedic Surgery | Admitting: Orthopedic Surgery

## 2016-03-19 DIAGNOSIS — F419 Anxiety disorder, unspecified: Secondary | ICD-10-CM | POA: Diagnosis present

## 2016-03-19 DIAGNOSIS — F329 Major depressive disorder, single episode, unspecified: Secondary | ICD-10-CM | POA: Diagnosis present

## 2016-03-19 DIAGNOSIS — T8489XA Other specified complication of internal orthopedic prosthetic devices, implants and grafts, initial encounter: Principal | ICD-10-CM | POA: Diagnosis present

## 2016-03-19 DIAGNOSIS — M797 Fibromyalgia: Secondary | ICD-10-CM | POA: Diagnosis present

## 2016-03-19 DIAGNOSIS — D62 Acute posthemorrhagic anemia: Secondary | ICD-10-CM | POA: Diagnosis not present

## 2016-03-19 DIAGNOSIS — Y831 Surgical operation with implant of artificial internal device as the cause of abnormal reaction of the patient, or of later complication, without mention of misadventure at the time of the procedure: Secondary | ICD-10-CM | POA: Diagnosis present

## 2016-03-19 DIAGNOSIS — I10 Essential (primary) hypertension: Secondary | ICD-10-CM | POA: Diagnosis present

## 2016-03-19 DIAGNOSIS — E1141 Type 2 diabetes mellitus with diabetic mononeuropathy: Secondary | ICD-10-CM | POA: Diagnosis present

## 2016-03-19 DIAGNOSIS — Z419 Encounter for procedure for purposes other than remedying health state, unspecified: Secondary | ICD-10-CM

## 2016-03-19 DIAGNOSIS — Z7984 Long term (current) use of oral hypoglycemic drugs: Secondary | ICD-10-CM

## 2016-03-19 DIAGNOSIS — E1121 Type 2 diabetes mellitus with diabetic nephropathy: Secondary | ICD-10-CM | POA: Diagnosis present

## 2016-03-19 DIAGNOSIS — E039 Hypothyroidism, unspecified: Secondary | ICD-10-CM | POA: Diagnosis present

## 2016-03-19 DIAGNOSIS — W19XXXD Unspecified fall, subsequent encounter: Secondary | ICD-10-CM | POA: Diagnosis present

## 2016-03-19 DIAGNOSIS — I2510 Atherosclerotic heart disease of native coronary artery without angina pectoris: Secondary | ICD-10-CM | POA: Diagnosis present

## 2016-03-19 DIAGNOSIS — Z90710 Acquired absence of both cervix and uterus: Secondary | ICD-10-CM | POA: Diagnosis not present

## 2016-03-19 DIAGNOSIS — G4730 Sleep apnea, unspecified: Secondary | ICD-10-CM | POA: Diagnosis present

## 2016-03-19 DIAGNOSIS — Z8701 Personal history of pneumonia (recurrent): Secondary | ICD-10-CM

## 2016-03-19 DIAGNOSIS — Z800 Family history of malignant neoplasm of digestive organs: Secondary | ICD-10-CM | POA: Diagnosis not present

## 2016-03-19 DIAGNOSIS — Z96651 Presence of right artificial knee joint: Secondary | ICD-10-CM | POA: Diagnosis present

## 2016-03-19 DIAGNOSIS — Z801 Family history of malignant neoplasm of trachea, bronchus and lung: Secondary | ICD-10-CM | POA: Diagnosis not present

## 2016-03-19 DIAGNOSIS — M25561 Pain in right knee: Secondary | ICD-10-CM | POA: Diagnosis present

## 2016-03-19 DIAGNOSIS — N179 Acute kidney failure, unspecified: Secondary | ICD-10-CM | POA: Diagnosis present

## 2016-03-19 DIAGNOSIS — K219 Gastro-esophageal reflux disease without esophagitis: Secondary | ICD-10-CM | POA: Diagnosis present

## 2016-03-19 DIAGNOSIS — S82851P Displaced trimalleolar fracture of right lower leg, subsequent encounter for closed fracture with malunion: Secondary | ICD-10-CM | POA: Diagnosis not present

## 2016-03-19 DIAGNOSIS — S82009A Unspecified fracture of unspecified patella, initial encounter for closed fracture: Secondary | ICD-10-CM | POA: Diagnosis present

## 2016-03-19 HISTORY — PX: ORIF ANKLE FRACTURE: SHX5408

## 2016-03-19 HISTORY — PX: ORIF PATELLA: SHX5033

## 2016-03-19 LAB — HEMOGLOBIN A1C
Hgb A1c MFr Bld: 5.6 % (ref 4.8–5.6)
Mean Plasma Glucose: 114 mg/dL

## 2016-03-19 LAB — GLUCOSE, CAPILLARY
GLUCOSE-CAPILLARY: 181 mg/dL — AB (ref 65–99)
GLUCOSE-CAPILLARY: 108 mg/dL — AB (ref 65–99)
Glucose-Capillary: 96 mg/dL (ref 65–99)

## 2016-03-19 SURGERY — OPEN REDUCTION INTERNAL FIXATION (ORIF) PATELLA
Anesthesia: General | Site: Knee | Laterality: Right | Wound class: Clean

## 2016-03-19 MED ORDER — NORTRIPTYLINE HCL 25 MG PO CAPS
25.0000 mg | ORAL_CAPSULE | Freq: Every day | ORAL | Status: DC
  Administered 2016-03-19 – 2016-03-21 (×3): 25 mg via ORAL
  Filled 2016-03-19 (×3): qty 1

## 2016-03-19 MED ORDER — ROCURONIUM BROMIDE 50 MG/5ML IV SOSY
PREFILLED_SYRINGE | INTRAVENOUS | Status: AC
  Filled 2016-03-19: qty 5

## 2016-03-19 MED ORDER — NEOMYCIN-POLYMYXIN B GU 40-200000 IR SOLN
Status: AC
  Filled 2016-03-19: qty 2

## 2016-03-19 MED ORDER — FLEET ENEMA 7-19 GM/118ML RE ENEM: 1.0000 | ENEMA | Freq: Once | RECTAL | Status: DC | PRN

## 2016-03-19 MED ORDER — LACTATED RINGERS IV SOLN
INTRAVENOUS | Status: DC | PRN
  Administered 2016-03-19: 11:00:00 via INTRAVENOUS

## 2016-03-19 MED ORDER — LIDOCAINE 2% (20 MG/ML) 5 ML SYRINGE
INTRAMUSCULAR | Status: AC
  Filled 2016-03-19: qty 5

## 2016-03-19 MED ORDER — FENTANYL CITRATE (PF) 250 MCG/5ML IJ SOLN
INTRAMUSCULAR | Status: AC
  Filled 2016-03-19: qty 5

## 2016-03-19 MED ORDER — DEXAMETHASONE SODIUM PHOSPHATE 10 MG/ML IJ SOLN
INTRAMUSCULAR | Status: DC | PRN
  Administered 2016-03-19: 10 mg via INTRAVENOUS

## 2016-03-19 MED ORDER — PHENYLEPHRINE HCL 10 MG/ML IJ SOLN
INTRAMUSCULAR | Status: DC | PRN
  Administered 2016-03-19: 100 ug via INTRAVENOUS
  Administered 2016-03-19: 150 ug via INTRAVENOUS
  Administered 2016-03-19: 200 ug via INTRAVENOUS
  Administered 2016-03-19: 100 ug via INTRAVENOUS
  Administered 2016-03-19 (×2): 200 ug via INTRAVENOUS
  Administered 2016-03-19: 50 ug via INTRAVENOUS
  Administered 2016-03-19 (×2): 100 ug via INTRAVENOUS

## 2016-03-19 MED ORDER — MAGNESIUM OXIDE 400 (241.3 MG) MG PO TABS
400.0000 mg | ORAL_TABLET | Freq: Three times a day (TID) | ORAL | Status: DC
  Administered 2016-03-19 – 2016-03-22 (×8): 400 mg via ORAL
  Filled 2016-03-19 (×8): qty 1

## 2016-03-19 MED ORDER — ENOXAPARIN SODIUM 30 MG/0.3ML ~~LOC~~ SOLN
30.0000 mg | Freq: Two times a day (BID) | SUBCUTANEOUS | Status: DC
  Administered 2016-03-20 – 2016-03-22 (×5): 30 mg via SUBCUTANEOUS
  Filled 2016-03-19 (×4): qty 0.3

## 2016-03-19 MED ORDER — DIPHENHYDRAMINE HCL 12.5 MG/5ML PO ELIX: 12.5000 mg | ORAL_SOLUTION | ORAL | Status: DC | PRN

## 2016-03-19 MED ORDER — ONDANSETRON HCL 4 MG/2ML IJ SOLN
INTRAMUSCULAR | Status: DC | PRN
  Administered 2016-03-19: 4 mg via INTRAVENOUS

## 2016-03-19 MED ORDER — ACETAMINOPHEN 10 MG/ML IV SOLN
INTRAVENOUS | Status: AC
  Filled 2016-03-19: qty 100

## 2016-03-19 MED ORDER — ALUM & MAG HYDROXIDE-SIMETH 200-200-20 MG/5ML PO SUSP: 30.0000 mL | ORAL | Status: DC | PRN

## 2016-03-19 MED ORDER — FENTANYL CITRATE (PF) 100 MCG/2ML IJ SOLN
25.0000 ug | INTRAMUSCULAR | Status: DC | PRN
  Administered 2016-03-19 (×4): 25 ug via INTRAVENOUS

## 2016-03-19 MED ORDER — CEFAZOLIN SODIUM-DEXTROSE 2-4 GM/100ML-% IV SOLN
INTRAVENOUS | Status: AC
  Filled 2016-03-19: qty 100

## 2016-03-19 MED ORDER — SUGAMMADEX SODIUM 200 MG/2ML IV SOLN
INTRAVENOUS | Status: DC | PRN
  Administered 2016-03-19: 200 mg via INTRAVENOUS

## 2016-03-19 MED ORDER — TRAMADOL HCL 50 MG PO TABS
50.0000 mg | ORAL_TABLET | ORAL | Status: DC | PRN
  Administered 2016-03-19 (×2): 100 mg via ORAL
  Administered 2016-03-20: 50 mg via ORAL
  Administered 2016-03-20 – 2016-03-22 (×3): 100 mg via ORAL
  Filled 2016-03-19: qty 1
  Filled 2016-03-19 (×5): qty 2

## 2016-03-19 MED ORDER — ONDANSETRON HCL 4 MG/2ML IJ SOLN: 4.0000 mg | Freq: Four times a day (QID) | INTRAMUSCULAR | Status: DC | PRN

## 2016-03-19 MED ORDER — PROPOFOL 10 MG/ML IV BOLUS
INTRAVENOUS | Status: AC
  Filled 2016-03-19: qty 20

## 2016-03-19 MED ORDER — VASOPRESSIN 20 UNIT/ML IV SOLN
INTRAVENOUS | Status: AC
  Filled 2016-03-19: qty 1

## 2016-03-19 MED ORDER — CITALOPRAM HYDROBROMIDE 20 MG PO TABS
20.0000 mg | ORAL_TABLET | ORAL | Status: DC
  Administered 2016-03-20 – 2016-03-22 (×3): 20 mg via ORAL
  Filled 2016-03-19 (×3): qty 1

## 2016-03-19 MED ORDER — SODIUM CHLORIDE FLUSH 0.9 % IV SOLN
INTRAVENOUS | Status: AC
  Filled 2016-03-19: qty 10

## 2016-03-19 MED ORDER — MIDAZOLAM HCL 2 MG/2ML IJ SOLN
INTRAMUSCULAR | Status: DC | PRN
  Administered 2016-03-19: 1 mg via INTRAVENOUS

## 2016-03-19 MED ORDER — VANCOMYCIN HCL IN DEXTROSE 1-5 GM/200ML-% IV SOLN
1000.0000 mg | INTRAVENOUS | Status: AC
  Administered 2016-03-19: 1000 mg via INTRAVENOUS
  Filled 2016-03-19: qty 200

## 2016-03-19 MED ORDER — METHYLPREDNISOLONE SODIUM SUCC 125 MG IJ SOLR
INTRAMUSCULAR | Status: DC | PRN
  Administered 2016-03-19: 125 mg via INTRAVENOUS

## 2016-03-19 MED ORDER — ADULT MULTIVITAMIN W/MINERALS CH
1.0000 | ORAL_TABLET | ORAL | Status: DC
  Administered 2016-03-19 – 2016-03-22 (×4): 1 via ORAL
  Filled 2016-03-19 (×4): qty 1

## 2016-03-19 MED ORDER — SENNOSIDES-DOCUSATE SODIUM 8.6-50 MG PO TABS
1.0000 | ORAL_TABLET | Freq: Two times a day (BID) | ORAL | Status: DC
  Administered 2016-03-19 – 2016-03-22 (×7): 1 via ORAL
  Filled 2016-03-19 (×7): qty 1

## 2016-03-19 MED ORDER — SODIUM CHLORIDE 0.9 % IV SOLN
300.0000 mg | Freq: Two times a day (BID) | INTRAVENOUS | Status: DC
  Administered 2016-03-19 – 2016-03-22 (×7): 300 mg via INTRAVENOUS
  Filled 2016-03-19 (×8): qty 300

## 2016-03-19 MED ORDER — GABAPENTIN 300 MG PO CAPS
600.0000 mg | ORAL_CAPSULE | Freq: Three times a day (TID) | ORAL | Status: DC
  Administered 2016-03-19 – 2016-03-22 (×9): 600 mg via ORAL
  Filled 2016-03-19 (×9): qty 2

## 2016-03-19 MED ORDER — SODIUM CHLORIDE 0.9 % IV SOLN
INTRAVENOUS | Status: AC
  Administered 2016-03-19: 17:00:00 via INTRAVENOUS

## 2016-03-19 MED ORDER — DEXAMETHASONE SODIUM PHOSPHATE 10 MG/ML IJ SOLN
INTRAMUSCULAR | Status: AC
  Filled 2016-03-19: qty 1

## 2016-03-19 MED ORDER — ACETAMINOPHEN 10 MG/ML IV SOLN
1000.0000 mg | Freq: Four times a day (QID) | INTRAVENOUS | Status: AC
  Administered 2016-03-19 – 2016-03-20 (×4): 1000 mg via INTRAVENOUS
  Filled 2016-03-19 (×4): qty 100

## 2016-03-19 MED ORDER — FENTANYL CITRATE (PF) 100 MCG/2ML IJ SOLN
INTRAMUSCULAR | Status: AC
  Administered 2016-03-19: 25 ug via INTRAVENOUS
  Filled 2016-03-19: qty 2

## 2016-03-19 MED ORDER — LIDOCAINE HCL (CARDIAC) 20 MG/ML IV SOLN
INTRAVENOUS | Status: DC | PRN
  Administered 2016-03-19: 60 mg via INTRAVENOUS

## 2016-03-19 MED ORDER — EPHEDRINE 5 MG/ML INJ
INTRAVENOUS | Status: AC
  Filled 2016-03-19: qty 10

## 2016-03-19 MED ORDER — FERROUS SULFATE 325 (65 FE) MG PO TABS
325.0000 mg | ORAL_TABLET | Freq: Two times a day (BID) | ORAL | Status: DC
  Administered 2016-03-19 – 2016-03-22 (×5): 325 mg via ORAL
  Filled 2016-03-19 (×5): qty 1

## 2016-03-19 MED ORDER — INSULIN ASPART 100 UNIT/ML ~~LOC~~ SOLN
0.0000 [IU] | Freq: Three times a day (TID) | SUBCUTANEOUS | Status: DC
  Administered 2016-03-20: 3 [IU] via SUBCUTANEOUS
  Administered 2016-03-22: 2 [IU] via SUBCUTANEOUS
  Filled 2016-03-19: qty 3
  Filled 2016-03-19: qty 2

## 2016-03-19 MED ORDER — VASOPRESSIN 20 UNIT/ML IV SOLN
INTRAVENOUS | Status: DC | PRN
  Administered 2016-03-19 (×2): 1 [IU] via INTRAVENOUS

## 2016-03-19 MED ORDER — FENTANYL CITRATE (PF) 100 MCG/2ML IJ SOLN
INTRAMUSCULAR | Status: AC
  Filled 2016-03-19: qty 2

## 2016-03-19 MED ORDER — PHENOL 1.4 % MT LIQD
1.0000 | OROMUCOSAL | Status: DC | PRN
  Filled 2016-03-19: qty 177

## 2016-03-19 MED ORDER — PROPOFOL 10 MG/ML IV BOLUS
INTRAVENOUS | Status: DC | PRN
  Administered 2016-03-19: 120 mg via INTRAVENOUS

## 2016-03-19 MED ORDER — BUPIVACAINE-EPINEPHRINE (PF) 0.25% -1:200000 IJ SOLN
INTRAMUSCULAR | Status: AC
  Filled 2016-03-19: qty 30

## 2016-03-19 MED ORDER — BUPIVACAINE HCL (PF) 0.25 % IJ SOLN
INTRAMUSCULAR | Status: AC
  Filled 2016-03-19: qty 30

## 2016-03-19 MED ORDER — CELECOXIB 200 MG PO CAPS
200.0000 mg | ORAL_CAPSULE | Freq: Two times a day (BID) | ORAL | Status: DC
  Administered 2016-03-19 – 2016-03-22 (×6): 200 mg via ORAL
  Filled 2016-03-19 (×6): qty 1

## 2016-03-19 MED ORDER — ACETAMINOPHEN 325 MG PO TABS
650.0000 mg | ORAL_TABLET | Freq: Four times a day (QID) | ORAL | Status: DC | PRN
  Administered 2016-03-21 (×2): 650 mg via ORAL
  Filled 2016-03-19 (×3): qty 2

## 2016-03-19 MED ORDER — PANTOPRAZOLE SODIUM 40 MG PO TBEC
40.0000 mg | DELAYED_RELEASE_TABLET | Freq: Two times a day (BID) | ORAL | Status: DC
  Administered 2016-03-19 – 2016-03-22 (×6): 40 mg via ORAL
  Filled 2016-03-19 (×7): qty 1

## 2016-03-19 MED ORDER — SODIUM CHLORIDE 0.9 % IJ SOLN
INTRAMUSCULAR | Status: AC
  Filled 2016-03-19: qty 20

## 2016-03-19 MED ORDER — ESMOLOL HCL 100 MG/10ML IV SOLN
INTRAVENOUS | Status: DC | PRN
  Administered 2016-03-19 (×2): 20 mg via INTRAVENOUS
  Administered 2016-03-19: 10 mg via INTRAVENOUS
  Administered 2016-03-19 (×6): 20 mg via INTRAVENOUS
  Administered 2016-03-19: 10 mg via INTRAVENOUS
  Administered 2016-03-19: 20 mg via INTRAVENOUS

## 2016-03-19 MED ORDER — PHENYLEPHRINE HCL 10 MG/ML IJ SOLN
INTRAMUSCULAR | Status: AC
  Filled 2016-03-19: qty 1

## 2016-03-19 MED ORDER — ONDANSETRON HCL 4 MG/2ML IJ SOLN: 4.0000 mg | Freq: Once | INTRAMUSCULAR | Status: DC | PRN

## 2016-03-19 MED ORDER — EPHEDRINE SULFATE 50 MG/ML IJ SOLN
INTRAMUSCULAR | Status: DC | PRN
  Administered 2016-03-19: 5 mg via INTRAVENOUS
  Administered 2016-03-19 (×2): 10 mg via INTRAVENOUS
  Administered 2016-03-19: 20 mg via INTRAVENOUS
  Administered 2016-03-19 (×2): 15 mg via INTRAVENOUS
  Administered 2016-03-19 (×2): 10 mg via INTRAVENOUS

## 2016-03-19 MED ORDER — FUROSEMIDE 40 MG PO TABS
40.0000 mg | ORAL_TABLET | Freq: Every day | ORAL | Status: DC
  Administered 2016-03-19 – 2016-03-22 (×4): 40 mg via ORAL
  Filled 2016-03-19 (×4): qty 1

## 2016-03-19 MED ORDER — ROCURONIUM BROMIDE 100 MG/10ML IV SOLN
INTRAVENOUS | Status: DC | PRN
  Administered 2016-03-19 (×2): 20 mg via INTRAVENOUS
  Administered 2016-03-19 (×2): 10 mg via INTRAVENOUS
  Administered 2016-03-19 (×3): 20 mg via INTRAVENOUS
  Administered 2016-03-19: 10 mg via INTRAVENOUS
  Administered 2016-03-19: 30 mg via INTRAVENOUS

## 2016-03-19 MED ORDER — CEFAZOLIN SODIUM-DEXTROSE 2-4 GM/100ML-% IV SOLN
2.0000 g | INTRAVENOUS | Status: AC
  Administered 2016-03-19: 2 g via INTRAVENOUS

## 2016-03-19 MED ORDER — MAGNESIUM HYDROXIDE 400 MG/5ML PO SUSP: 30.0000 mL | Freq: Every day | ORAL | Status: DC | PRN

## 2016-03-19 MED ORDER — ACETAMINOPHEN 10 MG/ML IV SOLN
INTRAVENOUS | Status: DC | PRN
  Administered 2016-03-19: 1000 mg via INTRAVENOUS

## 2016-03-19 MED ORDER — LEVOTHYROXINE SODIUM 75 MCG PO TABS
75.0000 ug | ORAL_TABLET | Freq: Every day | ORAL | Status: DC
  Administered 2016-03-20 – 2016-03-22 (×3): 75 ug via ORAL
  Filled 2016-03-19 (×3): qty 1

## 2016-03-19 MED ORDER — FENTANYL CITRATE (PF) 100 MCG/2ML IJ SOLN
INTRAMUSCULAR | Status: DC | PRN
  Administered 2016-03-19: 50 ug via INTRAVENOUS
  Administered 2016-03-19: 100 ug via INTRAVENOUS
  Administered 2016-03-19: 50 ug via INTRAVENOUS
  Administered 2016-03-19: 25 ug via INTRAVENOUS
  Administered 2016-03-19: 50 ug via INTRAVENOUS
  Administered 2016-03-19 (×3): 25 ug via INTRAVENOUS

## 2016-03-19 MED ORDER — EPINEPHRINE PF 1 MG/ML IJ SOLN
INTRAMUSCULAR | Status: AC
  Filled 2016-03-19: qty 1

## 2016-03-19 MED ORDER — ONDANSETRON HCL 4 MG/2ML IJ SOLN
INTRAMUSCULAR | Status: AC
  Filled 2016-03-19: qty 2

## 2016-03-19 MED ORDER — BISACODYL 10 MG RE SUPP: 10.0000 mg | Freq: Every day | RECTAL | Status: DC | PRN

## 2016-03-19 MED ORDER — CHLORHEXIDINE GLUCONATE 4 % EX LIQD: 60.0000 mL | Freq: Once | CUTANEOUS | Status: DC

## 2016-03-19 MED ORDER — BUPIVACAINE HCL 0.25 % IJ SOLN
INTRAMUSCULAR | Status: DC | PRN
  Administered 2016-03-19: 30 mL

## 2016-03-19 MED ORDER — MIDAZOLAM HCL 2 MG/2ML IJ SOLN
INTRAMUSCULAR | Status: AC
  Filled 2016-03-19: qty 2

## 2016-03-19 MED ORDER — SODIUM CHLORIDE 0.9 % IV SOLN
INTRAVENOUS | Status: DC
  Administered 2016-03-19: 07:00:00 via INTRAVENOUS

## 2016-03-19 MED ORDER — TRANEXAMIC ACID 1000 MG/10ML IV SOLN
1000.0000 mg | Freq: Once | INTRAVENOUS | Status: AC
  Administered 2016-03-19: 1000 mg via INTRAVENOUS
  Filled 2016-03-19: qty 10

## 2016-03-19 MED ORDER — CALCIUM CARBONATE-VITAMIN D 500-200 MG-UNIT PO TABS
1.0000 | ORAL_TABLET | Freq: Two times a day (BID) | ORAL | Status: DC
  Administered 2016-03-19 – 2016-03-22 (×4): 1 via ORAL
  Filled 2016-03-19 (×4): qty 1

## 2016-03-19 MED ORDER — SODIUM CHLORIDE 0.9 % IV SOLN
INTRAVENOUS | Status: DC | PRN
  Administered 2016-03-19: 30 ug/min via INTRAVENOUS

## 2016-03-19 MED ORDER — ALBUTEROL SULFATE HFA 108 (90 BASE) MCG/ACT IN AERS
INHALATION_SPRAY | RESPIRATORY_TRACT | Status: AC
  Filled 2016-03-19: qty 6.7

## 2016-03-19 MED ORDER — ATORVASTATIN CALCIUM 20 MG PO TABS
40.0000 mg | ORAL_TABLET | ORAL | Status: DC
  Administered 2016-03-20 – 2016-03-22 (×3): 40 mg via ORAL
  Filled 2016-03-19 (×3): qty 2

## 2016-03-19 MED ORDER — ALBUTEROL SULFATE HFA 108 (90 BASE) MCG/ACT IN AERS
INHALATION_SPRAY | RESPIRATORY_TRACT | Status: DC | PRN
  Administered 2016-03-19: 3 via RESPIRATORY_TRACT

## 2016-03-19 MED ORDER — POTASSIUM CHLORIDE CRYS ER 10 MEQ PO TBCR
15.0000 meq | EXTENDED_RELEASE_TABLET | Freq: Two times a day (BID) | ORAL | Status: DC
  Administered 2016-03-19 – 2016-03-22 (×7): 15 meq via ORAL
  Filled 2016-03-19 (×7): qty 2

## 2016-03-19 MED ORDER — ACETAMINOPHEN 650 MG RE SUPP: 650.0000 mg | Freq: Four times a day (QID) | RECTAL | Status: DC | PRN

## 2016-03-19 MED ORDER — METHYLPREDNISOLONE SODIUM SUCC 125 MG IJ SOLR
INTRAMUSCULAR | Status: AC
  Filled 2016-03-19: qty 2

## 2016-03-19 MED ORDER — VANCOMYCIN HCL IN DEXTROSE 1-5 GM/200ML-% IV SOLN
1000.0000 mg | INTRAVENOUS | Status: DC
  Administered 2016-03-20 – 2016-03-22 (×3): 1000 mg via INTRAVENOUS
  Filled 2016-03-19 (×4): qty 200

## 2016-03-19 MED ORDER — ESMOLOL HCL 100 MG/10ML IV SOLN
INTRAVENOUS | Status: AC
  Filled 2016-03-19: qty 10

## 2016-03-19 MED ORDER — MENTHOL 3 MG MT LOZG
1.0000 | LOZENGE | OROMUCOSAL | Status: DC | PRN
  Filled 2016-03-19: qty 9

## 2016-03-19 MED ORDER — FERROUS SULFATE 325 (65 FE) MG PO TABS: 325.0000 mg | ORAL_TABLET | ORAL | Status: DC

## 2016-03-19 MED ORDER — ONDANSETRON HCL 4 MG PO TABS: 4.0000 mg | ORAL_TABLET | Freq: Four times a day (QID) | ORAL | Status: DC | PRN

## 2016-03-19 MED ORDER — METOCLOPRAMIDE HCL 10 MG PO TABS
10.0000 mg | ORAL_TABLET | Freq: Three times a day (TID) | ORAL | Status: AC
  Administered 2016-03-19 – 2016-03-21 (×6): 10 mg via ORAL
  Filled 2016-03-19 (×6): qty 1

## 2016-03-19 MED ORDER — NEOMYCIN-POLYMYXIN B GU 40-200000 IR SOLN
Status: DC | PRN
  Administered 2016-03-19: 2 mL

## 2016-03-19 SURGICAL SUPPLY — 78 items
BANDAGE ACE 4X5 VEL STRL LF (GAUZE/BANDAGES/DRESSINGS) ×4 IMPLANT
BIT DRILL 2.5X110 QC LCP DISP (BIT) ×4 IMPLANT
BNDG COHESIVE 4X5 TAN STRL (GAUZE/BANDAGES/DRESSINGS) ×4 IMPLANT
BNDG ESMARK 6X12 TAN STRL LF (GAUZE/BANDAGES/DRESSINGS) ×4 IMPLANT
CANISTER SUCT 1200ML W/VALVE (MISCELLANEOUS) ×4 IMPLANT
COOLER POLAR GLACIER W/PUMP (MISCELLANEOUS) ×4 IMPLANT
CUFF TOURN 24 STER (MISCELLANEOUS) IMPLANT
CUFF TOURN 30 STER DUAL PORT (MISCELLANEOUS) ×8 IMPLANT
DECANTER SPIKE VIAL GLASS SM (MISCELLANEOUS) ×4 IMPLANT
DRAPE BILAT LIMB 76X120 89291 (MISCELLANEOUS) ×4 IMPLANT
DRAPE C-ARM XRAY 36X54 (DRAPES) ×4 IMPLANT
DRAPE C-ARMOR (DRAPES) ×4 IMPLANT
DRAPE FLUOR MINI C-ARM 54X84 (DRAPES) ×4 IMPLANT
DRAPE SHEET LG 3/4 BI-LAMINATE (DRAPES) ×8 IMPLANT
DRAPE U-SHAPE 47X51 STRL (DRAPES) ×4 IMPLANT
DRSG DERMACEA 8X12 NADH (GAUZE/BANDAGES/DRESSINGS) ×4 IMPLANT
DURAPREP 26ML APPLICATOR (WOUND CARE) ×8 IMPLANT
ELECT CAUTERY BLADE 6.4 (BLADE) ×4 IMPLANT
ELECT REM PT RETURN 9FT ADLT (ELECTROSURGICAL) ×4
ELECTRODE REM PT RTRN 9FT ADLT (ELECTROSURGICAL) ×2 IMPLANT
GAUZE SPONGE 4X4 12PLY STRL (GAUZE/BANDAGES/DRESSINGS) ×4 IMPLANT
GLOVE BIO SURGEON STRL SZ8 (GLOVE) ×4 IMPLANT
GLOVE BIOGEL M STRL SZ7.5 (GLOVE) ×4 IMPLANT
GLOVE SURG XRAY 8.5 LX (GLOVE) ×4 IMPLANT
GOWN SRG 2XL LVL 4 RGLN SLV (GOWNS) ×2 IMPLANT
GOWN STRL NON-REIN 2XL LVL4 (GOWNS) ×2
GOWN STRL REUS W/ TWL LRG LVL3 (GOWN DISPOSABLE) ×4 IMPLANT
GOWN STRL REUS W/TWL LRG LVL3 (GOWN DISPOSABLE) ×4
HEMOVAC 400CC 10FR (MISCELLANEOUS) ×4 IMPLANT
IV CATH ANGIO 14GX3.25 ORG (MISCELLANEOUS) ×8 IMPLANT
KIT RM TURNOVER STRD PROC AR (KITS) ×4 IMPLANT
LABEL OR SOLS (LABEL) ×4 IMPLANT
NEEDLE HYPO 25X1 1.5 SAFETY (NEEDLE) ×4 IMPLANT
NS IRRIG 500ML POUR BTL (IV SOLUTION) ×4 IMPLANT
PACK EXTREMITY ARMC (MISCELLANEOUS) ×4 IMPLANT
PACK TOTAL KNEE (MISCELLANEOUS) ×4 IMPLANT
PAD ABD DERMACEA PRESS 5X9 (GAUZE/BANDAGES/DRESSINGS) ×12 IMPLANT
PAD CAST CTTN 4X4 STRL (SOFTGOODS) ×6 IMPLANT
PAD PREP 24X41 OB/GYN DISP (PERSONAL CARE ITEMS) ×4 IMPLANT
PAD WRAPON POLAR ANKLE (MISCELLANEOUS) ×2 IMPLANT
PADDING CAST COTTON 4X4 STRL (SOFTGOODS) ×6
PLATE LCP 3.5 1/3 TUB 8HX93 (Plate) ×4 IMPLANT
SCREW CORTEX 3.5 10MM (Screw) IMPLANT
SCREW CORTEX 3.5 12MM (Screw) IMPLANT
SCREW CORTEX 3.5 14MM (Screw) ×8 IMPLANT
SCREW CORTEX 3.5 16MM (Screw) ×4 IMPLANT
SCREW CORTEX 3.5 18MM (Screw) ×4 IMPLANT
SCREW LOCK CORT ST 3.5X10 (Screw) IMPLANT
SCREW LOCK CORT ST 3.5X12 (Screw) IMPLANT
SCREW LOCK CORT ST 3.5X14 (Screw) ×8 IMPLANT
SCREW LOCK CORT ST 3.5X16 (Screw) ×4 IMPLANT
SCREW LOCK CORT ST 3.5X18 (Screw) ×4 IMPLANT
SOL PREP PVP 2OZ (MISCELLANEOUS) ×4
SOLUTION PREP PVP 2OZ (MISCELLANEOUS) ×2 IMPLANT
SPLINT CAST 1 STEP 5X30 WHT (MISCELLANEOUS) ×4 IMPLANT
SPONGE LAP 18X18 5 PK (GAUZE/BANDAGES/DRESSINGS) ×4 IMPLANT
STAPLER SKIN PROX 35W (STAPLE) ×4 IMPLANT
STOCKINETTE IMPERVIOUS LG (DRAPES) ×4 IMPLANT
STOCKINETTE M/LG 89821 (MISCELLANEOUS) ×4 IMPLANT
STOCKINETTE STRL 6IN 960660 (GAUZE/BANDAGES/DRESSINGS) ×4 IMPLANT
SUT FIBERWIRE #5 38 CONV BLUE (SUTURE) ×8
SUT ORTHOCORD W/MULTIPK NDL (SUTURE) ×4 IMPLANT
SUT STEEL 7 (SUTURE) ×4 IMPLANT
SUT VIC AB 0 CT1 27 (SUTURE) ×2
SUT VIC AB 0 CT1 27XCR 8 STRN (SUTURE) ×2 IMPLANT
SUT VIC AB 0 CT1 36 (SUTURE) ×8 IMPLANT
SUT VIC AB 2-0 CT1 27 (SUTURE) ×2
SUT VIC AB 2-0 CT1 TAPERPNT 27 (SUTURE) ×2 IMPLANT
SUT VIC AB 2-0 SH 27 (SUTURE) ×2
SUT VIC AB 2-0 SH 27XBRD (SUTURE) ×2 IMPLANT
SUTURE FIBERWR #5 38 CONV BLUE (SUTURE) ×4 IMPLANT
SYR 30ML LL (SYRINGE) ×4 IMPLANT
SYRINGE 10CC LL (SYRINGE) ×4 IMPLANT
WIRE CERLCAGE COILS 1.0 10 (WIRE) ×4 IMPLANT
WIRE Z .062 C-WIRE SPADE TIP (WIRE) ×8 IMPLANT
WRAPON POLAR PAD ANKLE (MISCELLANEOUS) ×4
c-wire .062 ×4 IMPLANT
c-wire.062 ×3 IMPLANT

## 2016-03-19 NOTE — H&P (Signed)
The patient has been re-examined, and the chart reviewed, and there have been no interval changes to the documented history and physical.    The risks, benefits, and alternatives have been discussed at length. The patient expressed understanding of the risks benefits and agreed with plans for surgical intervention.  James P. Hooten, Jr. M.D.    

## 2016-03-19 NOTE — Anesthesia Preprocedure Evaluation (Signed)
Anesthesia Evaluation  Patient identified by MRN, date of birth, ID band Patient awake    Reviewed: Allergy & Precautions, H&P , NPO status , Patient's Chart, lab work & pertinent test results  Airway Mallampati: III  TM Distance: <3 FB Neck ROM: limited    Dental no notable dental hx. (+) Poor Dentition, Chipped   Pulmonary shortness of breath and with exertion, sleep apnea , pneumonia, resolved,    Pulmonary exam normal breath sounds clear to auscultation       Cardiovascular Exercise Tolerance: Poor hypertension, Pt. on medications (-) angina+ CAD and + DOE  Normal cardiovascular exam+ Valvular Problems/Murmurs AS  Rhythm:regular Rate:Normal     Neuro/Psych PSYCHIATRIC DISORDERS Depression Peripheral neuropathy  Neuromuscular disease    GI/Hepatic negative GI ROS, Neg liver ROS, hiatal hernia, GERD  Medicated,  Endo/Other  negative endocrine ROSdiabetes, Well Controlled, Type 2, Oral Hypoglycemic AgentsHypothyroidism   Renal/GU Renal InsufficiencyRenal disease     Musculoskeletal  (+) Arthritis , Osteoarthritis,  Fibromyalgia -  Abdominal   Peds  Hematology negative hematology ROS (+)   Anesthesia Other Findings Signs and symptoms suggestive of sleep apnea   Past Medical History: No date: Arthritis No date: Depression No date: Diabetes mellitus without complication (HCC) No date: Fever due to malaria     Comment: 75 years old No date: Fibromyalgia No date: GERD (gastroesophageal reflux disease) No date: History of hiatal hernia No date: Hypertension No date: Hypothyroidism No date: Neuromuscular disorder (HCC)     Comment: Bells palsy left side of face No date: Neuropathy of both feet 1997: Pneumonia     Comment: history of walking pneumonia  Past Surgical History: No date: ABDOMINAL HYSTERECTOMY No date: APPENDECTOMY No date: BACK SURGERY 2002: BLADDER SUSPENSION No date: CHOLECYSTECTOMY No  date: TONSILLECTOMY  BMI    Body Mass Index:  45.36 kg/m      Reproductive/Obstetrics negative OB ROS                             Anesthesia Physical  Anesthesia Plan  ASA: III  Anesthesia Plan: General   Post-op Pain Management:    Induction:   Airway Management Planned: Oral ETT  Additional Equipment:   Intra-op Plan:   Post-operative Plan: Extubation in OR  Informed Consent: I have reviewed the patients History and Physical, chart, labs and discussed the procedure including the risks, benefits and alternatives for the proposed anesthesia with the patient or authorized representative who has indicated his/her understanding and acceptance.     Plan Discussed with: Anesthesiologist, CRNA and Surgeon  Anesthesia Plan Comments: (Patient informed that they are higher risk for complications from anesthesia during this procedure due to their medical history.  Patient voiced understanding. )        Anesthesia Quick Evaluation

## 2016-03-19 NOTE — Brief Op Note (Signed)
03/19/2016  1:39 PM  PATIENT:  Krystal ClarkGean I Carone  75 y.o. female  PRE-OPERATIVE DIAGNOSIS:  RIGHT PATELLA FRACTURE (failed fixation), Malunion of,LEFT ANKLE FRACTURE  POST-OPERATIVE DIAGNOSIS:  RIGHT PATELLA FRACTURE,LEFT ANKLE FRACTURE  PROCEDURE:  Procedure(s): OPEN REDUCTION INTERNAL (ORIF) FIXATION PATELLA (Right) OPEN REDUCTION INTERNAL FIXATION (ORIF) ANKLE FRACTURE (Left)  SURGEON:  Surgeon(s) and Role:    * Donato HeinzJames P Doniven Vanpatten, MD - Primary  ASSISTANTS: none   ANESTHESIA:   general  EBL:  Total I/O In: 1200 [I.V.:1200] Out: 460 [Urine:450; Blood:10]  BLOOD ADMINISTERED:none  DRAINS: none   LOCAL MEDICATIONS USED:  MARCAINE   (ankle)  SPECIMEN:  No Specimen  DISPOSITION OF SPECIMEN:  N/A  COUNTS:  YES  TOURNIQUET:   right lower extremity: 90 minutes left  lower extremity: 114 minutes  DICTATION: .Reubin Milanragon Dictation  PLAN OF CARE: Admit to inpatient   PATIENT DISPOSITION:  PACU - hemodynamically stable.   Delay start of Pharmacological VTE agent (>24hrs) due to surgical blood loss or risk of bleeding: yes

## 2016-03-19 NOTE — Anesthesia Procedure Notes (Signed)
Procedure Name: Intubation Date/Time: 03/19/2016 7:41 AM Performed by: Karoline CaldwellSTARR, Delta Pichon Pre-anesthesia Checklist: Patient identified, Emergency Drugs available, Suction available and Patient being monitored Patient Re-evaluated:Patient Re-evaluated prior to inductionOxygen Delivery Method: Circle system utilized Preoxygenation: Pre-oxygenation with 100% oxygen Intubation Type: IV induction Ventilation: Mask ventilation without difficulty Laryngoscope Size: Mac and 3 Grade View: Grade I Tube type: Oral Tube size: 7.0 mm Number of attempts: 1 Airway Equipment and Method: Stylet Placement Confirmation: ETT inserted through vocal cords under direct vision,  positive ETCO2 and breath sounds checked- equal and bilateral Secured at: 21 cm Tube secured with: Tape Dental Injury: Teeth and Oropharynx as per pre-operative assessment

## 2016-03-19 NOTE — Progress Notes (Signed)
Pharmacy Antibiotic Note  Leslie Weber is a 75 y.o. female admitted on 03/19/2016 with wound dehiscence s/p R TKA.  Pharmacy has been consulted for vancomycin dosing. Patient is also ordered rifampin.   Plan: Vancomycin 1000 mg IV q 24 hours with stacked dosing and a trough with the 4th dose. Goal trough 15-20 mcg/ml until r/o osteo.   Height: 5\' 2"  (157.5 cm) Weight: 218 lb (98.9 kg) IBW/kg (Calculated) : 50.1  Temp (24hrs), Avg:97.7 F (36.5 C), Min:97.6 F (36.4 C), Max:98 F (36.7 C)   Recent Labs Lab 03/16/16 0900 03/18/16 1448  WBC  --  8.6  CREATININE 1.31* 1.53*  VANCOTROUGH 26*  --     Estimated Creatinine Clearance: 36 mL/min (by C-G formula based on SCr of 1.53 mg/dL (H)).    Allergies  Allergen Reactions  . Oxycodone Other (See Comments)    "stopped breathing"    Antimicrobials this admission: cefazolin 12/29 x 1 perioperative vancomycin 12/29 >>  Rifampin 12/29 >>  Dose adjustments this admission:  Microbiology results:  Thank you for allowing pharmacy to be a part of this patient's care.  Valentina GuChristy, Jeyson Deshotel D 03/19/2016 3:43 PM

## 2016-03-19 NOTE — Transfer of Care (Signed)
Immediate Anesthesia Transfer of Care Note  Patient: Destenie I Kemnitz  Procedure(s) Performed: Procedure(s): OPEN REDUCTION INTERNAL (ORIF) FIXATION PATELLA (Right) OPEN REDUCTION INTERNAL FIXATION (ORIF) ANKLE FRACTURE (Left)  Patient Location: PACU  Anesthesia Type:General  Level of Consciousness: awake, alert  and oriented  Airway & Oxygen Therapy: Patient Spontanous Breathing and Patient connected to nasal cannula oxygen  Post-op Assessment: Report given to RN and Post -op Vital signs reviewed and stable  Post vital signs: Reviewed and stable  Last Vitals:  Vitals:   03/19/16 0639 03/19/16 1331  BP: (!) 127/52 (!) 110/52  Pulse: 90 98  Resp: 20 15  Temp: 36.7 C 36.4 C    Last Pain:  Vitals:   03/19/16 0639  TempSrc: Tympanic  PainSc: 2          Complications: No apparent anesthesia complications

## 2016-03-19 NOTE — Op Note (Signed)
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 M.D.  ASSISTANT:  None  ANESTHESIA: general  ESTIMATED BLOOD LOSS: 10 mL  FLUIDS REPLACED: 1200 mL of crystalloid  TOURNIQUET TIME: Right: 90 minutes left: 114 minutes  DRAINS: None  IMPLANTS UTILIZED: 2 - 1.6 mm K wires, 1.0 mm wire; Synthes 8 hole one third tubular plate and 8 - 3.5 mm cortical screws  INDICATIONS FOR SURGERY: Leslie Weber I Gumbs is a 75 y.o. year old female who fell and sustained a fracture of the right patella and a right trimalleolar ankle fracture. [facility] with attempted fixation of the patella fracture. The ankle fracture was treated with splinting. At the time of follow-up the patella fracture was noted to be grossly displaced. There was also a malunion of the lateral malleolus with disruption of the ankle mortise. After discussion of the risks and benefits of surgical intervention, the patient and her family expressed understanding of the risks benefits and agree with plans for surgical intervention.   The risks, benefits, and alternatives were discussed at length including but not limited to the risks of infection, bleeding, nerve injury, stiffness, blood clots, the need for revision surgery, cardiopulmonary complications, among others, and they were willing to proceed.  PROCEDURE IN DETAIL: The patient was brought into the operating room and, after adequate general anesthesia was achieved, a tourniquets were placed on the patient's upper thighs. The patient's right knee  and leg and left ankle were cleaned and prepped with alcohol and DuraPrep and draped in the usual sterile fashion. A "timeout" was performed as per usual protocol. The right lower extremity was exsanguinated using an Esmarch, and the tourniquet was inflated to 300 mmHg. An anterior longitudinal incision was made in line with the previous surgical incision and dissection was carried down to the retinaculum. A small hemarthrosis was evacuated and the joint was irrigated with copious amounts of normal saline with antibiotic solution. The fracture site was carefully debrided of hematoma and soft tissue. The previously placed sutures were noted to be ruptured and were removed Two 2.0 mm K wires were inserted in a parallel fashion through the proximal fragment. Next, a provisional reduction was performed and maintained using bone reduction forceps with points. Position and reduction was confirmed using C-arm. The K wires were then directed through the inferior patellar fragment. Again, reduction and position of K wires was confirmed using the C-arm. A 14-gauge Angiocath catheter was passed behind the K wires through the quadriceps tendon and a 1.6 mm wire was passed. The wire was then adjusted to form a figure-of-eight pattern and the Angiocath was used to pass the wire under the K wires along the inferior pole of the patella. The wire was tensioned and then twisted. Excellent compression at the fracture site was appreciated. The proximal portion of the K wires was then bent and impacted so as to engage the wire. The distal portion of the K wires were then bent slightly and cut distal to the tension band position was again confirmed with reduction verified using the C-arm in both AP and  lateral planes. Tourniquet was deflated after total tourniquet time of 90 minutes. Hemostasis was achieved using electrocautery. The retinaculum was repaired using #1 Ethibond. Subcutaneous tissue was approximated in layers using first #0  Monocryl followed by #2-0 Monocryl. Skin was closed with skin staples.  A sterile dressing was applied.  Attention was then directed to the left ankle. The left lower extremity was exsanguinated using an Esmarch and the tourniquet was inflated to 300 mmHg. A lateral longitudinal incision made in line with the distal fibula. Dissection was carried down to the fibula. Gross malunion was appreciated with abundant callus noted. The callus was meticulously debrided using combination of curettes and rongeurs. The fracture fragments were thus mobilized and a provisional reduction was performed using bone reduction forceps. Good position was noted and good restoration of the ankle mortise appreciated using the C-arm. An 8 hole one third tubular plate was contoured to match the lateral aspect of the distal fibula. The plate was then secured using a total of 83.5 mm cortical screws. Good maintenance of the reduction and restoration of the ankle mortise was appreciated using the C-arm. The wound was irrigated with copious amounts of normal saline with MRI solution. The wound was closed in layers using first #0 Vicryl followed #2-0 Vicryl. Skin was closed with skin staples. 0.25% Marcaine was injected along the incision site. A sterile dressing was applied followed by application of a posterior splint. The left tourniquet was deflated after total tourniquet time 114 minutes.  A Polar Care was applied to the right knee followed by application of a knee range of motion brace which was locked in extension.  The patient tolerated the procedure well and was transported to the recovery room in stable condition.  James P. Angie FavaHooten, Jr. M.D.

## 2016-03-19 NOTE — Progress Notes (Signed)
On initial assessment, pt refused to have skin assessment done because "im not rolling over right now" and "it hurts". Will reattempt later after pain medication received.

## 2016-03-20 LAB — GLUCOSE, CAPILLARY
GLUCOSE-CAPILLARY: 109 mg/dL — AB (ref 65–99)
GLUCOSE-CAPILLARY: 155 mg/dL — AB (ref 65–99)
Glucose-Capillary: 91 mg/dL (ref 65–99)

## 2016-03-20 LAB — BASIC METABOLIC PANEL
ANION GAP: 4 — AB (ref 5–15)
BUN: 21 mg/dL — AB (ref 6–20)
CO2: 29 mmol/L (ref 22–32)
Calcium: 8.6 mg/dL — ABNORMAL LOW (ref 8.9–10.3)
Chloride: 106 mmol/L (ref 101–111)
Creatinine, Ser: 1.32 mg/dL — ABNORMAL HIGH (ref 0.44–1.00)
GFR calc Af Amer: 45 mL/min — ABNORMAL LOW (ref >60)
GFR, EST NON AFRICAN AMERICAN: 39 mL/min — AB (ref >60)
GLUCOSE: 103 mg/dL — AB (ref 65–99)
POTASSIUM: 3.6 mmol/L (ref 3.5–5.1)
SODIUM: 139 mmol/L (ref 135–145)

## 2016-03-20 LAB — CBC
HCT: 25 % — ABNORMAL LOW (ref 35.0–47.0)
Hemoglobin: 8.2 g/dL — ABNORMAL LOW (ref 12.0–16.0)
MCH: 29.7 pg (ref 26.0–34.0)
MCHC: 32.7 g/dL (ref 32.0–36.0)
MCV: 90.9 fL (ref 80.0–100.0)
PLATELETS: 141 10*3/uL — AB (ref 150–440)
RBC: 2.75 MIL/uL — AB (ref 3.80–5.20)
RDW: 15.9 % — AB (ref 11.5–14.5)
WBC: 7.9 10*3/uL (ref 3.6–11.0)

## 2016-03-20 NOTE — NC FL2 (Signed)
[place] MEDICAID FL2 LEVEL OF CARE SCREENING TOOL     IDENTIFICATION  Patient Name: Leslie Weber Birthdate: [birth year] Sex: female Admission Date (Current Location): 03/19/2016  [place]   Facility and Address:  [facility], [address], [place], [place] [address]      Provider Number: [id number]  Attending Physician Name and Address:  Donato HeinzJames P Soriyah Osberg, MD  Relative Name and Phone Number:       Current Level of Care: Hospital Recommended Level of Care: Skilled Nursing Facility Prior Approval Number:    Date Approved/Denied: 01/12/16 PASRR Number: [id number] [id number]  Discharge Plan: SNF    Current Diagnoses: Patient Active Problem List   Diagnosis Date Noted  . Patellar fracture 03/19/2016  . Wound dehiscence 02/17/2016  . Altered mental status 01/15/2016  . S/P total knee arthroplasty 01/12/2016  . Pulmonary arterial hypertension 11/04/2015  . 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 Obesity 05/03/2014  . Peripheral neuropathy (HCC) 05/03/2014  . Spinal stenosis 05/03/2014  . Hypothyroidism 05/03/2014  . Hypertension 05/03/2014  . Hyperlipidemia, unspecified 05/03/2014  . Coronary artery disease 05/03/2014  . DDD (degenerative disc disease), lumbar 01/08/2014    Orientation RESPIRATION BLADDER Height & Weight     Self, Time, Situation, Place  Normal Continent Weight: 218 lb (98.9 kg) Height:  5\' 2"  (157.5 cm)  BEHAVIORAL SYMPTOMS/MOOD NEUROLOGICAL BOWEL NUTRITION STATUS      Continent    AMBULATORY STATUS COMMUNICATION OF NEEDS Skin   Total Care Verbally Surgical wounds                       Personal Care Assistance Level of Assistance  Bathing, Dressing Bathing Assistance: Limited assistance   Dressing Assistance: Limited assistance     Functional Limitations Info             SPECIAL CARE FACTORS FREQUENCY  PT (By licensed PT)     PT Frequency: Up to 5X per day, 5X per  week              Contractures Contractures Info: Present    Additional Factors Info  Allergies   Allergies Info: Oxycodone           Current Medications (03/20/2016):  This is the current hospital active medication list Current Facility-Administered Medications  Medication Dose Route Frequency Provider Last Rate Last Dose  . acetaminophen (TYLENOL) tablet 650 mg  650 mg Oral Q6H PRN Donato HeinzJames P Adolfo Granieri, MD       Or  . acetaminophen (TYLENOL) suppository 650 mg  650 mg Rectal Q6H PRN Donato HeinzJames P Deontez Klinke, MD      . alum & mag hydroxide-simeth (MAALOX/MYLANTA) 200-200-20 MG/5ML suspension 30 mL  30 mL Oral Q4H PRN Donato HeinzJames P Briggett Tuccillo, MD      . atorvastatin (LIPITOR) tablet 40 mg  40 mg Oral BH-q7a Donato HeinzJames P Taite Baldassari, MD   40 mg at 03/20/16 [id number]  . bisacodyl (DULCOLAX) suppository 10 mg  10 mg Rectal Daily PRN Donato HeinzJames P Jahad Old, MD      . calcium-vitamin D (OSCAL WITH D) 500-200 MG-UNIT per tablet 1 tablet  1 tablet Oral q12n4p Donato HeinzJames P Joliene Salvador, MD   1 tablet at 03/20/16 1258  . celecoxib (CELEBREX) capsule 200 mg  200 mg Oral BID Donato HeinzJames P Julicia Krieger, MD   200 mg at 03/20/16 [id number]  . citalopram (CELEXA) tablet 20 mg  20 mg Oral BH-q7a Donato HeinzJames P Tania Steinhauser, MD  20 mg at 03/20/16 [id number]  . diphenhydrAMINE (BENADRYL) 12.5 MG/5ML elixir 12.5-25 mg  12.5-25 mg Oral Q4H PRN Donato HeinzJames P Raymund Manrique, MD      . enoxaparin (LOVENOX) injection 30 mg  30 mg Subcutaneous Q12H Donato HeinzJames P Alessa Mazur, MD   30 mg at 03/20/16 [id number]  . ferrous sulfate tablet 325 mg  325 mg Oral BID WC Donato HeinzJames P Edel Rivero, MD   325 mg at 03/20/16 [id number]  . furosemide (LASIX) tablet 40 mg  40 mg Oral Daily Donato HeinzJames P Biannca Scantlin, MD   40 mg at 03/20/16 [id number]  . gabapentin (NEURONTIN) capsule 600 mg  600 mg Oral TID Donato HeinzJames P Rayen Dafoe, MD   600 mg at 03/20/16 1605  . insulin aspart (novoLOG) injection 0-15 Units  0-15 Units Subcutaneous TID WC Donato HeinzJames P Charlize Hathaway, MD   3 Units at 03/20/16 1257  . levothyroxine (SYNTHROID, LEVOTHROID) tablet 75 mcg  75 mcg Oral QAC breakfast Donato HeinzJames P Armen Waring, MD   75  mcg at 03/20/16 [id number]  . magnesium hydroxide (MILK OF MAGNESIA) suspension 30 mL  30 mL Oral Daily PRN Donato HeinzJames P Aldahir Litaker, MD      . magnesium oxide (MAG-OX) tablet 400 mg  400 mg Oral TID Donato HeinzJames P Odarius Dines, MD   400 mg at 03/20/16 [id number]  . menthol-cetylpyridinium (CEPACOL) lozenge 3 mg  1 lozenge Oral PRN Donato HeinzJames P Salima Rumer, MD       Or  . phenol (CHLORASEPTIC) mouth spray 1 spray  1 spray Mouth/Throat PRN Donato HeinzJames P Anh Bigos, MD      . metoCLOPramide (REGLAN) tablet 10 mg  10 mg Oral TID AC & HS Donato HeinzJames P Almin Livingstone, MD   10 mg at 03/20/16 1258  . multivitamin with minerals tablet 1 tablet  1 tablet Oral BH-q7a Donato HeinzJames P Lavana Huckeba, MD   1 tablet at 03/20/16 [id number]  . nortriptyline (PAMELOR) capsule 25 mg  25 mg Oral QHS Donato HeinzJames P Shenea Giacobbe, MD   25 mg at 03/19/16 2159  . ondansetron (ZOFRAN) tablet 4 mg  4 mg Oral Q6H PRN Donato HeinzJames P Teion Ballin, MD       Or  . ondansetron (ZOFRAN) injection 4 mg  4 mg Intravenous Q6H PRN Donato HeinzJames P Raniah Karan, MD      . pantoprazole (PROTONIX) EC tablet 40 mg  40 mg Oral BID Donato HeinzJames P Letesha Klecker, MD   40 mg at 03/20/16 [id number]  . potassium chloride (K-DUR,KLOR-CON) CR tablet 15 mEq  15 mEq Oral BID Donato HeinzJames P Conal Shetley, MD   15 mEq at 03/20/16 [id number]  . rifampin (RIFADIN) 300 mg in sodium chloride 0.9 % 100 mL IVPB  300 mg Intravenous Q12H Donato HeinzJames P Isabella Roemmich, MD   300 mg at 03/20/16 1203  . senna-docusate (Senokot-S) tablet 1 tablet  1 tablet Oral BID Donato HeinzJames P Anthonio Mizzell, MD   1 tablet at 03/20/16 [phone]  . sodium phosphate (FLEET) 7-19 GM/118ML enema 1 enema  1 enema Rectal Once PRN Donato HeinzJames P Koralee Wedeking, MD      . traMADol Janean Sark(ULTRAM) tablet 50-100 mg  50-100 mg Oral Q4H PRN Donato HeinzJames P Chanah Tidmore, MD   50 mg at 03/20/16 1605  . vancomycin (VANCOCIN) IVPB 1000 mg/200 mL premix  1,000 mg Intravenous Q24H Valentina GuScott D Christy, RPH   1,000 mg at 03/20/16 [id number]     Discharge Medications: Please see discharge summary for a list of discharge medications.  Relevant Imaging Results:  Relevant Lab Results:   Additional Information SS# [id number]  Judi CongKaren M  White, LCSW

## 2016-03-20 NOTE — Progress Notes (Signed)
Pt alert and oriented this shift. Drowsiness in AM subsided. Became anxios in late afternoon, per family pt tends to "sundown" in the evenings and not want to be alone in room. Family at bedside. Will continue to monitor.

## 2016-03-20 NOTE — Progress Notes (Signed)
Physical Therapy Evaluation Patient Details Name: Leslie Weber I Lada MRN: [id number] DOB: 1940-12-02 Today's Date: 03/20/2016   History of Present Illness  Patient is a 75 y.o. female s/p R TKA on 24 OCT. Admitted on 29 DEC after falling when rising from toilet, sustaining R patellar fx and L trimalleolar ankle fx. [facility] attempted fixation of patella and splinted ankle. Dr. 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 ORIF of both R patella and L ankle performed on 29 DEC.  Clinical Impression  Patient is a pleasant female admitted for ORIF of R patella fx and L trimalleolar ankle fx with above history. On evaluation, patient is NWB bilaterally, so bed mobility assessed and therapeutic exercises performed. Patient was previously modified independent with RW but will now need to be trained in wheelchair mobility until weightbearing restrictions are lifted. Patient will benefit from continued skilled PT upon discharge to allow for improvements in balance and strength deficits to prevent falls with complications in the future. She prefers to return to [facility].    Follow Up Recommendations SNF    Equipment Recommendations  None recommended by PT    Recommendations for Other Services       Precautions / Restrictions Precautions Precautions: Fall Required Braces or Orthoses: Knee Immobilizer - Right;Other Brace/Splint (L ankle splint) Knee Immobilizer - Right: On at all times Other Brace/Splint: On at all times Restrictions Weight Bearing Restrictions: Yes RLE Weight Bearing: Non weight bearing LLE Weight Bearing: Non weight bearing      Mobility  Bed Mobility Overal bed mobility: Needs Assistance Bed Mobility: Rolling Rolling: Mod assist         General bed mobility comments: Patient requires moderate assistance to perform bed mobility due to WB restrictions/immobilizations.  Transfers                    Ambulation/Gait                 Stairs            Wheelchair Mobility    Modified Rankin (Stroke Patients Only)       Balance Overall balance assessment: History of Falls                                           Pertinent Vitals/Pain Pain Assessment: No/denies pain    Home Living Family/patient expects to be discharged to:: Skilled nursing facility                 Additional Comments: [facility]    Prior Function Level of Independence: Independent with assistive device(s)               Hand Dominance        Extremity/Trunk Assessment   Upper Extremity Assessment Upper Extremity Assessment: Generalized weakness    Lower Extremity Assessment Lower Extremity Assessment: RLE deficits/detail;LLE deficits/detail RLE Deficits / Details: R knee wrapped and in immobilizer; hip strength 3- to 3/5, ankle strength 4/5 RLE: Unable to fully assess due to immobilization LLE Deficits / Details: L ankle in splint; hip/knee strength 3+/5 LLE: Unable to fully assess due to immobilization       Communication   Communication: No difficulties  Cognition Arousal/Alertness: Lethargic Behavior During Therapy: WFL for tasks assessed/performed Overall Cognitive Status: Within Functional Limits for tasks assessed  General Comments: Generally lethargic    General Comments      Exercises Total Joint Exercises Ankle Circles/Pumps: AROM;Right;10 reps Quad Sets: Strengthening;Both;10 reps Gluteal Sets: Strengthening;Both;10 reps Hip ABduction/ADduction: AAROM;Strengthening;Both;10 reps Straight Leg Raises: AAROM;Strengthening;Both;10 reps   Assessment/Plan    PT Assessment Patient needs continued PT services  PT Problem List Decreased strength;Decreased range of motion;Decreased activity tolerance;Decreased balance;Decreased mobility;Decreased knowledge of use of DME;Decreased safety awareness          PT Treatment Interventions DME  instruction;Gait training;Stair training;Functional mobility training;Therapeutic activities;Therapeutic exercise;Balance training;Patient/family education;Wheelchair mobility training    PT Goals (Current goals can be found in the Care Plan section)  Acute Rehab PT Goals Patient Stated Goal: "To go back to [facility]" PT Goal Formulation: With patient Time For Goal Achievement: 04/03/16 Potential to Achieve Goals: Good    Frequency BID   Barriers to discharge Decreased caregiver support;Inaccessible home environment      Co-evaluation               End of Session Equipment Utilized During Treatment: Right knee immobilizer;Oxygen Activity Tolerance: Patient limited by lethargy Patient left: in bed;with call bell/phone within reach;with bed alarm set;with SCD's reapplied           Time: 0900-0921 PT Time Calculation (min) (ACUTE ONLY): 21 min   Charges:   PT Evaluation $PT Eval Low Complexity: 1 Procedure PT Treatments $Therapeutic Exercise: 8-22 mins   PT G Codes:        Neita CarpJulie Ann Tempest [place], PT, DPT 03/20/2016, 10:11 AM

## 2016-03-20 NOTE — Progress Notes (Signed)
Subjective: 1 Day Post-Op Procedure(s) (LRB): OPEN REDUCTION INTERNAL (ORIF) FIXATION PATELLA (Right) OPEN REDUCTION INTERNAL FIXATION (ORIF) ANKLE FRACTURE (Left) Patient reports pain as mild.   Patient is well, and has had no acute complaints or problems Plan is to go Skilled nursing facility after hospital stay, [facility], plan will be to return there. Negative for chest pain and shortness of breath Fever: no Gastrointestinal:Negative for nausea and vomiting Pt currently on 2L of O2, this is her baseline home O2 therapy as well.  Objective: Vital signs in last 24 hours: Temp:  [97.5 F (36.4 C)-97.6 F (36.4 C)] 97.5 F (36.4 C) (12/29 1732) Pulse Rate:  [77-104] 77 (12/30 0400) Resp:  [15-23] 17 (12/29 1443) BP: (105-135)/(43-114) 112/51 (12/30 0400) SpO2:  [91 %-99 %] 99 % (12/30 0400) FiO2 (%):  [28 %] 28 % (12/29 1511)  Intake/Output from previous day:  Intake/Output Summary (Last 24 hours) at 03/20/16 0743 Last data filed at 03/20/16 0424  Gross per 24 hour  Intake             3195 ml  Output             2050 ml  Net             1145 ml    Intake/Output this shift: No intake/output data recorded.  Labs:  Recent Labs  03/18/16 1448 03/20/16 0445  HGB 10.2* 8.2*    Recent Labs  03/18/16 1448 03/20/16 0445  WBC 8.6 7.9  RBC 3.42* 2.75*  HCT 31.1* 25.0*  PLT 110* 141*    Recent Labs  03/18/16 1448 03/20/16 0445  NA 137 139  K 4.6 3.6  CL 101 106  CO2 26 29  BUN 22* 21*  CREATININE 1.53* 1.32*  GLUCOSE 100* 103*  CALCIUM 10.5* 8.6*    Recent Labs  03/18/16 1448  INR 1.00     EXAM General - Patient is Alert, Appropriate and drowsy but arousable and able to answer questions appropriately Extremity - ABD soft Sensation intact distally Intact pulses distally Incision: dressing C/D/I Dressing/Incision - clean, dry, no drainage Motor Function - intact, moving foot and toes well on exam.  Past Medical History:   Diagnosis Date  . Arthritis   . Coronary artery disease   . Depression   . Diabetes mellitus without complication (HCC)   . Fever due to malaria    75 years old  . Fibromyalgia   . GERD (gastroesophageal reflux disease)   . History of hiatal hernia   . Hypertension   . Hypothyroidism   . Neuromuscular disorder (HCC)    Bells palsy left side of face  . Neuropathy of both feet   . Pneumonia 1997   history of walking pneumonia  . Sleep apnea    not yet had sleep study   Assessment/Plan: 1 Day Post-Op Procedure(s) (LRB): OPEN REDUCTION INTERNAL (ORIF) FIXATION PATELLA (Right) OPEN REDUCTION INTERNAL FIXATION (ORIF) ANKLE FRACTURE (Left) Active Problems:   Patellar fracture  Estimated body mass index is 39.87 kg/m as calculated from the following:   Height as of this encounter: 5\' 2"  (1.575 m).   Weight as of this encounter: 98.9 kg (218 lb). Advance diet Up with therapy  D/C IVF when tolerating po intake.  Labs reviewed, acute blood loss anemia, Hg stable at 8.2 this AM. AKI, Cr and BUN improved this morning (132 Cr and 21 BUN) Continue Tramadol and Celebrex, pt [facility] to BP dropping. Continue with IV Abx while hospitalized. PT and OT to treat today. Plan will be for discharge to SNF when medically appropriate, likely tomorrow for discharge. CBC and BMP ordered for tomorrow morning.  DVT Prophylaxis - Lovenox, Foot Pumps and TED hose Non-weightbearing to bilateral lower extremities.  Valeria BatmanJ. Lance Baraa Tubbs, PA-C [facility] 03/20/2016, 7:43 AM

## 2016-03-20 NOTE — Progress Notes (Signed)
Patient denies pain this am.

## 2016-03-20 NOTE — Progress Notes (Signed)
Pt drowsy but arouseable. Denies pain. Oriented x 4. Will continue to monitor.

## 2016-03-20 NOTE — Progress Notes (Signed)
Physical Therapy Treatment Patient Details Name: Leslie Weber MRN: [id number] DOB: April 28, 1940 Today's Date: 03/20/2016    History of Present Illness Patient is a 75 y.o. female s/p R TKA on 24 OCT. Admitted on 29 DEC after falling when rising from toilet, sustaining R patellar fx and L trimalleolar ankle fx. [facility] attempted fixation of patella and splinted ankle. Dr. 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 ORIF of both R patella and L ankle performed on 29 DEC.    PT Comments    Patient with improved energy levels at this afternoon's PT session. Demonstrates good motivation during performance of exercises and bed mobility training. Sitting tolerance 75 seconds at EOB until fatigued. Patient will benefit from continued functional strengthening until WB restrictions are lifted.  Follow Up Recommendations  SNF     Equipment Recommendations  None recommended by PT    Recommendations for Other Services       Precautions / Restrictions Precautions Precautions: Fall Required Braces or Orthoses: Knee Immobilizer - Right;Other Brace/Splint Knee Immobilizer - Right: On at all times Other Brace/Splint: On at all times Restrictions Weight Bearing Restrictions: Yes RLE Weight Bearing: Non weight bearing LLE Weight Bearing: Non weight bearing    Mobility  Bed Mobility Overal bed mobility: Needs Assistance Bed Mobility: Supine to Sit;Sit to Supine     Supine to sit: Mod assist Sit to supine: Max assist   General bed mobility comments: Patient requires moderate assistance to move to EOB and moderate-maximal assistance to return to supine. Utilizes overhead trapeze to lift body to [employer].  Transfers                    Ambulation/Gait                 Stairs            Wheelchair Mobility    Modified Rankin (Stroke Patients Only)       Balance Overall balance assessment: History of Falls;Needs  assistance Sitting-balance support: Feet unsupported;Bilateral upper extremity supported Sitting balance-Leahy Scale: Fair Sitting balance - Comments: Patient able to sit at EOB 75 seconds                            Cognition Arousal/Alertness: Awake/alert Behavior During Therapy: WFL for tasks assessed/performed Overall Cognitive Status: Within Functional Limits for tasks assessed                      Exercises Total Joint Exercises Ankle Circles/Pumps: AROM;Right;10 reps Quad Sets: Strengthening;Both;10 reps Gluteal Sets: Strengthening;Both;10 reps Short Arc Quad: Strengthening;Left;15 reps Heel Slides: Strengthening;Left;15 reps Hip ABduction/ADduction: Strengthening;Both;10 reps Straight Leg Raises: Strengthening;AAROM;Both;10 reps Long Arc Quad: Strengthening;Left;10 reps    General Comments        Pertinent Vitals/Pain Pain Assessment: No/denies pain    Home Living                      Prior Function            PT Goals (current goals can now be found in the care plan section) Acute Rehab PT Goals Patient Stated Goal: "To go back to [facility]" PT Goal Formulation: With patient Time For Goal Achievement: 04/03/16 Potential to Achieve Goals: Good Progress towards PT goals: Progressing toward goals    Frequency    BID      PT Plan  Current plan remains appropriate    Co-evaluation             End of Session Equipment Utilized During Treatment: Oxygen;Right knee immobilizer Activity Tolerance: Patient tolerated treatment well Patient left: in bed;with call bell/phone within reach;with bed alarm set;with family/visitor present;with SCD's reapplied     Time: 1330-1403 PT Time Calculation (min) (ACUTE ONLY): 33 min  Charges:  $Therapeutic Exercise: 8-22 mins $Therapeutic Activity: 8-22 mins                    G Codes:      Neita CarpJulie Ann Serafin [place], PT, DPT 03/20/2016, 2:32 PM

## 2016-03-20 NOTE — Clinical Social Work Note (Signed)
Clinical Social Work Assessment  Patient Details  Name: Leslie Weber I Landstrom MRN: [id number] Date of Birth: 12/20/1940  Date of referral:  03/20/16               Reason for consult:  Facility Placement                Permission sought to share information with:  Facility Industrial/product designerContact Representative Permission granted to share information::  Yes, Verbal Permission Granted  Name::        Agency::     Relationship::     Contact Information:     Housing/Transportation Living arrangements for the past 2 months:  Skilled Nursing Facility Source of Information:  Patient, Adult Children Patient Interpreter Needed:  None Criminal Activity/Legal Involvement Pertinent to Current Situation/Hospitalization:  No - Comment as needed Significant Relationships:  Adult Children, Community Support Lives with:  Self Do you feel safe going back to the place where you live?  Yes Need for family participation in patient care:  No (Coment)  Care giving concerns:  STR   Social Worker assessment / plan:  CSW visited patient and her family at bedside to discuss dc planning. The patient is here from [facility] and would like to return at dc. [facility] has confirmed that the patient can return.  The patient's daughter indicated that she would like bed rails on the bed at [facility]. CSW indicated that she would give that message to the facility.  Employment status:  Retired Database administratornsurance information:  Managed Medicare PT Recommendations:  Skilled Nursing Facility Information / Referral to community resources:  Skilled Nursing Facility  Patient/Family's Response to care:  Patient and family thanked the CSW.  Patient/Family's Understanding of and Emotional Response to Diagnosis, Current Treatment, and Prognosis:  Patient and family in agreement with plan.  Emotional Assessment Appearance:  Appears stated age Attitude/Demeanor/Rapport:   (Very Pleasant) Affect (typically observed):  Accepting, Appropriate, Pleasant Orientation:   Oriented to Self, Oriented to Place, Oriented to  Time, Oriented to Situation Alcohol / Substance use:  Never Used Psych involvement (Current and /or in the community):  No (Comment)  Discharge Needs  Concerns to be addressed:  Discharge Planning Concerns Readmission within the last 30 days:  No Current discharge risk:  None Barriers to Discharge:  Continued Medical Work up   [employer] CorporationKaren M Samie Barclift, LCSW 03/20/2016, 5:02 PM

## 2016-03-20 NOTE — Progress Notes (Signed)
Patient drowsy but easily arousable.  Pain is well controlled with current medication.  Foley removed and patient had an incontinent bowel movement this am.  No signs of distress and appears to have slept well.

## 2016-03-21 LAB — CBC
HCT: 24.4 % — ABNORMAL LOW (ref 35.0–47.0)
Hemoglobin: 8.1 g/dL — ABNORMAL LOW (ref 12.0–16.0)
MCH: 29.9 pg (ref 26.0–34.0)
MCHC: 33.4 g/dL (ref 32.0–36.0)
MCV: 89.6 fL (ref 80.0–100.0)
PLATELETS: 173 10*3/uL (ref 150–440)
RBC: 2.72 MIL/uL — ABNORMAL LOW (ref 3.80–5.20)
RDW: 16.3 % — AB (ref 11.5–14.5)
WBC: 8.9 10*3/uL (ref 3.6–11.0)

## 2016-03-21 LAB — BASIC METABOLIC PANEL
ANION GAP: 3 — AB (ref 5–15)
BUN: 17 mg/dL (ref 6–20)
CALCIUM: 9.1 mg/dL (ref 8.9–10.3)
CO2: 30 mmol/L (ref 22–32)
CREATININE: 1.36 mg/dL — AB (ref 0.44–1.00)
Chloride: 104 mmol/L (ref 101–111)
GFR calc Af Amer: 44 mL/min — ABNORMAL LOW (ref >60)
GFR, EST NON AFRICAN AMERICAN: 38 mL/min — AB (ref >60)
Glucose, Bld: 106 mg/dL — ABNORMAL HIGH (ref 65–99)
Potassium: 3.9 mmol/L (ref 3.5–5.1)
Sodium: 137 mmol/L (ref 135–145)

## 2016-03-21 LAB — GLUCOSE, CAPILLARY
GLUCOSE-CAPILLARY: 121 mg/dL — AB (ref 65–99)
GLUCOSE-CAPILLARY: 125 mg/dL — AB (ref 65–99)
Glucose-Capillary: 110 mg/dL — ABNORMAL HIGH (ref 65–99)
Glucose-Capillary: 133 mg/dL — ABNORMAL HIGH (ref 65–99)

## 2016-03-21 MED ORDER — ENOXAPARIN SODIUM 40 MG/0.4ML ~~LOC~~ SOLN: 40.0000 mg | SUBCUTANEOUS | 0 refills | Status: DC

## 2016-03-21 NOTE — Discharge Summary (Addendum)
Physician Discharge Summary  Patient ID: Leslie Weber Seidman MRN: [id number] DOB/AGE: [birth year] 75 y.o.  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 Patellar fracture 03/19/2016  . Wound dehiscence 02/17/2016  . Altered mental status 01/15/2016  . S/P total knee arthroplasty 01/12/2016  . Pulmonary arterial hypertension 11/04/2015  . 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 Obesity 05/03/2014  . Peripheral neuropathy (HCC) 05/03/2014  . Spinal stenosis 05/03/2014  . Hypothyroidism 05/03/2014  . Hypertension 05/03/2014  . Hyperlipidemia, unspecified 05/03/2014  . Coronary artery disease 05/03/2014  . 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 Arthritis   . Coronary artery disease   . Depression   . Diabetes mellitus without complication (HCC)   . Fever due to malaria    75 years old  . Fibromyalgia   . GERD (gastroesophageal reflux disease)   . History of hiatal hernia   . Hypertension   . Hypothyroidism   . Neuromuscular disorder (HCC)    Bells palsy left side of face  . Neuropathy of both feet   . Pneumonia 1997   history of walking pneumonia  . Sleep apnea    not yet had sleep study     Transfusion: None   Consultants (if any): Treatment Team:  Mick Sellavid P Fitzgerald, MD  Discharged Condition: Improved  Hospital Course: Leslie Weber Perfect is an 75 y.o. female who was admitted 03/19/2016 with a diagnosis of fracture of the right patella (failed previous fixation) and malunion of a left trimalleolar ankle fracture and went to the operating room on 03/19/2016  and underwent the above named procedures.    Surgeries: Procedure(s): OPEN REDUCTION INTERNAL (ORIF) FIXATION PATELLA OPEN REDUCTION INTERNAL FIXATION (ORIF) ANKLE FRACTURE on 03/19/2016 Patient tolerated the surgery well. Taken to PACU where she was stabilized and then transferred to the orthopedic floor.  Started on Lovenox 30mg  q 12 hrs. Foot pumps applied bilaterally at 80 mm. Heels elevated on bed with rolled towels. No evidence of DVT. Negative Homan. Physical therapy started on day #1 for gait training and transfer. OT started day #1 for ADL and assisted devices.  Patient's IV and Foley were d/c on POD1.  Implants: 2 - 1.6 mm K wires, 1.0 mm wire; Synthes 8 hole one third tubular plate and 8 - 3.5 mm cortical screws  She was given perioperative antibiotics:  Anti-infectives    Start     Dose/Rate Route Frequency Ordered Stop   03/23/16 0000  vancomycin (VANCOCIN) 1-5 GM/200ML-% SOLN     1,000 mg 200 mL/hr over 60 Minutes Intravenous Every 24 hours 03/22/16 1251 03/30/16 2359   03/22/16 0000  rifampin 300 mg in sodium chloride 0.9 % 100 mL     300 mg 200 mL/hr over 30 Minutes Intravenous Every 12 hours 03/22/16 1251 03/30/16 2359   03/20/16 0000  vancomycin (VANCOCIN) IVPB 1000 mg/200 mL premix     1,000 mg 200 mL/hr over 60 Minutes Intravenous Every 24 hours 03/19/16 1542     03/19/16 1545  vancomycin (VANCOCIN) IVPB 1000 mg/200 mL premix     1,000 mg 200 mL/hr over 60 Minutes Intravenous STAT 03/19/16 1542 03/19/16 1751  03/19/16 1515  rifampin (RIFADIN) 300 mg in sodium chloride 0.9 % 100 mL IVPB     300 mg 200 mL/hr over 30 Minutes Intravenous Every 12 hours 03/19/16 1510     03/19/16 0600  ceFAZolin (ANCEF) IVPB 2g/100 mL premix     2 g 200 mL/hr over 30 Minutes Intravenous On call to O.R. 03/19/16 0239 03/19/16 0811   03/19/16 0554  ceFAZolin (ANCEF) 2-4 GM/100ML-% IVPB    Comments:  Letta Pate: cabinet override      03/19/16 0554 03/19/16 0756    .  She was  given sequential compression devices, early ambulation, and Lovenox for DVT prophylaxis.  She benefited maximally from the hospital stay and there were no complications.    Recent vital signs:  Vitals:   03/22/16 0613 03/22/16 0759  BP: (!) 146/72 (!) 161/66  Pulse: 91 90  Resp: 18   Temp: 98.4 F (36.9 C) 98.4 F (36.9 C)    Recent laboratory studies:  Lab Results  Component Value Date   HGB 8.5 (L) 03/22/2016   HGB 8.1 (L) 03/21/2016   HGB 8.2 (L) 03/20/2016   Lab Results  Component Value Date   WBC 9.7 03/22/2016   PLT 235 03/22/2016   Lab Results  Component Value Date   INR 1.00 03/18/2016   Lab Results  Component Value Date   NA 138 03/22/2016   K 4.1 03/22/2016   CL 104 03/22/2016   CO2 28 03/22/2016   BUN 18 03/22/2016   CREATININE 1.08 (H) 03/22/2016   GLUCOSE 104 (H) 03/22/2016    Discharge Medications:   Allergies as of 03/22/2016      Reactions   Oxycodone Other (See Comments)   "stopped breathing"      Medication List    TAKE these medications   acetaminophen 500 MG tablet Commonly known as:  TYLENOL Take 500 mg by mouth every 6 (six) hours.   atorvastatin 40 MG tablet Commonly known as:  LIPITOR Take 40 mg by mouth every morning.   CALCIUM 600+D 600-800 MG-UNIT Tabs Generic drug:  Calcium Carb-Cholecalciferol Take 1 Dose by mouth 2 times daily at 12 noon and 4 pm.   celecoxib 200 MG capsule Commonly known as:  CELEBREX Take 200 mg by mouth 2 (two) times daily.   CENTRUM WOMEN PO Take 1 tablet by mouth every morning.   ciprofloxacin 500 MG tablet Commonly known as:  CIPRO Take 1 tablet (500 mg total) by mouth 2 (two) times daily.   citalopram 20 MG tablet Commonly known as:  CELEXA Take 20 mg by mouth every morning.   enoxaparin 40 MG/0.4ML injection Commonly known as:  LOVENOX Inject 0.4 mLs (40 mg total) into the skin daily.   Ferrous Sulfate Dried 45 MG Tbcr Take 1 tablet by mouth every morning.   gabapentin 300 MG  capsule Commonly known as:  NEURONTIN Take 300 mg by mouth 3 (three) times daily. 2 Capsules 3 times a day.   levothyroxine 50 MCG tablet Commonly known as:  SYNTHROID, LEVOTHROID Take 50 mcg by mouth daily before breakfast.   nortriptyline 75 MG capsule Commonly known as:  PAMELOR Take 75 mg by mouth at bedtime.   pantoprazole 40 MG tablet Commonly known as:  PROTONIX Take 40 mg by mouth daily before breakfast.   rifampin 300 mg in sodium chloride 0.9 % 100 mL Inject 300 mg into the vein every 12 (twelve) hours.   traMADol 50 MG tablet Commonly known as:  Janean Sark  Take 1 tablet (50 mg total) by mouth every 6 (six) hours as needed for moderate pain.   vancomycin 1-5 GM/200ML-% Soln Commonly known as:  VANCOCIN Inject 200 mLs (1,000 mg total) into the vein daily. Start taking on:  03/23/2016   VICTOZA 18 MG/3ML Sopn Generic drug:  liraglutide Inject 0.6 mg into the skin every morning.       Diagnostic Studies: Dg Knee 1-2 Views Right  Result Date: 03/19/2016 CLINICAL DATA:  Postop open reduction and internal fixation of patellar fracture. EXAM: DG C-ARM GT 120 MIN; RIGHT KNEE - 1-2 VIEW; LEFT ANKLE - 2 VIEW FLUOROSCOPY TIME:  1 minute and 6 seconds. C-arm fluoroscopic images were obtained intraoperatively and submitted for post operative interpretation. Please see the performing provider's procedural report for the fluoroscopy time utilized. COMPARISON:  Knee radiographs 01/12/2016. No comparison studies of the ankle. FINDINGS: Three spot fluoroscopic images of the ankle (labeled left) demonstrate plate and screw fixation of the distal fibular diaphysis for an oblique fracture. There is near anatomic reduction. No abnormality of the tibia or widening of the ankle mortise identified. Single lateral spot fluoroscopic image of the right knee demonstrates pin and cerclage wire fixation of a transverse patellar fracture. There is near anatomic reduction. Patient is status post right knee  arthroplasty. IMPRESSION: Intraoperative views following distal left fibular and right patellar ORIF. No demonstrated complications. Electronically Signed   By: Carey BullocksWilliam  Veazey M.D.   On: 03/19/2016 13:09   Dg Ankle 2 Views Left  Result Date: 03/19/2016 CLINICAL DATA:  Postop open reduction and internal fixation of patellar fracture. EXAM: DG C-ARM GT 120 MIN; RIGHT KNEE - 1-2 VIEW; LEFT ANKLE - 2 VIEW FLUOROSCOPY TIME:  1 minute and 6 seconds. C-arm fluoroscopic images were obtained intraoperatively and submitted for post operative interpretation. Please see the performing provider's procedural report for the fluoroscopy time utilized. COMPARISON:  Knee radiographs 01/12/2016. No comparison studies of the ankle. FINDINGS: Three spot fluoroscopic images of the ankle (labeled left) demonstrate plate and screw fixation of the distal fibular diaphysis for an oblique fracture. There is near anatomic reduction. No abnormality of the tibia or widening of the ankle mortise identified. Single lateral spot fluoroscopic image of the right knee demonstrates pin and cerclage wire fixation of a transverse patellar fracture. There is near anatomic reduction. Patient is status post right knee arthroplasty. IMPRESSION: Intraoperative views following distal left fibular and right patellar ORIF. No demonstrated complications. Electronically Signed   By: Carey BullocksWilliam  Veazey M.D.   On: 03/19/2016 13:09   Dg C-arm Gt 120 Min  Result Date: 03/19/2016 CLINICAL DATA:  Postop open reduction and internal fixation of patellar fracture. EXAM: DG C-ARM GT 120 MIN; RIGHT KNEE - 1-2 VIEW; LEFT ANKLE - 2 VIEW FLUOROSCOPY TIME:  1 minute and 6 seconds. C-arm fluoroscopic images were obtained intraoperatively and submitted for post operative interpretation. Please see the performing provider's procedural report for the fluoroscopy time utilized. COMPARISON:  Knee radiographs 01/12/2016. No comparison studies of the ankle. FINDINGS: Three spot  fluoroscopic images of the ankle (labeled left) demonstrate plate and screw fixation of the distal fibular diaphysis for an oblique fracture. There is near anatomic reduction. No abnormality of the tibia or widening of the ankle mortise identified. Single lateral spot fluoroscopic image of the right knee demonstrates pin and cerclage wire fixation of a transverse patellar fracture. There is near anatomic reduction. Patient is status post right knee arthroplasty. IMPRESSION: Intraoperative views following distal left fibular and right patellar  ORIF. No demonstrated complications. Electronically Signed   By: Carey Bullocks M.D.   On: 03/19/2016 13:09   Disposition: 03-Skilled Nursing Facility, will need to remain non-weightbearing to bilateral lower extremities.  Maintain Full extension to the right knee at all times. Lovenox for DVT prophylaxis.   Contact information for follow-up providers    WOLFE,JON R., PA Follow up on 04/02/2016.   Specialty:  Physician Assistant Why:  at 3:45 pm Contact information: [address] [facility] [place] [address] [phone]        Donato Heinz, MD Follow up on 04/20/2016.   Specialty:  Orthopedic Surgery Why:  at 2:45pm Contact information: [address] [facility] [place] [address] [phone]            Contact information for after-discharge care    Destination    [facility] Follow up.   Specialty:  Skilled Nursing Facility Contact information: [address] [place] [address] [phone]                 Signed: Patience Musca PA-C 03/22/2016, 12:52 PM

## 2016-03-21 NOTE — Progress Notes (Signed)
OT Cancellation Note  Patient Details Name: Krystal ClarkGean I Stratmann MRN: [id number] DOB: Aug 10, 1940   Cancelled Treatment:       Oletta CohnuPreez, Vuk Skillern 03/21/2016, 1:15 PM   Pt had about 2 months ago a R TKR and was in rehab at [facility] - was educated in use of DME and AE, - and now present with R patella ORIF and L ankle - pt now NWB on bilateral LE  - pt feels like she knows how to use AE - and plan to go to [facility] again where they can address her transfers with sliding board , and ADL's. And PT is adressing her sitting at EOB - no OT services indicated at  This setting   Hartford FinancialMaureen du Preez OTR/L,CLT

## 2016-03-21 NOTE — Progress Notes (Signed)
Pt requests medication for anxiety at night. Will discuss with MD at rounds.

## 2016-03-21 NOTE — Progress Notes (Signed)
Subjective: 2 Days Post-Op Procedure(s) (LRB): OPEN REDUCTION INTERNAL (ORIF) FIXATION PATELLA (Right) OPEN REDUCTION INTERNAL FIXATION (ORIF) ANKLE FRACTURE (Left) Patient reports pain as mild, does complain of mild pain in the left heel.   Patient is well, and has had no acute complaints or problems Plan is to go Skilled nursing facility after hospital stay, [facility], plan will be to return there. Negative for chest pain and shortness of breath Fever: no Gastrointestinal:Negative for nausea and vomiting Pt currently on 2L of O2, this is her baseline home O2 therapy as well.  Objective: Vital signs in last 24 hours: Temp:  [97.9 F (36.6 C)-98.6 F (37 C)] 98.3 F (36.8 C) (12/31 0751) Pulse Rate:  [81-103] 96 (12/31 0751) Resp:  [18] 18 (12/31 0751) BP: (107-147)/(57-78) 124/61 (12/31 0751) SpO2:  [100 %] 100 % (12/31 0751)  Intake/Output from previous day:  Intake/Output Summary (Last 24 hours) at 03/21/16 0832 Last data filed at 03/20/16 2200  Gross per 24 hour  Intake             1240 ml  Output                0 ml  Net             1240 ml    Intake/Output this shift: No intake/output data recorded.  Labs:  Recent Labs  03/18/16 1448 03/20/16 0445 03/21/16 0433  HGB 10.2* 8.2* 8.1*    Recent Labs  03/20/16 0445 03/21/16 0433  WBC 7.9 8.9  RBC 2.75* 2.72*  HCT 25.0* 24.4*  PLT 141* 173    Recent Labs  03/20/16 0445 03/21/16 0433  NA 139 137  K 3.6 3.9  CL 106 104  CO2 29 30  BUN 21* 17  CREATININE 1.32* 1.36*  GLUCOSE 103* 106*  CALCIUM 8.6* 9.1    Recent Labs  03/18/16 1448  INR 1.00     EXAM General - Patient is Alert, Appropriate and Oriented Extremity - ABD soft Intact pulses distally Incision: dressing C/D/I  Pt does complain of decrease in sensation in the peroneal distribution of the right leg, able to flex and extend ankle and toes without difficulty, states this decrease in sensation was ongoing  prior to surgery. Cap refill intact to each toe. Dressing/Incision - clean, dry, no drainage Motor Function - intact, moving foot and toes well on exam.  Past Medical History:  Diagnosis Date  . Arthritis   . Coronary artery disease   . Depression   . Diabetes mellitus without complication (HCC)   . Fever due to malaria    75 years old  . Fibromyalgia   . GERD (gastroesophageal reflux disease)   . History of hiatal hernia   . Hypertension   . Hypothyroidism   . Neuromuscular disorder (HCC)    Bells palsy left side of face  . Neuropathy of both feet   . Pneumonia 1997   history of walking pneumonia  . Sleep apnea    not yet had sleep study   Assessment/Plan: 2 Days Post-Op Procedure(s) (LRB): OPEN REDUCTION INTERNAL (ORIF) FIXATION PATELLA (Right) OPEN REDUCTION INTERNAL FIXATION (ORIF) ANKLE FRACTURE (Left) Active Problems:   Patellar fracture  Estimated body mass index is 39.87 kg/m as calculated from the following:   Height as of this encounter: 5\' 2"  (1.575 m).   Weight as of this encounter: 98.9 kg (218 lb). Up with therapy   Labs reviewed, acute blood loss anemia,  Hg stable at 8.1 this AM. AKI, BUN improved but Cr 1.36, encouraged increased oral intake. Continue Tramadol and Celebrex, pt [facility] due to BP dropping. Continue with IV Abx while hospitalized. PT and OT to treat today. Plan will be for discharge to SNF when medically appropriate, plan will be for discharge either later today or tomorrow. CBC and BMP ordered for tomorrow morning.  DVT Prophylaxis - Lovenox, Foot Pumps and TED hose Non-weightbearing to bilateral lower extremities.  Valeria BatmanJ. Lance Haja Crego, PA-C [facility] 03/21/2016, 8:32 AM

## 2016-03-21 NOTE — Progress Notes (Signed)
Physical Therapy Treatment Patient Details Name: Leslie Weber I Hanford MRN: [id number] DOB: [birth year] Today's Date: 03/21/2016    History of Present Illness Patient is a 75 y.o. female s/p R TKA on 24 OCT. Admitted on 29 DEC after falling when rising from toilet, sustaining R patellar fx and L trimalleolar ankle fx. [facility] attempted fixation of patella and splinted ankle. Dr. 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 ORIF of both R patella and L ankle performed on 29 DEC.    PT Comments    Patient demonstrates slight improvements in assistance required for bed mobility today and in endurance sitting at EOB. Patient educated about postural stabilizers in sitting to improve activity tolerance. Patient will continue to benefit from dynamic sitting balance and UE/LE progressions as tolerated to allow her to safely perform NWB transfers upon discharge.  Follow Up Recommendations  SNF     Equipment Recommendations  None recommended by PT    Recommendations for Other Services       Precautions / Restrictions Precautions Precautions: Fall Required Braces or Orthoses: Knee Immobilizer - Right;Other Brace/Splint Knee Immobilizer - Right: On at all times Other Brace/Splint: On at all times Restrictions Weight Bearing Restrictions: Yes RLE Weight Bearing: Non weight bearing LLE Weight Bearing: Non weight bearing    Mobility  Bed Mobility Overal bed mobility: Needs Assistance Bed Mobility: Supine to Sit;Sit to Supine Rolling: Mod assist   Supine to sit: Min assist Sit to supine: Mod assist   General bed mobility comments: Patient demonstrates improvements in bed mobility at today's session, responding well to verbal cues for sequencing.  Transfers                    Ambulation/Gait                 Stairs            Wheelchair Mobility    Modified Rankin (Stroke Patients Only)       Balance Overall balance  assessment: Needs assistance;History of Falls Sitting-balance support: Feet unsupported;No upper extremity supported Sitting balance-Leahy Scale: Fair Sitting balance - Comments: Patient able to sit at EOB 90 seconds                            Cognition Arousal/Alertness: Awake/alert Behavior During Therapy: WFL for tasks assessed/performed Overall Cognitive Status: Within Functional Limits for tasks assessed                      Exercises Total Joint Exercises Ankle Circles/Pumps: AROM;Right;15 reps Quad Sets: Strengthening;Both;15 reps Gluteal Sets: Strengthening;Both;15 reps Short Arc Quad: Strengthening;15 reps;Left Heel Slides: Strengthening;Left;15 reps Hip ABduction/ADduction: Strengthening;Both;15 reps Straight Leg Raises: AAROM;Strengthening;Both;15 reps Long Arc Quad: Strengthening;Left;10 reps    General Comments        Pertinent Vitals/Pain Pain Assessment: No/denies pain    Home Living                      Prior Function            PT Goals (current goals can now be found in the care plan section) Acute Rehab PT Goals Patient Stated Goal: "To go back to [facility]" PT Goal Formulation: With patient Time For Goal Achievement: 04/03/16 Potential to Achieve Goals: Good Progress towards PT goals: Progressing toward goals    Frequency    BID  PT Plan Current plan remains appropriate    Co-evaluation             End of Session Equipment Utilized During Treatment: Oxygen;Right knee immobilizer Activity Tolerance: Patient tolerated treatment well Patient left: in bed;with call bell/phone within reach;with bed alarm set;with SCD's reapplied     Time: 0812-0840 PT Time Calculation (min) (ACUTE ONLY): 28 min  Charges:  $Therapeutic Exercise: 8-22 mins $Therapeutic Activity: 8-22 mins                    G Codes:      Neita CarpJulie Ann Tashayla Therien, PT, DPT 03/21/2016, 11:28 AM

## 2016-03-21 NOTE — Discharge Instructions (Signed)
-  Non-weightbearing to bilateral lower extremities. -Lovenox for 14 days due to immobilization. -Keep left ankle splint dry, can change dressing to right knee as needed. -Call if fevers above 101 degrees F, follow-up with Van ClinesJon Wolfe, PA in two weeks.

## 2016-03-21 NOTE — Consult Note (Signed)
[facility] Infectious Disease     Reason for Consult: Prosthetic knee infection     Referring Physician: Belva Agee Date of Admission:  03/19/2016   Active Problems:   Patellar fracture   HPI: Leslie Weber is a 75 y.o. female admitted for repeat repair of R patella fx and L ankle repair. Complicated history with fall and dehiscence of post op wound and had relatively immediate polyethylene exchange and wash out. Has been on iv vanco and oral rifampin from [facility] admission. Doing well post op. Tolerating IV vanco and oral rifampin. Is at [facility]   Past Medical History:  Diagnosis Date  . Arthritis   . Coronary artery disease   . Depression   . Diabetes mellitus without complication ([place])   . Fever due to malaria    75 years old  . Fibromyalgia   . GERD (gastroesophageal reflux disease)   . History of hiatal hernia   . Hypertension   . Hypothyroidism   . Neuromuscular disorder ([place])    Bells palsy left side of face  . Neuropathy of both feet   . Pneumonia 1997   history of walking pneumonia  . Sleep apnea    not yet had sleep study   Past Surgical History:  Procedure Laterality Date  . ABDOMINAL HYSTERECTOMY    . APPENDECTOMY    . BACK SURGERY    . BLADDER SUSPENSION  2002  . CHOLECYSTECTOMY    . KNEE ARTHROPLASTY Right 01/12/2016   Procedure: COMPUTER ASSISTED TOTAL KNEE ARTHROPLASTY;  Surgeon: Dereck Leep, MD;  Location: [facility] ORS;  Service: Orthopedics;  Laterality: Right;  . ORIF ANKLE FRACTURE Left 03/19/2016   Procedure: OPEN REDUCTION INTERNAL FIXATION (ORIF) ANKLE FRACTURE;  Surgeon: Dereck Leep, MD;  Location: [facility] ORS;  Service: Orthopedics;  Laterality: Left;  . ORIF PATELLA Right 03/19/2016   Procedure: OPEN REDUCTION INTERNAL (ORIF) FIXATION PATELLA;  Surgeon: Dereck Leep, MD;  Location: [facility] ORS;  Service: Orthopedics;  Laterality: Right;  . TONSILLECTOMY     Social History  Substance Use Topics  . Smoking status: Never Smoker  . Smokeless  tobacco: Never Used  . Alcohol use No   Family History  Problem Relation Age of Onset  . Liver cancer Mother   . Lung cancer Father     Allergies:  Allergies  Allergen Reactions  . Oxycodone Other (See Comments)    "stopped breathing"    Current antibiotics: Antibiotics Given (last 72 hours)    Date/Time Action Medication Dose Rate   03/19/16 0756 Given   ceFAZolin (ANCEF) IVPB 2g/100 mL premix 2 g    03/19/16 1642 Given   rifampin (RIFADIN) 300 mg in sodium chloride 0.9 % 100 mL IVPB 300 mg 200 mL/hr   03/19/16 1651 Given   vancomycin (VANCOCIN) IVPB 1000 mg/200 mL premix 1,000 mg 200 mL/hr   03/19/16 2221 Given   rifampin (RIFADIN) 300 mg in sodium chloride 0.9 % 100 mL IVPB 300 mg 200 mL/hr   03/20/16 0047 Given   vancomycin (VANCOCIN) IVPB 1000 mg/200 mL premix 1,000 mg 200 mL/hr   03/20/16 1203 Given   rifampin (RIFADIN) 300 mg in sodium chloride 0.9 % 100 mL IVPB 300 mg 200 mL/hr   03/20/16 2131 Given   rifampin (RIFADIN) 300 mg in sodium chloride 0.9 % 100 mL IVPB 300 mg 200 mL/hr   03/20/16 2351 Given   vancomycin (VANCOCIN) IVPB 1000 mg/200 mL premix 1,000 mg 200 mL/hr  MEDICATIONS: . atorvastatin  40 mg Oral BH-q7a  . calcium-vitamin D  1 tablet Oral q12n4p  . celecoxib  200 mg Oral BID  . citalopram  20 mg Oral BH-q7a  . enoxaparin (LOVENOX) injection  30 mg Subcutaneous Q12H  . ferrous sulfate  325 mg Oral BID WC  . furosemide  40 mg Oral Daily  . gabapentin  600 mg Oral TID  . insulin aspart  0-15 Units Subcutaneous TID WC  . levothyroxine  75 mcg Oral QAC breakfast  . magnesium oxide  400 mg Oral TID  . metoCLOPramide  10 mg Oral TID AC & HS  . multivitamin with minerals  1 tablet Oral BH-q7a  . nortriptyline  25 mg Oral QHS  . pantoprazole  40 mg Oral BID  . potassium chloride  15 mEq Oral BID  . rifampin (RIFADIN) IVPB  300 mg Intravenous Q12H  . senna-docusate  1 tablet Oral BID  . vancomycin  1,000 mg Intravenous Q24H    Review of  Systems - 11 systems reviewed and negative per HPI   OBJECTIVE: Temp:  [97.9 F (36.6 C)-98.6 F (37 C)] 98.3 F (36.8 C) (12/31 0751) Pulse Rate:  [81-103] 96 (12/31 0751) Resp:  [18] 18 (12/31 0751) BP: (107-147)/(57-78) 124/61 (12/31 0751) SpO2:  [100 %] 100 % (12/31 0751) Physical Exam  Constitutional:  oriented to person, place, and time. appears well-developed and well-nourished. Obese  No distress.  HENT: [place]/AT, PERRLA, no scleral icterus Mouth/Throat: Oropharynx is clear and moist. No oropharyngeal exudate.  Cardiovascular: Normal rate, regular rhythm and normal heart sounds. Exam reveals no gallop and no friction rub.  No murmur heard.  Pulmonary/Chest: Effort normal and breath sounds normal. No respiratory distress.  has no wheezes.  Neck = supple, no nuchal rigidity PICC RUE wnl Abdominal: Soft. Bowel sounds are normal.  exhibits no distension. There is no tenderness.  Lymphadenopathy: no cervical adenopathy. No axillary adenopathy Ext R knee in immobilizer L ankle wrapped post p[  Neurological: alert and oriented to person, place, and time.  Skin: Skin is warm and dry. No rash noted. No erythema.  Psychiatric: a normal mood and affect.  behavior is normal.   LABS: Results for orders placed or performed during the hospital encounter of 03/19/16 (from the past 48 hour(s))  Glucose, capillary     Status: Abnormal   Collection Time: 03/19/16  1:36 PM  Result Value Ref Range   Glucose-Capillary 181 (H) 65 - 99 mg/dL  Glucose, capillary     Status: Abnormal   Collection Time: 03/19/16  9:47 PM  Result Value Ref Range   Glucose-Capillary 108 (H) 65 - 99 mg/dL   Comment 1 Notify RN   CBC     Status: Abnormal   Collection Time: 03/20/16  4:45 AM  Result Value Ref Range   WBC 7.9 3.6 - 11.0 K/uL   RBC 2.75 (L) 3.80 - 5.20 MIL/uL   Hemoglobin 8.2 (L) 12.0 - 16.0 g/dL   HCT 25.0 (L) 35.0 - 47.0 %   MCV 90.9 80.0 - 100.0 fL   MCH 29.7 26.0 - 34.0 pg   MCHC 32.7 32.0 -  36.0 g/dL   RDW 15.9 (H) 11.5 - 14.5 %   Platelets 141 (L) 150 - 440 K/uL  Basic metabolic panel     Status: Abnormal   Collection Time: 03/20/16  4:45 AM  Result Value Ref Range   Sodium 139 135 - 145 mmol/L   Potassium 3.6 3.5 -  5.1 mmol/L   Chloride 106 101 - 111 mmol/L   CO2 29 22 - 32 mmol/L   Glucose, Bld 103 (H) 65 - 99 mg/dL   BUN 21 (H) 6 - 20 mg/dL   Creatinine, Ser 1.32 (H) 0.44 - 1.00 mg/dL   Calcium 8.6 (L) 8.9 - 10.3 mg/dL   GFR calc non Af Amer 39 (L) >60 mL/min   GFR calc Af Amer 45 (L) >60 mL/min    Comment: (NOTE) The eGFR has been calculated using the CKD EPI equation. This calculation has not been validated in all clinical situations. eGFR's persistently <60 mL/min signify possible Chronic Kidney Disease.    Anion gap 4 (L) 5 - 15  Glucose, capillary     Status: Abnormal   Collection Time: 03/20/16  8:11 AM  Result Value Ref Range   Glucose-Capillary 109 (H) 65 - 99 mg/dL   Comment 1 Notify RN   Glucose, capillary     Status: Abnormal   Collection Time: 03/20/16 11:30 AM  Result Value Ref Range   Glucose-Capillary 155 (H) 65 - 99 mg/dL   Comment 1 Notify RN   Glucose, capillary     Status: None   Collection Time: 03/20/16  4:06 PM  Result Value Ref Range   Glucose-Capillary 91 65 - 99 mg/dL   Comment 1 Notify RN   CBC     Status: Abnormal   Collection Time: 03/21/16  4:33 AM  Result Value Ref Range   WBC 8.9 3.6 - 11.0 K/uL   RBC 2.72 (L) 3.80 - 5.20 MIL/uL   Hemoglobin 8.1 (L) 12.0 - 16.0 g/dL   HCT 24.4 (L) 35.0 - 47.0 %   MCV 89.6 80.0 - 100.0 fL   MCH 29.9 26.0 - 34.0 pg   MCHC 33.4 32.0 - 36.0 g/dL   RDW 16.3 (H) 11.5 - 14.5 %   Platelets 173 150 - 440 K/uL  Basic metabolic panel     Status: Abnormal   Collection Time: 03/21/16  4:33 AM  Result Value Ref Range   Sodium 137 135 - 145 mmol/L   Potassium 3.9 3.5 - 5.1 mmol/L   Chloride 104 101 - 111 mmol/L   CO2 30 22 - 32 mmol/L   Glucose, Bld 106 (H) 65 - 99 mg/dL   BUN 17 6 - 20 mg/dL    Creatinine, Ser 1.36 (H) 0.44 - 1.00 mg/dL   Calcium 9.1 8.9 - 10.3 mg/dL   GFR calc non Af Amer 38 (L) >60 mL/min   GFR calc Af Amer 44 (L) >60 mL/min    Comment: (NOTE) The eGFR has been calculated using the CKD EPI equation. This calculation has not been validated in all clinical situations. eGFR's persistently <60 mL/min signify possible Chronic Kidney Disease.    Anion gap 3 (L) 5 - 15  Glucose, capillary     Status: Abnormal   Collection Time: 03/21/16  7:52 AM  Result Value Ref Range   Glucose-Capillary 110 (H) 65 - 99 mg/dL   No components found for: ESR, C REACTIVE PROTEIN MICRO: Recent Results (from the past 720 hour(s))  C difficile quick scan w PCR reflex     Status: None   Collection Time: 03/05/16  7:00 PM  Result Value Ref Range Status   C Diff antigen NEGATIVE NEGATIVE Final   C Diff toxin NEGATIVE NEGATIVE Final   C Diff interpretation No C. difficile detected.  Final  Surgical pcr screen     Status:  None   Collection Time: 03/18/16  2:48 PM  Result Value Ref Range Status   MRSA, PCR NEGATIVE NEGATIVE Final   Staphylococcus aureus NEGATIVE NEGATIVE Final    Comment:        The Xpert SA Assay (FDA approved for NASAL specimens in patients over 68 years of age), is one component of a comprehensive surveillance program.  Test performance has been validated by [facility] for patients greater than or equal to 71 year old. It is not intended to diagnose infection nor to guide or monitor treatment.     IMAGING: Dg Knee 1-2 Views Right  Result Date: 03/19/2016 CLINICAL DATA:  Postop open reduction and internal fixation of patellar fracture. EXAM: DG C-ARM GT 120 MIN; RIGHT KNEE - 1-2 VIEW; LEFT ANKLE - 2 VIEW FLUOROSCOPY TIME:  1 minute and 6 seconds. C-arm fluoroscopic images were obtained intraoperatively and submitted for post operative interpretation. Please see the performing provider's procedural report for the fluoroscopy time utilized. COMPARISON:   Knee radiographs 01/12/2016. No comparison studies of the ankle. FINDINGS: Three spot fluoroscopic images of the ankle (labeled left) demonstrate plate and screw fixation of the distal fibular diaphysis for an oblique fracture. There is near anatomic reduction. No abnormality of the tibia or widening of the ankle mortise identified. Single lateral spot fluoroscopic image of the right knee demonstrates pin and cerclage wire fixation of a transverse patellar fracture. There is near anatomic reduction. Patient is status post right knee arthroplasty. IMPRESSION: Intraoperative views following distal left fibular and right patellar ORIF. No demonstrated complications. Electronically Signed   By: Richardean Sale M.D.   On: 03/19/2016 13:09   Dg Ankle 2 Views Left  Result Date: 03/19/2016 CLINICAL DATA:  Postop open reduction and internal fixation of patellar fracture. EXAM: DG C-ARM GT 120 MIN; RIGHT KNEE - 1-2 VIEW; LEFT ANKLE - 2 VIEW FLUOROSCOPY TIME:  1 minute and 6 seconds. C-arm fluoroscopic images were obtained intraoperatively and submitted for post operative interpretation. Please see the performing provider's procedural report for the fluoroscopy time utilized. COMPARISON:  Knee radiographs 01/12/2016. No comparison studies of the ankle. FINDINGS: Three spot fluoroscopic images of the ankle (labeled left) demonstrate plate and screw fixation of the distal fibular diaphysis for an oblique fracture. There is near anatomic reduction. No abnormality of the tibia or widening of the ankle mortise identified. Single lateral spot fluoroscopic image of the right knee demonstrates pin and cerclage wire fixation of a transverse patellar fracture. There is near anatomic reduction. Patient is status post right knee arthroplasty. IMPRESSION: Intraoperative views following distal left fibular and right patellar ORIF. No demonstrated complications. Electronically Signed   By: Richardean Sale M.D.   On: 03/19/2016 13:09    Dg C-arm Gt 120 Min  Result Date: 03/19/2016 CLINICAL DATA:  Postop open reduction and internal fixation of patellar fracture. EXAM: DG C-ARM GT 120 MIN; RIGHT KNEE - 1-2 VIEW; LEFT ANKLE - 2 VIEW FLUOROSCOPY TIME:  1 minute and 6 seconds. C-arm fluoroscopic images were obtained intraoperatively and submitted for post operative interpretation. Please see the performing provider's procedural report for the fluoroscopy time utilized. COMPARISON:  Knee radiographs 01/12/2016. No comparison studies of the ankle. FINDINGS: Three spot fluoroscopic images of the ankle (labeled left) demonstrate plate and screw fixation of the distal fibular diaphysis for an oblique fracture. There is near anatomic reduction. No abnormality of the tibia or widening of the ankle mortise identified. Single lateral spot fluoroscopic image of the right knee demonstrates  pin and cerclage wire fixation of a transverse patellar fracture. There is near anatomic reduction. Patient is status post right knee arthroplasty. IMPRESSION: Intraoperative views following distal left fibular and right patellar ORIF. No demonstrated complications. Electronically Signed   By: Richardean Sale M.D.   On: 03/19/2016 13:09    Assessment:   Leslie Weber is a 75 y.o. female who underwent TKA by Dr Marry Guan in November but developed a fall and wound dehiscence with reported exposure of joint. Was admitted at [facility] and had washout, exchange and primary closure 11/25. Also noted to have patella fx and trimalleolar ankle fx. She was discharged on vanco and rifampin with stop date 03/30/16. [facility] labs showed [place] neg but I see no other cultures from surgery.  Readmitted now for repeat repair of patella and ankle ORIF done 12/29. CRP  0.8, ESR 40. Per report knee looked good with no evidence infection at surgery  Recommendations Continue vanco and rifampin until 1/9 per [facility] recs I do not feel strongly that she needs suppressive antibiotics although she may have  seeded the prosthetic joint when the wound opened.  She will warrant close follow up once she stops abx with CRP checks q week for a few weeks to ensure not rising.  I can see in follow up if needed.  Thank you very much for allowing me to participate in the care of this patient. Please call with questions.   Cheral Marker. Ola Spurr, MD

## 2016-03-21 NOTE — Anesthesia Postprocedure Evaluation (Signed)
Anesthesia Post Note  Patient: Orli I Macdonell  Procedure(s) Performed: Procedure(s) (LRB): OPEN REDUCTION INTERNAL (ORIF) FIXATION PATELLA (Right) OPEN REDUCTION INTERNAL FIXATION (ORIF) ANKLE FRACTURE (Left)  Patient location during evaluation: PACU Anesthesia Type: General Level of consciousness: awake and alert and oriented Pain management: pain level controlled Vital Signs Assessment: post-procedure vital signs reviewed and stable Respiratory status: spontaneous breathing Cardiovascular status: blood pressure returned to baseline Anesthetic complications: no     Last Vitals:  Vitals:   03/21/16 1908 03/21/16 1910  BP: (!) 121/101 (!) 145/67  Pulse: 99 89  Resp: 19   Temp: 37 C     Last Pain:  Vitals:   03/21/16 1947  TempSrc:   PainSc: 1     LLE Motor Response: Purposeful movement (03/21/16 1948) LLE Sensation: Full sensation (03/21/16 1948) RLE Motor Response: Responds to commands;Purposeful movement (03/21/16 1948) RLE Sensation: Full sensation (03/21/16 1948)      Yves DillARROLL,Sadae Arrazola

## 2016-03-21 NOTE — Progress Notes (Signed)
ORTHOPAEDICS PROGRESS NOTE  PATIENT NAME: Leslie Weber DOB: [birth year]  MRN: [id number]  POD # 2: ORIF right patella fracture, ORIF left trimalleolar ankle fracture (malunion)  Subjective: Awake and alert. The patient complains of some left heel pain. Pain has otherwise been well-controlled. The patient complains of anxiety and difficulty sleeping at night. These issues preceded her index procedure.  Objective: Vital signs in last 24 hours: Temp:  [97.9 F (36.6 C)-98.6 F (37 C)] 98.3 F (36.8 C) (12/31 0751) Pulse Rate:  [81-103] 96 (12/31 0751) Resp:  [18] 18 (12/31 0751) BP: (107-147)/(57-78) 124/61 (12/31 0751) SpO2:  [100 %] 100 % (12/31 0751)  Intake/Output from previous day: 12/30 0701 - 12/31 0700 In: 1240 [P.O.:840; IV Piggyback:400] Out: -    Recent Labs  03/18/16 1448 03/20/16 0445 03/21/16 0433  WBC 8.6 7.9 8.9  HGB 10.2* 8.2* 8.1*  HCT 31.1* 25.0* 24.4*  PLT 110* 141* 173  K 4.6 3.6 3.9  CL 101 106 104  CO2 26 29 30   BUN 22* 21* 17  CREATININE 1.53* 1.32* 1.36*  GLUCOSE 100* 103* 106*  CALCIUM 10.5* 8.6* 9.1  INR 1.00  --   --     EXAM General: Well-developed well-nourished female seen in no acute distress. Left lower extremity: Posterior splint was removed. Lateral ankle incision is well approximated with staples intact. No erythema. No evidence of erythema or breakdown to the ankle. Good capillary refill. New dressing and cam walker was applied Right lower extremity: Dressing was removed. Knee incision is well approximated. No erythema or ecchymosis. Some medial laxity was appreciated. Neurologic: Awake, alert, and oriented. Sensory function is grossly intact.  Assessment: ORIF of a right patella fracture ORIF of left trimalleolar ankle fracture (malunion)  Secondary diagnoses: Sleep apnea Lower extremity neuropathy Hypothyroidism Hypertension Gastroesophageal reflux disease Fibromyalgia Diabetes Depression Coronary artery  disease  Plan: Vital signs and lab work are stable. Notes from physical therapy and occupational therapy were reviewed. Dr. Jarrett AblesFitzgerald's notes were reviewed. I appreciate his recommendations. Plan is to go Skilled nursing facility after hospital stay. DVT Prophylaxis - Lovenox and Foot Pumps  James P. Angie FavaHooten, Jr. M.D.

## 2016-03-22 LAB — BASIC METABOLIC PANEL
ANION GAP: 6 (ref 5–15)
BUN: 18 mg/dL (ref 6–20)
CALCIUM: 9.5 mg/dL (ref 8.9–10.3)
CHLORIDE: 104 mmol/L (ref 101–111)
CO2: 28 mmol/L (ref 22–32)
CREATININE: 1.08 mg/dL — AB (ref 0.44–1.00)
GFR calc non Af Amer: 50 mL/min — ABNORMAL LOW (ref >60)
GFR, EST AFRICAN AMERICAN: 58 mL/min — AB (ref >60)
Glucose, Bld: 104 mg/dL — ABNORMAL HIGH (ref 65–99)
Potassium: 4.1 mmol/L (ref 3.5–5.1)
SODIUM: 138 mmol/L (ref 135–145)

## 2016-03-22 LAB — CBC
HCT: 25.8 % — ABNORMAL LOW (ref 35.0–47.0)
HEMOGLOBIN: 8.5 g/dL — AB (ref 12.0–16.0)
MCH: 29.6 pg (ref 26.0–34.0)
MCHC: 32.9 g/dL (ref 32.0–36.0)
MCV: 89.8 fL (ref 80.0–100.0)
Platelets: 235 10*3/uL (ref 150–440)
RBC: 2.87 MIL/uL — ABNORMAL LOW (ref 3.80–5.20)
RDW: 15.8 % — AB (ref 11.5–14.5)
WBC: 9.7 10*3/uL (ref 3.6–11.0)

## 2016-03-22 LAB — GLUCOSE, CAPILLARY
GLUCOSE-CAPILLARY: 123 mg/dL — AB (ref 65–99)
GLUCOSE-CAPILLARY: 104 mg/dL — AB (ref 65–99)
GLUCOSE-CAPILLARY: 125 mg/dL — AB (ref 65–99)
Glucose-Capillary: 119 mg/dL — ABNORMAL HIGH (ref 65–99)

## 2016-03-22 MED ORDER — VANCOMYCIN HCL IN DEXTROSE 1-5 GM/200ML-% IV SOLN: 1000.0000 mg | INTRAVENOUS | 0 refills | Status: AC

## 2016-03-22 MED ORDER — TRAMADOL HCL 50 MG PO TABS: 50.0000 mg | ORAL_TABLET | Freq: Four times a day (QID) | ORAL | 1 refills | Status: DC | PRN

## 2016-03-22 MED ORDER — SODIUM CHLORIDE 0.9 % IV SOLN: 300.0000 mg | Freq: Two times a day (BID) | INTRAVENOUS | 16 refills | Status: AC

## 2016-03-22 NOTE — Care Management Important Message (Signed)
Important Message  Patient Details  Name: Leslie Weber MRN: [id number] Date of Birth: 1941-02-13   Medicare Important Message Given:  Yes    Eber HongGreene, Oluwademilade Mckiver R, RN 03/22/2016, 12:41 PM

## 2016-03-22 NOTE — Progress Notes (Signed)
PICC line is not drawing back or flushing. RN called vascular access to come assess.  Leslie HeckMelanie Simeon Vera, RN

## 2016-03-22 NOTE — Progress Notes (Signed)
   Subjective: 3 Days Post-Op Procedure(s) (LRB): OPEN REDUCTION INTERNAL (ORIF) FIXATION PATELLA (Right) OPEN REDUCTION INTERNAL FIXATION (ORIF) ANKLE FRACTURE (Left) Patient reports pain as mild.   Patient is well, and has had no acute complaints or problems Denies any CP, SOB, ABD pain. Plan is to go Skilled nursing facility after hospital stay.  Objective: Vital signs in last 24 hours: Temp:  [98.4 F (36.9 C)-98.6 F (37 C)] 98.4 F (36.9 C) (01/01 0759) Pulse Rate:  [89-99] 90 (01/01 0759) Resp:  [18-19] 18 (01/01 0613) BP: (121-161)/(66-101) 161/66 (01/01 0759) SpO2:  [99 %] 99 % (01/01 0759)  Intake/Output from previous day: 12/31 0701 - 01/01 0700 In: 480 [P.O.:480] Out: -  Intake/Output this shift: No intake/output data recorded.   Recent Labs  03/20/16 0445 03/21/16 0433 03/22/16 0415  HGB 8.2* 8.1* 8.5*    Recent Labs  03/21/16 0433 03/22/16 0415  WBC 8.9 9.7  RBC 2.72* 2.87*  HCT 24.4* 25.8*  PLT 173 235    Recent Labs  03/21/16 0433 03/22/16 0415  NA 137 138  K 3.9 4.1  CL 104 104  CO2 30 28  BUN 17 18  CREATININE 1.36* 1.08*  GLUCOSE 106* 104*  CALCIUM 9.1 9.5   No results for input(s): LABPT, INR in the last 72 hours.  EXAM General - Patient is Alert, Appropriate and Oriented Extremities - Neurovascular intact Intact pulses distally Dorsiflexion/Plantar flexion intact No cellulitis present Compartment soft  Dressing - dressing C/D/I and post op boot applied to left ankle, knee hinged brace locked at 0 degrees right knee. Motor Function - intact, moving foot and toes well on exam.   Past Medical History:  Diagnosis Date  . Arthritis   . Coronary artery disease   . Depression   . Diabetes mellitus without complication ([facility])   . Fever due to malaria    76 years old  . Fibromyalgia   . GERD (gastroesophageal reflux disease)   . History of hiatal hernia   . Hypertension   . Hypothyroidism   . Neuromuscular disorder ([facility])     Bells palsy left side of face  . Neuropathy of both feet   . Pneumonia 1997   history of walking pneumonia  . Sleep apnea    not yet had sleep study    Assessment/Plan:   3 Days Post-Op Procedure(s) (LRB): OPEN REDUCTION INTERNAL (ORIF) FIXATION PATELLA (Right) OPEN REDUCTION INTERNAL FIXATION (ORIF) ANKLE FRACTURE (Left) Active Problems:   Patellar fracture   Acute post op blood loss anemia   Estimated body mass index is 39.87 kg/m as calculated from the following:   Height as of this encounter: 5\' 2"  (1.575 m).   Weight as of this encounter: 98.9 kg (218 lb). Hgb 8.5, trending up. VSS Continue with IV abx until 03/31/15, then will need weekly CRP per ID physician. Plan on discharge to SNF today Follow up with [facility] in 2 weeks   DVT Prophylaxis - Lovenox, Foot Pumps and TED hose Nonweight-Bearing bilateral lower extremities   T. Cranston Neighborhris Gaines, PA-C [facility] 03/22/2016, 8:39 AM

## 2016-03-22 NOTE — Progress Notes (Signed)
Patient is medically stable for D/C back to [facility] today. Per Jomarie LongsJoseph [facility] liaison patient can return today to room 703. RN will call report to [address] RN and arrange EMS for transport. Clinical Child psychotherapistocial Worker (CSW) sent D/C orders to [facility] today. Patient and her daughter Alvino Chapelllen are aware of above. Please reconsult if future social work needs arise. CSW signing off.   Baker Hughes IncorporatedBailey Veneda Kirksey, LCSW [phone]

## 2016-03-22 NOTE — Progress Notes (Signed)
Rn gave report to RN at [facility]. AVS printed for facility and daughter (POA). Patient PICC line flushing and getting blood back. Dressing changed today. No pain at this time. Patient wearing an immobilizer on right knee and brace on left ankle. Vital signs stable. EMS called for transport.   Harvie HeckMelanie Tashawnda Bleiler, RN

## 2016-03-22 NOTE — Progress Notes (Signed)
Called in to assess Picc for Leslie Weber.  Upon assessment, picc has gauze dressing dated 03/18/16 with 1 cm external length.  Changed dressing and cap using sterile technique.  Picc unable to be flushed at first.  Flushed X 3 NS with patency restored (positive flush and positive blood return).  Please reconsult if needed.

## 2016-03-29 ENCOUNTER — Other Ambulatory Visit
Admission: RE | Admit: 2016-03-29 | Discharge: 2016-03-29 | Disposition: A | Discharge status: Home / Self Care | Payer: Medicare Other | Source: Ambulatory Visit | Attending: Family Medicine | Admitting: Family Medicine

## 2016-03-29 DIAGNOSIS — Z471 Aftercare following joint replacement surgery: Secondary | ICD-10-CM | POA: Insufficient documentation

## 2016-03-29 DIAGNOSIS — E119 Type 2 diabetes mellitus without complications: Secondary | ICD-10-CM | POA: Diagnosis present

## 2016-03-29 DIAGNOSIS — E039 Hypothyroidism, unspecified: Secondary | ICD-10-CM | POA: Insufficient documentation

## 2016-03-29 DIAGNOSIS — E611 Iron deficiency: Secondary | ICD-10-CM | POA: Insufficient documentation

## 2016-03-29 DIAGNOSIS — G9009 Other idiopathic peripheral autonomic neuropathy: Secondary | ICD-10-CM | POA: Insufficient documentation

## 2016-03-29 DIAGNOSIS — R110 Nausea: Secondary | ICD-10-CM | POA: Insufficient documentation

## 2016-03-29 DIAGNOSIS — R05 Cough: Secondary | ICD-10-CM | POA: Insufficient documentation

## 2016-03-29 DIAGNOSIS — J9611 Chronic respiratory failure with hypoxia: Secondary | ICD-10-CM | POA: Diagnosis present

## 2016-03-29 DIAGNOSIS — Z5189 Encounter for other specified aftercare: Secondary | ICD-10-CM | POA: Insufficient documentation

## 2016-03-29 DIAGNOSIS — E569 Vitamin deficiency, unspecified: Secondary | ICD-10-CM | POA: Diagnosis present

## 2016-03-29 DIAGNOSIS — M84371A Stress fracture, right ankle, initial encounter for fracture: Secondary | ICD-10-CM | POA: Insufficient documentation

## 2016-03-29 DIAGNOSIS — R262 Difficulty in walking, not elsewhere classified: Secondary | ICD-10-CM | POA: Diagnosis present

## 2016-03-29 DIAGNOSIS — K5900 Constipation, unspecified: Secondary | ICD-10-CM | POA: Insufficient documentation

## 2016-03-29 DIAGNOSIS — E785 Hyperlipidemia, unspecified: Secondary | ICD-10-CM | POA: Diagnosis present

## 2016-03-29 DIAGNOSIS — R52 Pain, unspecified: Secondary | ICD-10-CM | POA: Insufficient documentation

## 2016-03-29 DIAGNOSIS — E559 Vitamin D deficiency, unspecified: Secondary | ICD-10-CM | POA: Diagnosis present

## 2016-03-29 DIAGNOSIS — F3289 Other specified depressive episodes: Secondary | ICD-10-CM | POA: Insufficient documentation

## 2016-03-29 DIAGNOSIS — R41841 Cognitive communication deficit: Secondary | ICD-10-CM | POA: Diagnosis present

## 2016-03-29 DIAGNOSIS — N390 Urinary tract infection, site not specified: Secondary | ICD-10-CM | POA: Diagnosis present

## 2016-03-29 DIAGNOSIS — M6281 Muscle weakness (generalized): Secondary | ICD-10-CM | POA: Diagnosis present

## 2016-03-29 DIAGNOSIS — M1990 Unspecified osteoarthritis, unspecified site: Secondary | ICD-10-CM | POA: Diagnosis present

## 2016-03-29 DIAGNOSIS — E876 Hypokalemia: Secondary | ICD-10-CM | POA: Insufficient documentation

## 2016-03-29 DIAGNOSIS — R609 Edema, unspecified: Secondary | ICD-10-CM | POA: Insufficient documentation

## 2016-03-29 DIAGNOSIS — I10 Essential (primary) hypertension: Secondary | ICD-10-CM | POA: Diagnosis present

## 2016-03-29 DIAGNOSIS — K219 Gastro-esophageal reflux disease without esophagitis: Secondary | ICD-10-CM | POA: Diagnosis present

## 2016-03-29 LAB — BUN: BUN: 22 mg/dL — AB (ref 6–20)

## 2016-03-29 LAB — VANCOMYCIN, TROUGH: VANCOMYCIN TR: 15 ug/mL (ref 15–20)

## 2016-04-15 ENCOUNTER — Inpatient Hospital Stay
Admission: EM | Admit: 2016-04-15 | Discharge: 2016-04-21 | DRG: 871 | Disposition: A | Discharge status: Skilled Nursing Facility | Payer: Medicare Other | Attending: Internal Medicine | Admitting: Internal Medicine

## 2016-04-15 ENCOUNTER — Emergency Department: Payer: Medicare Other

## 2016-04-15 DIAGNOSIS — Z794 Long term (current) use of insulin: Secondary | ICD-10-CM | POA: Diagnosis not present

## 2016-04-15 DIAGNOSIS — N390 Urinary tract infection, site not specified: Secondary | ICD-10-CM | POA: Diagnosis present

## 2016-04-15 DIAGNOSIS — G4700 Insomnia, unspecified: Secondary | ICD-10-CM | POA: Diagnosis present

## 2016-04-15 DIAGNOSIS — R059 Cough, unspecified: Secondary | ICD-10-CM

## 2016-04-15 DIAGNOSIS — K219 Gastro-esophageal reflux disease without esophagitis: Secondary | ICD-10-CM | POA: Diagnosis present

## 2016-04-15 DIAGNOSIS — R4182 Altered mental status, unspecified: Secondary | ICD-10-CM | POA: Diagnosis not present

## 2016-04-15 DIAGNOSIS — I248 Other forms of acute ischemic heart disease: Secondary | ICD-10-CM | POA: Diagnosis present

## 2016-04-15 DIAGNOSIS — Z79899 Other long term (current) drug therapy: Secondary | ICD-10-CM

## 2016-04-15 DIAGNOSIS — I472 Ventricular tachycardia: Secondary | ICD-10-CM | POA: Diagnosis not present

## 2016-04-15 DIAGNOSIS — A419 Sepsis, unspecified organism: Secondary | ICD-10-CM | POA: Diagnosis present

## 2016-04-15 DIAGNOSIS — F05 Delirium due to known physiological condition: Secondary | ICD-10-CM

## 2016-04-15 DIAGNOSIS — E1140 Type 2 diabetes mellitus with diabetic neuropathy, unspecified: Secondary | ICD-10-CM | POA: Diagnosis present

## 2016-04-15 DIAGNOSIS — F0150 Vascular dementia without behavioral disturbance: Secondary | ICD-10-CM | POA: Diagnosis present

## 2016-04-15 DIAGNOSIS — M797 Fibromyalgia: Secondary | ICD-10-CM | POA: Diagnosis present

## 2016-04-15 DIAGNOSIS — M6281 Muscle weakness (generalized): Secondary | ICD-10-CM

## 2016-04-15 DIAGNOSIS — F02B18 Dementia in other diseases classified elsewhere, moderate, with other behavioral disturbance: Secondary | ICD-10-CM

## 2016-04-15 DIAGNOSIS — I4901 Ventricular fibrillation: Secondary | ICD-10-CM | POA: Diagnosis not present

## 2016-04-15 DIAGNOSIS — I2510 Atherosclerotic heart disease of native coronary artery without angina pectoris: Secondary | ICD-10-CM | POA: Diagnosis present

## 2016-04-15 DIAGNOSIS — I10 Essential (primary) hypertension: Secondary | ICD-10-CM | POA: Diagnosis present

## 2016-04-15 DIAGNOSIS — E876 Hypokalemia: Secondary | ICD-10-CM | POA: Diagnosis not present

## 2016-04-15 DIAGNOSIS — Z87440 Personal history of urinary (tract) infections: Secondary | ICD-10-CM | POA: Diagnosis not present

## 2016-04-15 DIAGNOSIS — E039 Hypothyroidism, unspecified: Secondary | ICD-10-CM | POA: Diagnosis present

## 2016-04-15 DIAGNOSIS — Z96651 Presence of right artificial knee joint: Secondary | ICD-10-CM | POA: Diagnosis present

## 2016-04-15 DIAGNOSIS — R05 Cough: Secondary | ICD-10-CM

## 2016-04-15 DIAGNOSIS — E1121 Type 2 diabetes mellitus with diabetic nephropathy: Secondary | ICD-10-CM | POA: Diagnosis present

## 2016-04-15 DIAGNOSIS — E785 Hyperlipidemia, unspecified: Secondary | ICD-10-CM | POA: Diagnosis present

## 2016-04-15 DIAGNOSIS — Z791 Long term (current) use of non-steroidal anti-inflammatories (NSAID): Secondary | ICD-10-CM | POA: Diagnosis not present

## 2016-04-15 DIAGNOSIS — R262 Difficulty in walking, not elsewhere classified: Secondary | ICD-10-CM

## 2016-04-15 DIAGNOSIS — G9340 Encephalopathy, unspecified: Secondary | ICD-10-CM | POA: Diagnosis present

## 2016-04-15 DIAGNOSIS — F0281 Dementia in other diseases classified elsewhere with behavioral disturbance: Secondary | ICD-10-CM

## 2016-04-15 DIAGNOSIS — F329 Major depressive disorder, single episode, unspecified: Secondary | ICD-10-CM | POA: Diagnosis present

## 2016-04-15 DIAGNOSIS — R296 Repeated falls: Secondary | ICD-10-CM

## 2016-04-15 HISTORY — DX: Unspecified osteoarthritis, unspecified site: M19.90

## 2016-04-15 LAB — COMPREHENSIVE METABOLIC PANEL
ALT: 9 U/L — AB (ref 14–54)
AST: 14 U/L — AB (ref 15–41)
Albumin: 3.6 g/dL (ref 3.5–5.0)
Alkaline Phosphatase: 84 U/L (ref 38–126)
Anion gap: 9 (ref 5–15)
BUN: 26 mg/dL — ABNORMAL HIGH (ref 6–20)
CO2: 28 mmol/L (ref 22–32)
CREATININE: 1.24 mg/dL — AB (ref 0.44–1.00)
Calcium: 9.9 mg/dL (ref 8.9–10.3)
Chloride: 103 mmol/L (ref 101–111)
GFR, EST AFRICAN AMERICAN: 49 mL/min — AB (ref >60)
GFR, EST NON AFRICAN AMERICAN: 42 mL/min — AB (ref >60)
Glucose, Bld: 148 mg/dL — ABNORMAL HIGH (ref 65–99)
POTASSIUM: 3.8 mmol/L (ref 3.5–5.1)
SODIUM: 140 mmol/L (ref 135–145)
Total Bilirubin: 0.4 mg/dL (ref 0.3–1.2)
Total Protein: 6.9 g/dL (ref 6.5–8.1)

## 2016-04-15 LAB — URINALYSIS, COMPLETE (UACMP) WITH MICROSCOPIC
Bilirubin Urine: NEGATIVE
Glucose, UA: NEGATIVE mg/dL
KETONES UR: NEGATIVE mg/dL
Nitrite: POSITIVE — AB
Protein, ur: 30 mg/dL — AB
SPECIFIC GRAVITY, URINE: 1.015 (ref 1.005–1.030)
pH: 5 (ref 5.0–8.0)

## 2016-04-15 LAB — CBC WITH DIFFERENTIAL/PLATELET
BASOS ABS: 0 10*3/uL (ref 0–0.1)
BLASTS: 0 %
Band Neutrophils: 0 %
Basophils Relative: 0 %
EOS PCT: 0 %
Eosinophils Absolute: 0 10*3/uL (ref 0–0.7)
HEMATOCRIT: 31.6 % — AB (ref 35.0–47.0)
Hemoglobin: 10.7 g/dL — ABNORMAL LOW (ref 12.0–16.0)
LYMPHS ABS: 1.1 10*3/uL (ref 1.0–3.6)
Lymphocytes Relative: 8 %
MCH: 29.9 pg (ref 26.0–34.0)
MCHC: 33.8 g/dL (ref 32.0–36.0)
MCV: 88.7 fL (ref 80.0–100.0)
METAMYELOCYTES PCT: 0 %
MONOS PCT: 5 %
Monocytes Absolute: 0.7 10*3/uL (ref 0.2–0.9)
Myelocytes: 0 %
NEUTROS ABS: 11.8 10*3/uL — AB (ref 1.4–6.5)
NEUTROS PCT: 87 %
NRBC: 0 /100{WBCs}
OTHER: 0 %
PLATELETS: 298 10*3/uL (ref 150–440)
Promyelocytes Absolute: 0 %
RBC: 3.57 MIL/uL — AB (ref 3.80–5.20)
RDW: 15.6 % — AB (ref 11.5–14.5)
WBC: 13.6 10*3/uL — AB (ref 3.6–11.0)

## 2016-04-15 LAB — LACTIC ACID, PLASMA: Lactic Acid, Venous: 0.9 mmol/L (ref 0.5–1.9)

## 2016-04-15 LAB — TROPONIN I: TROPONIN I: 0.03 ng/mL — AB (ref <0.03)

## 2016-04-15 MED ORDER — ACETAMINOPHEN 650 MG RE SUPP
650.0000 mg | Freq: Once | RECTAL | Status: AC
  Administered 2016-04-15: 650 mg via RECTAL

## 2016-04-15 MED ORDER — SODIUM CHLORIDE 0.9 % IV BOLUS (SEPSIS)
1000.0000 mL | Freq: Once | INTRAVENOUS | Status: AC
Start: 2016-04-15 — End: 2016-04-15
  Administered 2016-04-15: 1000 mL via INTRAVENOUS

## 2016-04-15 MED ORDER — SODIUM CHLORIDE 0.9 % IV BOLUS (SEPSIS)
1000.0000 mL | Freq: Once | INTRAVENOUS | Status: AC
  Administered 2016-04-15: 1000 mL via INTRAVENOUS

## 2016-04-15 MED ORDER — VANCOMYCIN HCL IN DEXTROSE 1-5 GM/200ML-% IV SOLN
1000.0000 mg | Freq: Once | INTRAVENOUS | Status: AC
  Administered 2016-04-15: 1000 mg via INTRAVENOUS
  Filled 2016-04-15: qty 200

## 2016-04-15 MED ORDER — ACETAMINOPHEN 650 MG RE SUPP
RECTAL | Status: AC
  Administered 2016-04-15: 650 mg via RECTAL
  Filled 2016-04-15: qty 1

## 2016-04-15 MED ORDER — PIPERACILLIN-TAZOBACTAM 3.375 G IVPB 30 MIN
3.3750 g | Freq: Once | INTRAVENOUS | Status: AC
  Administered 2016-04-15: 3.375 g via INTRAVENOUS
  Filled 2016-04-15: qty 50

## 2016-04-15 NOTE — H&P (Signed)
[facility] - [place] at [facility]   PATIENT NAME: Leslie Weber    MR#:  [id number]  DATE OF BIRTH:  Jun 24, 1940  DATE OF ADMISSION:  04/15/2016  PRIMARY CARE PHYSICIAN: Rafael Bihari, MD   REQUESTING/REFERRING PHYSICIAN: Derrill Kay, MD  CHIEF COMPLAINT:   Chief Complaint  Patient presents with  . Altered Mental Status    HISTORY OF PRESENT ILLNESS:  Leslie Weber  is a 76 y.o. female who presents with Significantly increased confusion. Patient was brought from her nursing facility for the same. Here she was found to meet sepsis criteria and has UTI. Hospitalists were called for admission and treatment. Of note, this UTI came after multiple orthopedic surgeries on both of her lower extremities over the past 2-3 months.  PAST MEDICAL HISTORY:   Past Medical History:  Diagnosis Date  . Arthritis   . Coronary artery disease   . Depression   . Diabetes mellitus without complication (HCC)   . Fever due to malaria    76 years old  . Fibromyalgia   . GERD (gastroesophageal reflux disease)   . History of hiatal hernia   . Hypertension   . Hypothyroidism   . Neuromuscular disorder (HCC)    Bells palsy left side of face  . Neuropathy of both feet   . Pneumonia 1997   history of walking pneumonia  . Sleep apnea    not yet had sleep study    PAST SURGICAL HISTORY:   Past Surgical History:  Procedure Laterality Date  . ABDOMINAL HYSTERECTOMY    . APPENDECTOMY    . BACK SURGERY    . BLADDER SUSPENSION  2002  . CHOLECYSTECTOMY    . KNEE ARTHROPLASTY Right 01/12/2016   Procedure: COMPUTER ASSISTED TOTAL KNEE ARTHROPLASTY;  Surgeon: Donato Heinz, MD;  Location: [facility] ORS;  Service: Orthopedics;  Laterality: Right;  . ORIF ANKLE FRACTURE Left 03/19/2016   Procedure: OPEN REDUCTION INTERNAL FIXATION (ORIF) ANKLE FRACTURE;  Surgeon: Donato Heinz, MD;  Location: [facility] ORS;  Service: Orthopedics;  Laterality: Left;  . ORIF PATELLA Right 03/19/2016   Procedure: OPEN REDUCTION INTERNAL (ORIF) FIXATION PATELLA;  Surgeon: Donato Heinz, MD;  Location: [facility] ORS;  Service: Orthopedics;  Laterality: Right;  . TONSILLECTOMY      SOCIAL HISTORY:   Social History  Substance Use Topics  . Smoking status: Never Smoker  . Smokeless tobacco: Never Used  . Alcohol use No    FAMILY HISTORY:   Family History  Problem Relation Age of Onset  . Liver cancer Mother   . Lung cancer Father     DRUG ALLERGIES:   Allergies  Allergen Reactions  . Oxycodone Other (See Comments)    "stopped breathing"    MEDICATIONS AT HOME:   Prior to Admission medications   Medication Sig Start Date End Date Taking? Authorizing Provider  acetaminophen (TYLENOL) 500 MG tablet Take 500 mg by mouth every 6 (six) hours.   Yes Historical Provider, MD  atorvastatin (LIPITOR) 40 MG tablet Take 40 mg by mouth every morning.   Yes Historical Provider, MD  Calcium Carb-Cholecalciferol (CALCIUM 600+D) 600-800 MG-UNIT TABS Take 1 Dose by mouth 2 times daily at 12 noon and 4 pm.   Yes Historical Provider, MD  celecoxib (CELEBREX) 200 MG capsule Take 200 mg by mouth 2 (two) times daily.   Yes Historical Provider, MD  citalopram (CELEXA) 20 MG tablet Take 20 mg by mouth every morning.   Yes Historical Provider,  MD  ertapenem 1 g in sodium chloride 0.9 % 50 mL Inject 1 g into the vein daily. 04/13/16 04/20/16 Yes Historical Provider, MD  ferrous sulfate 325 (65 FE) MG tablet Take 325 mg by mouth daily with breakfast.   Yes Historical Provider, MD  gabapentin (NEURONTIN) 600 MG tablet Take 600 mg by mouth 3 (three) times daily.   Yes Historical Provider, MD  levothyroxine (SYNTHROID, LEVOTHROID) 50 MCG tablet Take 50 mcg by mouth daily before breakfast.   Yes Historical Provider, MD  liraglutide (VICTOZA) 18 MG/3ML SOPN Inject 0.6 mg into the skin every morning.   Yes Historical Provider, MD  Multiple Vitamins-Minerals (CENTRUM WOMEN PO) Take 1 tablet by mouth every morning.    Yes Historical Provider, MD  nortriptyline (PAMELOR) 75 MG capsule Take 75 mg by mouth at bedtime.   Yes Historical Provider, MD  pantoprazole (PROTONIX) 40 MG tablet Take 40 mg by mouth daily before breakfast.    Yes Historical Provider, MD  traMADol (ULTRAM) 50 MG tablet Take 1 tablet (50 mg total) by mouth every 6 (six) hours as needed for moderate pain. 03/22/16  Yes Evon Slackhomas C Gaines, PA-C    REVIEW OF SYSTEMS:  Review of Systems  Unable to perform ROS: Acuity of condition     VITAL SIGNS:   Vitals:   04/15/16 2200 04/15/16 2230 04/15/16 2300 04/15/16 2351  BP: (!) 150/93 (!) 174/89 (!) 148/97   Pulse: (!) 124 (!) 125 (!) 127   Resp: 19 (!) 24 (!) 24   Temp:    (!) 102.7 F (39.3 C)  TempSrc:    Axillary  SpO2: 100% 100% 100%   Weight:      Height:       Wt Readings from Last 3 Encounters:  04/15/16 90.7 kg (200 lb)  03/19/16 98.9 kg (218 lb)  03/18/16 98.9 kg (218 lb)    PHYSICAL EXAMINATION:  Physical Exam  Vitals reviewed. Constitutional: She appears well-developed and well-nourished. No distress.  HENT:  Head: Normocephalic and atraumatic.  Dry mucous membranes  Eyes: Conjunctivae and EOM are normal. Pupils are equal, round, and reactive to light. No scleral icterus.  Neck: Normal range of motion. Neck supple. No JVD present. No thyromegaly present.  Cardiovascular: Normal rate, regular rhythm and intact distal pulses.  Exam reveals no gallop and no friction rub.   No murmur heard. Respiratory: Effort normal and breath sounds normal. No respiratory distress. She has no wheezes. She has no rales.  GI: Soft. Bowel sounds are normal. She exhibits no distension. There is no tenderness.  Musculoskeletal: Normal range of motion. She exhibits no edema.  No arthritis, no gout  Lymphadenopathy:    She has no cervical adenopathy.  Neurological:  Unable to assess due to patient condition  Skin: Skin is warm and dry. No rash noted. No erythema.  Psychiatric:  Unable to  assess due to patient condition    LABORATORY PANEL:   CBC  Recent Labs Lab 04/15/16 2032  WBC 13.6*  HGB 10.7*  HCT 31.6*  PLT 298   ------------------------------------------------------------------------------------------------------------------  Chemistries   Recent Labs Lab 04/15/16 2032  NA 140  K 3.8  CL 103  CO2 28  GLUCOSE 148*  BUN 26*  CREATININE 1.24*  CALCIUM 9.9  AST 14*  ALT 9*  ALKPHOS 84  BILITOT 0.4   ------------------------------------------------------------------------------------------------------------------  Cardiac Enzymes  Recent Labs Lab 04/15/16 2032  TROPONINI 0.03*   ------------------------------------------------------------------------------------------------------------------  RADIOLOGY:  [address]  Result Date: 04/15/2016 CLINICAL DATA:  Mental status changes EXAM: PORTABLE CHEST 1 VIEW COMPARISON:  01/15/2016 FINDINGS: 2017 hours. Stable marked asymmetric elevation right hemidiaphragm. Basilar atelectasis again noted. The cardiopericardial silhouette is within normal limits for size. Hiatal hernia evident. The visualized bony structures of the thorax are intact. Apparent right PICC line is evident with tip in the region of the subclavian vein. IMPRESSION: Stable. Asymmetric elevation right hemidiaphragm with bibasilar atelectasis. Hiatal hernia. Electronically Signed   By: Kennith Center M.D.   On: 04/15/2016 20:35    EKG:   Orders placed or performed during the hospital encounter of 01/15/16  . ED EKG  . ED EKG  . EKG 12-Lead  . EKG 12-Lead    IMPRESSION AND PLAN:  Principal Problem:   Sepsis (HCC) - broad spectrum IV antibiotics started in the ED, continued on admission, patient is hemodynamically stable, lactic acid within normal limits, cultures sent from the ED Active Problems:   UTI (urinary tract infection) - antibiotics and cultures as above   Hypertension - continue home meds   Coronary artery  disease - continue home medications   Type 2 diabetes with nephropathy (HCC) - sliding scale insulin with corresponding glucose checks   Hypothyroidism - home dose thyroid replacement   HLD (hyperlipidemia) - home dose anti-lipids  All the records are reviewed and case discussed with ED provider. Management plans discussed with the patient and/or family.  DVT PROPHYLAXIS: SubQ lovenox  GI PROPHYLAXIS: None  ADMISSION STATUS: Inpatient  CODE STATUS: Full Code Status History    Date Active Date Inactive Code Status Order ID Comments User Context   03/19/2016  3:10 PM 03/22/2016  5:15 PM Full Code [id number]  Donato Heinz, MD Inpatient   01/15/2016  8:06 PM 01/16/2016  6:23 PM Full Code [id number]  Enid Baas, MD ED   01/12/2016  3:43 PM 01/14/2016 10:03 PM Full Code [id number]  Donato Heinz, MD Inpatient      TOTAL TIME TAKING CARE OF THIS PATIENT: 45 minutes.    Jadon Ressler FIELDING 04/15/2016, 11:59 PM  [employer] [facility]  Office  [phone]  CC: Primary care physician; Rafael Bihari, MD

## 2016-04-15 NOTE — ED Triage Notes (Signed)
PER EMS: Patient from [facility]. Pt being treated for UTI. Pt unable increasingly altered last 2 days. Pt's tachycardic, tachypneic, febrile. Pt normaly 2L [place], Pt increased to 4L  per EMS

## 2016-04-15 NOTE — ED Provider Notes (Signed)
[facility] Emergency Department Provider Note   ____________________________________________   I have reviewed the triage vital signs and the nursing notes.   HISTORY  Chief Complaint Altered Mental Status   History limited by: Altered Mental Status   HPI Leslie Weber is a 76 y.o. female who presents to the emergency department today via EMS because of concerns for sepsis. Per EMS the patient has been undergoing treatment for UTIs. Patient has been on multiple UTI medications. Today the staff at [facility] with concern that she might be septic. Patient herself is unable to give any history.    Past Medical History:  Diagnosis Date  . Arthritis   . Coronary artery disease   . Depression   . Diabetes mellitus without complication (HCC)   . Fever due to malaria    76 years old  . Fibromyalgia   . GERD (gastroesophageal reflux disease)   . History of hiatal hernia   . Hypertension   . Hypothyroidism   . Neuromuscular disorder (HCC)    Bells palsy left side of face  . Neuropathy of both feet   . Pneumonia 1997   history of walking pneumonia  . Sleep apnea    not yet had sleep study    Patient Active Problem List   Diagnosis Date Noted  . Patellar fracture 03/19/2016  . Wound dehiscence 02/17/2016  . Altered mental status 01/15/2016  . S/P total knee arthroplasty 01/12/2016  . Pulmonary arterial hypertension 11/04/2015  . 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 Obesity 05/03/2014  . Peripheral neuropathy (HCC) 05/03/2014  . Spinal stenosis 05/03/2014  . Hypothyroidism 05/03/2014  . Hypertension 05/03/2014  . Hyperlipidemia, unspecified 05/03/2014  . Coronary artery disease 05/03/2014  . DDD (degenerative disc disease), lumbar 01/08/2014    Past Surgical History:  Procedure Laterality Date  . ABDOMINAL HYSTERECTOMY    . APPENDECTOMY    . BACK SURGERY    . BLADDER SUSPENSION  2002  . CHOLECYSTECTOMY    . KNEE  ARTHROPLASTY Right 01/12/2016   Procedure: COMPUTER ASSISTED TOTAL KNEE ARTHROPLASTY;  Surgeon: Donato Heinz, MD;  Location: [facility] ORS;  Service: Orthopedics;  Laterality: Right;  . ORIF ANKLE FRACTURE Left 03/19/2016   Procedure: OPEN REDUCTION INTERNAL FIXATION (ORIF) ANKLE FRACTURE;  Surgeon: Donato Heinz, MD;  Location: [facility] ORS;  Service: Orthopedics;  Laterality: Left;  . ORIF PATELLA Right 03/19/2016   Procedure: OPEN REDUCTION INTERNAL (ORIF) FIXATION PATELLA;  Surgeon: Donato Heinz, MD;  Location: [facility] ORS;  Service: Orthopedics;  Laterality: Right;  . TONSILLECTOMY      Prior to Admission medications   Medication Sig Start Date End Date Taking? Authorizing Provider  acetaminophen (TYLENOL) 500 MG tablet Take 500 mg by mouth every 6 (six) hours.    Historical Provider, MD  atorvastatin (LIPITOR) 40 MG tablet Take 40 mg by mouth every morning.    Historical Provider, MD  Calcium Carb-Cholecalciferol (CALCIUM 600+D) 600-800 MG-UNIT TABS Take 1 Dose by mouth 2 times daily at 12 noon and 4 pm.    Historical Provider, MD  celecoxib (CELEBREX) 200 MG capsule Take 200 mg by mouth 2 (two) times daily.    Historical Provider, MD  ciprofloxacin (CIPRO) 500 MG tablet Take 1 tablet (500 mg total) by mouth 2 (two) times daily. 01/16/16   Shaune Pollack, MD  citalopram (CELEXA) 20 MG tablet Take 20 mg by mouth every morning.    Historical Provider, MD  enoxaparin (LOVENOX) 40  MG/0.4ML injection Inject 0.4 mLs (40 mg total) into the skin daily. 03/21/16   Anson Oregon, PA-C  Ferrous Sulfate Dried 45 MG TBCR Take 1 tablet by mouth every morning.    Historical Provider, MD  gabapentin (NEURONTIN) 300 MG capsule Take 300 mg by mouth 3 (three) times daily. 2 Capsules 3 times a day.    Historical Provider, MD  levothyroxine (SYNTHROID, LEVOTHROID) 50 MCG tablet Take 50 mcg by mouth daily before breakfast.    Historical Provider, MD  liraglutide (VICTOZA) 18 MG/3ML SOPN Inject 0.6 mg into the skin  every morning.    Historical Provider, MD  Multiple Vitamins-Minerals (CENTRUM WOMEN PO) Take 1 tablet by mouth every morning.    Historical Provider, MD  nortriptyline (PAMELOR) 75 MG capsule Take 75 mg by mouth at bedtime.    Historical Provider, MD  pantoprazole (PROTONIX) 40 MG tablet Take 40 mg by mouth daily before breakfast.     Historical Provider, MD  traMADol (ULTRAM) 50 MG tablet Take 1 tablet (50 mg total) by mouth every 6 (six) hours as needed for moderate pain. 03/22/16   Evon Slack, PA-C    Allergies Oxycodone  Family History  Problem Relation Age of Onset  . Liver cancer Mother   . Lung cancer Father     Social History Social History  Substance Use Topics  . Smoking status: Never Smoker  . Smokeless tobacco: Never Used  . Alcohol use No    Review of Systems Unable to obtain secondary to altered mental status  ____________________________________________   PHYSICAL EXAM:  VITAL SIGNS: ED Triage Vitals  Enc Vitals Group     BP 04/15/16 1942 (!) 150/80     Pulse Rate 04/15/16 1942 (!) 123     Resp 04/15/16 1942 (!) 23     Temp 04/15/16 1942 (!) 100.6 F (38.1 C)     Temp Source 04/15/16 1942 Axillary     SpO2 04/15/16 1938 (!) 82 %     Weight 04/15/16 1942 200 lb (90.7 kg)     Height 04/15/16 1942 5\' 4"  (1.626 m)    Constitutional: Awake, minimally alert. Eyes: Conjunctivae are normal. Normal extraocular movements. ENT   Head: Normocephalic and atraumatic.   Nose: No congestion/rhinnorhea.   Mouth/Throat: Mucous membranes are dry   Neck: No stridor. Hematological/Lymphatic/Immunilogical: No cervical lymphadenopathy. Cardiovascular: Tachycardic, regular rhythm.  No murmurs, rubs, or gallops.  Respiratory: Normal respiratory effort without tachypnea nor retractions. Breath sounds are clear and equal bilaterally. No wheezes/rales/rhonchi. Gastrointestinal: Soft and non tender. No rebound. No guarding.  Genitourinary:  Deferred Musculoskeletal: Normal range of motion in all extremities. No lower extremity edema. Neurologic:  Awake, minimally alert. Appears to move all extremities. Limited exam. Skin:  Skin is warm, dry and intact. No rash noted.   ____________________________________________    LABS (pertinent positives/negatives)  Labs Reviewed  COMPREHENSIVE METABOLIC PANEL - Abnormal; Notable for the following:       Result Value   Glucose, Bld 148 (*)    BUN 26 (*)    Creatinine, Ser 1.24 (*)    AST 14 (*)    ALT 9 (*)    GFR calc non Af Amer 42 (*)    GFR calc Af Amer 49 (*)    All other components within normal limits  TROPONIN I - Abnormal; Notable for the following:    Troponin I 0.03 (*)    All other components within normal limits  CBC WITH DIFFERENTIAL/PLATELET - Abnormal;  Notable for the following:    WBC 13.6 (*)    RBC 3.57 (*)    Hemoglobin 10.7 (*)    HCT 31.6 (*)    RDW 15.6 (*)    Neutro Abs 11.8 (*)    All other components within normal limits  URINALYSIS, COMPLETE (UACMP) WITH MICROSCOPIC - Abnormal; Notable for the following:    Color, Urine YELLOW (*)    APPearance TURBID (*)    Hgb urine dipstick SMALL (*)    Protein, ur 30 (*)    Nitrite POSITIVE (*)    Leukocytes, UA LARGE (*)    Bacteria, UA MANY (*)    Squamous Epithelial / LPF 0-5 (*)    All other components within normal limits  CULTURE, BLOOD (ROUTINE X 2)  CULTURE, BLOOD (ROUTINE X 2)  URINE CULTURE  LACTIC ACID, PLASMA  LACTIC ACID, PLASMA     ____________________________________________   EKG  I, Phineas SemenGraydon Arlinda Barcelona, attending physician, personally viewed and interpreted this EKG  EKG Time: 1944 Rate: 124 Rhythm: sinus tachycardia Axis: left axis deviation Intervals: qtc 430 QRS: narrow ST changes: no st elevation Impression: abnormal ekg   ____________________________________________    RADIOLOGY  CXR IMPRESSION: Stable. Asymmetric elevation right hemidiaphragm with  bibasilar atelectasis.  Hiatal hernia.  ____________________________________________   PROCEDURES  Procedures  CRITICAL CARE Performed by: Phineas SemenGOODMAN, Sylena Lotter   Total critical care time: 30 minutes  Critical care time was exclusive of separately billable procedures and treating other patients.  Critical care was necessary to treat or prevent imminent or life-threatening deterioration.  Critical care was time spent personally by me on the following activities: development of treatment plan with patient and/or surrogate as well as nursing, discussions with consultants, evaluation of patient's response to treatment, examination of patient, obtaining history from patient or surrogate, ordering and performing treatments and interventions, ordering and review of laboratory studies, ordering and review of radiographic studies, pulse oximetry and re-evaluation of patient's condition.  ____________________________________________   INITIAL IMPRESSION / ASSESSMENT AND PLAN / ED COURSE  Pertinent labs & imaging results that were available during my care of the patient were reviewed by me and considered in my medical decision making (see chart for details).  Patient presented to the emergency department today because of concerns for altered mental status and possible sepsis. On exam patient is minimally alert, she is awake. Unable to give any history. Patient will begin multiple broad-spectrum antibiotics.  Urine did come back concerning for UTI. Daughters are now present. The also discussed that she is having some increasing confusion and sundowning symptoms for the past couple of weeks. Will plan on admission to the hospital service.  ____________________________________________   FINAL CLINICAL IMPRESSION(S) / ED DIAGNOSES  Final diagnoses:  Altered mental status, unspecified altered mental status type  Lower urinary tract infectious disease  Sepsis, due to unspecified organism [facility](HCC)      Note: This dictation was prepared with Dragon dictation. Any transcriptional errors that result from this process are unintentional     Phineas SemenGraydon Nykeem Citro, MD 04/15/16 2342

## 2016-04-16 LAB — GLUCOSE, CAPILLARY
GLUCOSE-CAPILLARY: 120 mg/dL — AB (ref 65–99)
GLUCOSE-CAPILLARY: 121 mg/dL — AB (ref 65–99)
GLUCOSE-CAPILLARY: 105 mg/dL — AB (ref 65–99)
Glucose-Capillary: 157 mg/dL — ABNORMAL HIGH (ref 65–99)

## 2016-04-16 LAB — CBC
HEMATOCRIT: 33.9 % — AB (ref 35.0–47.0)
HEMOGLOBIN: 10.9 g/dL — AB (ref 12.0–16.0)
MCH: 29.4 pg (ref 26.0–34.0)
MCHC: 32.1 g/dL (ref 32.0–36.0)
MCV: 91.7 fL (ref 80.0–100.0)
Platelets: 255 10*3/uL (ref 150–440)
RBC: 3.7 MIL/uL — ABNORMAL LOW (ref 3.80–5.20)
RDW: 15.8 % — ABNORMAL HIGH (ref 11.5–14.5)
WBC: 17 10*3/uL — AB (ref 3.6–11.0)

## 2016-04-16 LAB — BASIC METABOLIC PANEL
Anion gap: 10 (ref 5–15)
BUN: 22 mg/dL — AB (ref 6–20)
CHLORIDE: 108 mmol/L (ref 101–111)
CO2: 22 mmol/L (ref 22–32)
Calcium: 9.1 mg/dL (ref 8.9–10.3)
Creatinine, Ser: 1.18 mg/dL — ABNORMAL HIGH (ref 0.44–1.00)
GFR calc Af Amer: 52 mL/min — ABNORMAL LOW (ref >60)
GFR calc non Af Amer: 45 mL/min — ABNORMAL LOW (ref >60)
GLUCOSE: 157 mg/dL — AB (ref 65–99)
POTASSIUM: 3.4 mmol/L — AB (ref 3.5–5.1)
SODIUM: 140 mmol/L (ref 135–145)

## 2016-04-16 LAB — INFLUENZA PANEL BY PCR (TYPE A & B)
Influenza A By PCR: NEGATIVE
Influenza B By PCR: NEGATIVE

## 2016-04-16 LAB — MAGNESIUM: Magnesium: 1.1 mg/dL — ABNORMAL LOW (ref 1.7–2.4)

## 2016-04-16 MED ORDER — ATORVASTATIN CALCIUM 20 MG PO TABS: 40.0000 mg | ORAL_TABLET | ORAL | Status: DC

## 2016-04-16 MED ORDER — TRAMADOL HCL 50 MG PO TABS: 50.0000 mg | ORAL_TABLET | Freq: Four times a day (QID) | ORAL | Status: DC | PRN

## 2016-04-16 MED ORDER — MAGNESIUM SULFATE 2 GM/50ML IV SOLN
2.0000 g | Freq: Once | INTRAVENOUS | Status: AC
  Administered 2016-04-16: 2 g via INTRAVENOUS
  Filled 2016-04-16: qty 50

## 2016-04-16 MED ORDER — LABETALOL HCL 5 MG/ML IV SOLN
5.0000 mg | INTRAVENOUS | Status: DC | PRN
  Administered 2016-04-16: 05:00:00 5 mg via INTRAVENOUS
  Filled 2016-04-16 (×2): qty 4

## 2016-04-16 MED ORDER — PIPERACILLIN-TAZOBACTAM 3.375 G IVPB: 3.3750 g | Freq: Three times a day (TID) | INTRAVENOUS | Status: DC

## 2016-04-16 MED ORDER — ENOXAPARIN SODIUM 40 MG/0.4ML ~~LOC~~ SOLN
40.0000 mg | SUBCUTANEOUS | Status: DC
  Administered 2016-04-16 – 2016-04-20 (×5): 40 mg via SUBCUTANEOUS
  Filled 2016-04-16 (×5): qty 0.4

## 2016-04-16 MED ORDER — DEXTROSE 5 % IV SOLN
2.0000 g | Freq: Two times a day (BID) | INTRAVENOUS | Status: DC
  Filled 2016-04-16 (×2): qty 2

## 2016-04-16 MED ORDER — SODIUM CHLORIDE 0.9 % IV SOLN
1.0000 g | Freq: Two times a day (BID) | INTRAVENOUS | Status: DC
  Filled 2016-04-16 (×2): qty 1

## 2016-04-16 MED ORDER — VANCOMYCIN HCL IN DEXTROSE 1-5 GM/200ML-% IV SOLN
1000.0000 mg | INTRAVENOUS | Status: DC
  Administered 2016-04-16 – 2016-04-19 (×5): 1000 mg via INTRAVENOUS
  Filled 2016-04-16 (×7): qty 200

## 2016-04-16 MED ORDER — SODIUM CHLORIDE 0.9 % IV SOLN
INTRAVENOUS | Status: DC
  Administered 2016-04-16 – 2016-04-17 (×3): via INTRAVENOUS

## 2016-04-16 MED ORDER — SODIUM CHLORIDE 0.9 % IV BOLUS (SEPSIS)
1000.0000 mL | Freq: Once | INTRAVENOUS | Status: AC
  Administered 2016-04-16: 05:00:00 1000 mL via INTRAVENOUS

## 2016-04-16 MED ORDER — POTASSIUM CHLORIDE CRYS ER 20 MEQ PO TBCR: 40.0000 meq | EXTENDED_RELEASE_TABLET | Freq: Once | ORAL | Status: DC

## 2016-04-16 MED ORDER — PIPERACILLIN-TAZOBACTAM 4.5 G IVPB
4.5000 g | Freq: Three times a day (TID) | INTRAVENOUS | Status: DC
  Administered 2016-04-16: 03:00:00 4.5 g via INTRAVENOUS
  Filled 2016-04-16 (×3): qty 100

## 2016-04-16 MED ORDER — LEVOTHYROXINE SODIUM 50 MCG PO TABS: 50.0000 ug | ORAL_TABLET | Freq: Every day | ORAL | Status: DC

## 2016-04-16 MED ORDER — GABAPENTIN 600 MG PO TABS
600.0000 mg | ORAL_TABLET | Freq: Three times a day (TID) | ORAL | Status: DC
Start: 2016-04-16 — End: 2016-04-16

## 2016-04-16 MED ORDER — MEROPENEM-SODIUM CHLORIDE 1 GM/50ML IV SOLR
1.0000 g | Freq: Two times a day (BID) | INTRAVENOUS | Status: DC
  Administered 2016-04-16 – 2016-04-17 (×4): 1 g via INTRAVENOUS
  Filled 2016-04-16 (×6): qty 50

## 2016-04-16 MED ORDER — SODIUM CHLORIDE 0.9 % IV SOLN
200.0000 mg | INTRAVENOUS | Status: DC
  Administered 2016-04-17 – 2016-04-18 (×2): 200 mg via INTRAVENOUS
  Filled 2016-04-16 (×3): qty 200

## 2016-04-16 MED ORDER — SODIUM CHLORIDE 0.9 % IV SOLN
INTRAVENOUS | Status: AC
Start: 2016-04-16 — End: 2016-04-16
  Administered 2016-04-16: 02:00:00 via INTRAVENOUS

## 2016-04-16 MED ORDER — ACETAMINOPHEN 650 MG RE SUPP
650.0000 mg | Freq: Four times a day (QID) | RECTAL | Status: DC | PRN
  Administered 2016-04-16 (×3): 650 mg via RECTAL
  Filled 2016-04-16 (×3): qty 1

## 2016-04-16 MED ORDER — ACETAMINOPHEN 325 MG PO TABS: 650.0000 mg | ORAL_TABLET | Freq: Four times a day (QID) | ORAL | Status: DC | PRN

## 2016-04-16 MED ORDER — NORTRIPTYLINE HCL 25 MG PO CAPS: 75.0000 mg | ORAL_CAPSULE | Freq: Every day | ORAL | Status: DC

## 2016-04-16 MED ORDER — INSULIN ASPART 100 UNIT/ML ~~LOC~~ SOLN
0.0000 [IU] | Freq: Three times a day (TID) | SUBCUTANEOUS | Status: DC
  Administered 2016-04-16: 18:00:00 1 [IU] via SUBCUTANEOUS
  Administered 2016-04-16: 2 [IU] via SUBCUTANEOUS
  Administered 2016-04-18: 1 [IU] via SUBCUTANEOUS
  Administered 2016-04-19: 2 [IU] via SUBCUTANEOUS
  Administered 2016-04-19 – 2016-04-21 (×3): 1 [IU] via SUBCUTANEOUS
  Filled 2016-04-16: qty 2
  Filled 2016-04-16 (×2): qty 1
  Filled 2016-04-16: qty 2
  Filled 2016-04-16 (×3): qty 1

## 2016-04-16 MED ORDER — POTASSIUM CHLORIDE 2 MEQ/ML IV SOLN
30.0000 meq | Freq: Once | INTRAVENOUS | Status: AC
  Administered 2016-04-16: 30 meq via INTRAVENOUS
  Filled 2016-04-16: qty 15

## 2016-04-16 MED ORDER — LEVOTHYROXINE SODIUM 100 MCG IV SOLR
25.0000 ug | Freq: Every day | INTRAVENOUS | Status: DC
  Administered 2016-04-16 – 2016-04-17 (×2): 25 ug via INTRAVENOUS
  Filled 2016-04-16 (×3): qty 5

## 2016-04-16 MED ORDER — PANTOPRAZOLE SODIUM 40 MG PO TBEC: 40.0000 mg | DELAYED_RELEASE_TABLET | Freq: Every day | ORAL | Status: DC

## 2016-04-16 MED ORDER — SODIUM CHLORIDE 0.9% FLUSH
3.0000 mL | Freq: Two times a day (BID) | INTRAVENOUS | Status: DC
  Administered 2016-04-16 – 2016-04-21 (×9): 3 mL via INTRAVENOUS

## 2016-04-16 MED ORDER — HYDRALAZINE HCL 20 MG/ML IJ SOLN: 10.0000 mg | Freq: Four times a day (QID) | INTRAMUSCULAR | Status: DC | PRN

## 2016-04-16 MED ORDER — SODIUM CHLORIDE 0.9 % IV SOLN
100.0000 mg | Freq: Once | INTRAVENOUS | Status: AC
  Administered 2016-04-16: 100 mg via INTRAVENOUS
  Filled 2016-04-16: qty 100

## 2016-04-16 MED ORDER — CITALOPRAM HYDROBROMIDE 20 MG PO TABS: 20.0000 mg | ORAL_TABLET | ORAL | Status: DC

## 2016-04-16 MED ORDER — INSULIN ASPART 100 UNIT/ML ~~LOC~~ SOLN: 0.0000 [IU] | Freq: Every day | SUBCUTANEOUS | Status: DC

## 2016-04-16 NOTE — Progress Notes (Signed)
Telemetry reviewed and patient appeared to have an nonsustained episode of Vfib (torsades).  Order Mg and K check.  Replete both.

## 2016-04-16 NOTE — Progress Notes (Signed)
[employer] [facility] [place] [facility]   PATIENT NAME: Leslie Weber    MR#:  [id number]  DATE OF BIRTH:  September 09, 1940  SUBJECTIVE:   Patient not very responsive. She continues to have fevers  REVIEW OF SYSTEMS:    Review of Systems  Unable to perform ROS: Acuity of condition    Tolerating Diet: no      DRUG ALLERGIES:   Allergies  Allergen Reactions  . Oxycodone Other (See Comments)    "stopped breathing"    VITALS:  Blood pressure (!) 144/54, pulse (!) 110, temperature (!) 102.1 F (38.9 C), resp. rate (!) 24, height 5\' 4"  (1.626 m), weight 97.8 kg (215 lb 9 oz), SpO2 99 %.  PHYSICAL EXAMINATION:   Physical Exam  Constitutional: She is well-developed, well-nourished, and in no distress. No distress.  Lethargic  HENT:  Head: Normocephalic.  Eyes: No scleral icterus.  Neck: Neck supple. No JVD present. No tracheal deviation present.  Cardiovascular: Normal rate, regular rhythm and normal heart sounds.  Exam reveals no gallop and no friction rub.   No murmur heard. Pulmonary/Chest: Effort normal and breath sounds normal. No respiratory distress. She has no wheezes. She has no rales. She exhibits no tenderness.  Abdominal: Soft. Bowel sounds are normal. She exhibits no distension and no mass. There is no tenderness. There is no rebound and no guarding.  Musculoskeletal: She exhibits no edema or deformity.  She has  Neurological:  Lethargic  Skin: Skin is warm. No rash noted. No erythema.  Right leg is warm to touch      LABORATORY PANEL:   CBC  Recent Labs Lab 04/16/16 0455  WBC 17.0*  HGB 10.9*  HCT 33.9*  PLT 255   ------------------------------------------------------------------------------------------------------------------  Chemistries   Recent Labs Lab 04/15/16 2032 04/16/16 0455  NA 140 140  K 3.8 3.4*  CL 103 108  CO2 28 22  GLUCOSE 148* 157*  BUN 26* 22*  CREATININE 1.24* 1.18*  CALCIUM 9.9 9.1  MG  --  1.1*  AST  14*  --   ALT 9*  --   ALKPHOS 84  --   BILITOT 0.4  --    ------------------------------------------------------------------------------------------------------------------  Cardiac Enzymes  Recent Labs Lab 04/15/16 2032  TROPONINI 0.03*   ------------------------------------------------------------------------------------------------------------------  RADIOLOGY:  Ct Head Wo Contrast  Result Date: 04/16/2016 CLINICAL DATA:  76 year old female with falls. EXAM: CT HEAD WITHOUT CONTRAST TECHNIQUE: Contiguous axial images were obtained from the base of the skull through the vertex without intravenous contrast. COMPARISON:  None. FINDINGS: Brain: There is mild age-related atrophy and chronic microvascular ischemic changes. There is no acute intracranial hemorrhage. No mass effect or midline shift noted. No extra-axial fluid collection. Vascular: No hyperdense vessel or unexpected calcification. Skull: Normal. Negative for fracture or focal lesion. Sinuses/Orbits: Minimal mucoperiosteal thickening of paranasal sinuses. No air-fluid levels. Mastoid air cells are clear. The globes are intact. Other: None IMPRESSION: No acute intracranial hemorrhage. Mild age-related atrophy and chronic microvascular ischemic changes. Electronically Signed   By: Elgie CollardArash  Radparvar M.D.   On: 04/16/2016 00:13   Dg Chest Port 1 View  Result Date: 04/15/2016 CLINICAL DATA:  Mental status changes EXAM: PORTABLE CHEST 1 VIEW COMPARISON:  01/15/2016 FINDINGS: 2017 hours. Stable marked asymmetric elevation right hemidiaphragm. Basilar atelectasis again noted. The cardiopericardial silhouette is within normal limits for size. Hiatal hernia evident. The visualized bony structures of the thorax are intact. Apparent right PICC line is evident with tip in the region of the  subclavian vein. IMPRESSION: Stable. Asymmetric elevation right hemidiaphragm with bibasilar atelectasis. Hiatal hernia. Electronically Signed   By: Kennith Center M.D.   On: 04/15/2016 20:35     ASSESSMENT AND PLAN:    76 year old female with a history of CAD, multiple UTIs and recent knee replacement with PICC line who presents with sepsis.  1. Sepsis: Patient presents with fever, tachycardia leukocytosis Patient has urinary tract infection but may also have bloodstream infection from PICC line or infection from knee replacement. Change Zosyn to cefepime/ meropenem and continue vancomycin ID and orthopedic surgery consultation Tylenol rectally for fevers  2. Urinary tract infection: Follow-up on final ID  3. Hypothyroidism: Changed to IV Synthroid  4. History of CAD: Hold oral medications  5. Essential hypertension: Use when necessary labetalol and hydralazine  6. Diabetes: Sliding scale  7. Nonsustained V. tach: K and magnesium repletion [facility] consult. ECHO  Management plans discussed with the patient's daughter and she is in agreement.  CODE STATUS: FULL  TOTAL TIME TAKING CARE OF THIS PATIENT: 30 minutes.     POSSIBLE D/C 3-5 days, DEPENDING ON CLINICAL CONDITION.   Nikesh Teschner M.D on 04/16/2016 at 10:07 AM  Between 7am to 6pm - Pager - [phone] After 6pm go to [email or web address] - password [email or web address]  [facility]  Office  [phone]  CC: Primary care physician; Rafael Bihari, MD  Note: This dictation was prepared with Dragon dictation along with smaller phrase technology. Any transcriptional errors that result from this process are unintentional.

## 2016-04-16 NOTE — Progress Notes (Addendum)
Pharmacy Antibiotic Note  Leslie Weber is a 76 y.o. female admitted on 04/15/2016 with sepsis.  Pharmacy has been consulted for vancomycin and Zosyn dosing. Zosyn changed to meropenem. Anidulafungin added per ID.   Plan: DW 69kg  Vd 48L kei 0.042 hr-1  T1/2 17 hours Vancomycin 1 gram q 18 hours ordered with stacked dosing. Level before 5th dose. Goal trough 15-20.  Meropenem 1 g iv q 12 hours.   Anidulafungin 200 mg iv once then 100 mg iv q 24h.   Height: 5\' 4"  (162.6 cm) Weight: 200 lb (90.7 kg) IBW/kg (Calculated) : 54.7  Temp (24hrs), Avg:101.7 F (38.7 C), Min:100.6 F (38.1 C), Max:102.7 F (39.3 C)   Recent Labs Lab 04/15/16 2032  WBC 13.6*  CREATININE 1.24*  LATICACIDVEN 0.9    Estimated Creatinine Clearance: 44.1 mL/min (by C-G formula based on SCr of 1.24 mg/dL (H)).    Allergies  Allergen Reactions  . Oxycodone Other (See Comments)    "stopped breathing"    Antimicrobials this admission: vancomycin 1/25 >>  Zosyn 1/25 >>   Dose adjustments this admission:   Microbiology results: 1/25 BCx: pending 1/25 UCx: pending  03/18/16 MRSA (-)     1/25 UA: LE(+) NO2(+) WBC TNTC 1/25 CXR: bibasilar atelectasis  Thank you for allowing pharmacy to be a part of this patient'Weber care.  Leslie Weber,Leslie Weber 04/16/2016 12:11 AM

## 2016-04-16 NOTE — Consult Note (Signed)
ORTHOPAEDIC CONSULTATION  REQUESTING PHYSICIAN: Adrian Saran, MD  Chief Complaint:   Sepsis.  History of Present Illness: Leslie Weber is a 76 y.o. female with multiple medical problems and a complicated recent orthopedic history. Apparently, she underwent a right total knee arthroplasty by Dr. Ernest Pine on 01/12/16. Her early postoperative horse was unremarkable. However, while in rehabilitation over Thanksgiving, she apparently fell and twisted her knee, causing her wound to dehisce while carrying her medial collateral ligament and sustaining a transverse fracture through the inferior pole of her patella, just distal to the patella component. She was transported urgently to [facility] and taken to the operating room where her wound was irrigated thoroughly, a polyethylene exchange performed, and her medial collateral ligament and patellar fractures fixed. That postoperative course was complicated by need for transfusions and ICU care for a week or so. She was to rehabilitation and followed up with Dr. Ernest Pine. Dr. Ernest Pine brought her back to the operating room to re-fix her patella fracture in late December. The patient had been doing well and was seen in follow-up most recently on 04/13/16. At that time, her knee wound appeared to be healing well and was without evidence for infection, although her patellar fracture had displaced slightly. The patient's daughter, who has provided much of the recent history, notes that the patient "has not been herself"over the past 2 weeks or so. She has been concerned that the patient may have an occult infection causing her confusion. She also notes that the patient has rolled out of bed and fallen onto the mattresses placed on the floor around her bed on several occasions, especially over the past few weeks. The patient began spiking fevers yesterday and so was brought to the emergency room. She was admitted  for further evaluation and treatment. A urinalysis was performed which demonstrated evidence of a UTI. Because of some warmth around the right knee and because of her recent multiple orthopedic procedures on her right knee, an orthopedic consultation has been requested.  Past Medical History:  Diagnosis Date  . Arthritis   . Coronary artery disease    a. 11/2014 CT chest w/ coronary atherosclerosis.  . Depression   . Diabetes mellitus without complication (HCC)   . Fever due to malaria    76 years old  . Fibromyalgia   . GERD (gastroesophageal reflux disease)   . History of hiatal hernia   . Hypertension   . Hypothyroidism   . Neuromuscular disorder (HCC)    Bells palsy left side of face  . Neuropathy of both feet   . Osteoarthritis    a. s/p R TKA 12/2015.  Marland Kitchen Pneumonia 1997   history of walking pneumonia  . Sleep apnea    not yet had sleep study   Past Surgical History:  Procedure Laterality Date  . ABDOMINAL HYSTERECTOMY    . APPENDECTOMY    . BACK SURGERY    . BLADDER SUSPENSION  2002  . CHOLECYSTECTOMY    . KNEE ARTHROPLASTY Right 01/12/2016   Procedure: COMPUTER ASSISTED TOTAL KNEE ARTHROPLASTY;  Surgeon: Donato Heinz, MD;  Location: [facility] ORS;  Service: Orthopedics;  Laterality: Right;  . ORIF ANKLE FRACTURE Left 03/19/2016   Procedure: OPEN REDUCTION INTERNAL FIXATION (ORIF) ANKLE FRACTURE;  Surgeon: Donato Heinz, MD;  Location: [facility] ORS;  Service: Orthopedics;  Laterality: Left;  . ORIF PATELLA Right 03/19/2016   Procedure: OPEN REDUCTION INTERNAL (ORIF) FIXATION PATELLA;  Surgeon: Donato Heinz, MD;  Location: [facility] ORS;  Service: Orthopedics;  Laterality: Right;  . TONSILLECTOMY     Social History   Social History  . Marital status: Married    Spouse name: N/A  . Number of children: N/A  . Years of education: N/A   Social History Main Topics  . Smoking status: Never Smoker  . Smokeless tobacco: Never Used  . Alcohol use No  . Drug use: No  . Sexual  activity: Not Asked   Other Topics Concern  . None   Social History Narrative   Lives at home with husband. Uses a walker at baseline.   Family History  Problem Relation Age of Onset  . Liver cancer Mother   . Lung cancer Father    Allergies  Allergen Reactions  . Oxycodone Other (See Comments)    "stopped breathing"   Prior to Admission medications   Medication Sig Start Date End Date Taking? Authorizing Provider  acetaminophen (TYLENOL) 500 MG tablet Take 500 mg by mouth every 6 (six) hours.   Yes Historical Provider, MD  atorvastatin (LIPITOR) 40 MG tablet Take 40 mg by mouth every morning.   Yes Historical Provider, MD  Calcium Carb-Cholecalciferol (CALCIUM 600+D) 600-800 MG-UNIT TABS Take 1 Dose by mouth 2 times daily at 12 noon and 4 pm.   Yes Historical Provider, MD  celecoxib (CELEBREX) 200 MG capsule Take 200 mg by mouth 2 (two) times daily.   Yes Historical Provider, MD  citalopram (CELEXA) 20 MG tablet Take 20 mg by mouth every morning.   Yes Historical Provider, MD  ertapenem 1 g in sodium chloride 0.9 % 50 mL Inject 1 g into the vein daily. 04/13/16 04/20/16 Yes Historical Provider, MD  ferrous sulfate 325 (65 FE) MG tablet Take 325 mg by mouth daily with breakfast.   Yes Historical Provider, MD  gabapentin (NEURONTIN) 600 MG tablet Take 600 mg by mouth 3 (three) times daily.   Yes Historical Provider, MD  levothyroxine (SYNTHROID, LEVOTHROID) 50 MCG tablet Take 50 mcg by mouth daily before breakfast.   Yes Historical Provider, MD  liraglutide (VICTOZA) 18 MG/3ML SOPN Inject 0.6 mg into the skin every morning.   Yes Historical Provider, MD  Multiple Vitamins-Minerals (CENTRUM WOMEN PO) Take 1 tablet by mouth every morning.   Yes Historical Provider, MD  nortriptyline (PAMELOR) 75 MG capsule Take 75 mg by mouth at bedtime.   Yes Historical Provider, MD  pantoprazole (PROTONIX) 40 MG tablet Take 40 mg by mouth daily before breakfast.    Yes Historical Provider, MD  traMADol  (ULTRAM) 50 MG tablet Take 1 tablet (50 mg total) by mouth every 6 (six) hours as needed for moderate pain. 03/22/16  Yes Evon Slackhomas C Gaines, PA-C   Ct Head Wo Contrast  Result Date: 04/16/2016 CLINICAL DATA:  76 year old female with falls. EXAM: CT HEAD WITHOUT CONTRAST TECHNIQUE: Contiguous axial images were obtained from the base of the skull through the vertex without intravenous contrast. COMPARISON:  None. FINDINGS: Brain: There is mild age-related atrophy and chronic microvascular ischemic changes. There is no acute intracranial hemorrhage. No mass effect or midline shift noted. No extra-axial fluid collection. Vascular: No hyperdense vessel or unexpected calcification. Skull: Normal. Negative for fracture or focal lesion. Sinuses/Orbits: Minimal mucoperiosteal thickening of paranasal sinuses. No air-fluid levels. Mastoid air cells are clear. The globes are intact. Other: None IMPRESSION: No acute intracranial hemorrhage. Mild age-related atrophy and chronic microvascular ischemic changes. Electronically Signed   By: Elgie CollardArash  Radparvar M.D.   On: 04/16/2016 00:13  Dg Chest Port 1 View  Result Date: 04/15/2016 CLINICAL DATA:  Mental status changes EXAM: PORTABLE CHEST 1 VIEW COMPARISON:  01/15/2016 FINDINGS: 2017 hours. Stable marked asymmetric elevation right hemidiaphragm. Basilar atelectasis again noted. The cardiopericardial silhouette is within normal limits for size. Hiatal hernia evident. The visualized bony structures of the thorax are intact. Apparent right PICC line is evident with tip in the region of the subclavian vein. IMPRESSION: Stable. Asymmetric elevation right hemidiaphragm with bibasilar atelectasis. Hiatal hernia. Electronically Signed   By: Kennith Center M.D.   On: 04/15/2016 20:35    Positive ROS: All other systems have been reviewed and were otherwise negative with the exception of those mentioned in the HPI and as above.  Physical Exam: General:  Alert, no acute  distress Psychiatric:  Patient is confused   Cardiovascular:  No pedal edema Respiratory:  No wheezing, non-labored breathing GI:  Abdomen is soft and non-tender Skin:  No lesions in the area of chief complaint Neurologic:  Sensation intact distally Lymphatic:  No axillary or cervical lymphadenopathy  Orthopedic Exam:  Orthopedic examination is limited to the right lower extremity. The surgical incision appears to be well-healed and without erythema, drainage, or other evidence of infection at this time. There is mild warmth around the knee, but only mild swelling and at most a trace effusion. No attempt is made to range the knee as she is still recovering from a recent ORIF of a displaced right patella fracture. However, the knee is stable to gentle varus and valgus stressing. She has fair capillary refill to her right foot.  X-rays:  X-rays of her right knee from the office for 3 days ago have been reviewed. These films demonstrate excellent position of the femoral, tibial, and patella components without evidence of loosening. The transverse patella fracture itself appears to be displaced by 1 cm, although the hardware remains in excellent position.  Assessment: Status post right total knee arthroplasty with postoperative course complicated by transverse fracture of inferior pole of patella and large wound dehiscence secondary to fall 4 weeks postoperatively.  Plan: At this point, I do not see any evidence for infection of the right knee, despite her recent significant wound dehiscence and subsequent surgeries. There is no effusion and no significant erythema around the knee incision site. Therefore, I do not see any need or to consider a knee aspiration at this time. Given that she has a another source of her infection, specifically the urinary tract infection, I feel that this should be treated first with appropriate antibiotics. Meanwhile, we will continue to monitor the right knee closely  for evidence of infection while she is here. I have spoken with Dr. Ernest Pine, her primary orthopedic surgeon, about her situation. He has requested that a formal consultation for Dr. Sampson Goon be placed to be sure that she is on the appropriate antibiotic regimen in order to both treat her active infection and to protect her knee. Meanwhile, her knee is to be kept in the hinged knee brace locked in extension.  Thank you for asking me to participate in the care of this most unfortunate woman. We will follow her with you.   Maryagnes Amos, MD  Beeper #:  [phone]  04/16/2016 4:40 PM

## 2016-04-16 NOTE — Plan of Care (Signed)
Problem: Safety: Goal: Ability to remain free from injury will improve Outcome: Not Progressing Pt contiues to have increased temps. MD aware.  Daughter at bedside.    Problem: Skin Integrity: Goal: Risk for impaired skin integrity will decrease Outcome: Progressing Pt has foam on gluteal area for prevention   Problem: Urinary Elimination: Goal: Signs and symptoms of infection will decrease Outcome: Not Progressing Temp continues to be elevated. Tylenol and cooling blanket applied

## 2016-04-16 NOTE — Progress Notes (Deleted)
Pt. Placed on cooling rectal probe in place

## 2016-04-16 NOTE — Consult Note (Signed)
[facility] CLINIC CARDIOLOGY [facility]  CARDIOLOGY CONSULT NOTE  Patient ID: Leslie Weber MRN: [id number] DOB/AGE: [birth year] 76 y.o.  Admit date: 04/15/2016  Reason for Consultation Nonsustained ventricular tachycardia  HPI: Patient is a 76 year old female with history of coronary artery disease, diabetes who was admitted from a nursing facility with altered mental status and increasing confusion. She was found to meet sepsis criteria with a urinary tract infection. She has a UTI after multiple orthopedic surgeries of her lower extremities. She had some dehiscence of her knee after suffering a fall. She was noted to have nonsustained wide complex tachycardia. During that episode her serum potassium and magnesium was significantly low. She has had no further dysrhythmia. Electrolytes are being replaced over her magnesium is still 2.8. Echocardiogram is pending.  ROS  Past Medical History:  Diagnosis Date  . Arthritis   . Coronary artery disease    a. 11/2014 CT chest w/ coronary atherosclerosis.  . Depression   . Diabetes mellitus without complication (HCC)   . Fever due to malaria    76 years old  . Fibromyalgia   . GERD (gastroesophageal reflux disease)   . History of hiatal hernia   . Hypertension   . Hypothyroidism   . Neuromuscular disorder (HCC)    Bells palsy left side of face  . Neuropathy of both feet   . Osteoarthritis    a. s/p R TKA 12/2015.  Marland Kitchen Pneumonia 1997   history of walking pneumonia  . Sleep apnea    not yet had sleep study    Family History  Problem Relation Age of Onset  . Liver cancer Mother   . Lung cancer Father     Social History   Social History  . Marital status: Married    Spouse name: N/A  . Number of children: N/A  . Years of education: N/A   Occupational History  . Not on file.   Social History Main Topics  . Smoking status: Never Smoker  . Smokeless tobacco: Never Used  . Alcohol use No  . Drug use: No  . Sexual  activity: Not on file   Other Topics Concern  . Not on file   Social History Narrative   Lives at home with husband. Uses a walker at baseline.    Past Surgical History:  Procedure Laterality Date  . ABDOMINAL HYSTERECTOMY    . APPENDECTOMY    . BACK SURGERY    . BLADDER SUSPENSION  2002  . CHOLECYSTECTOMY    . KNEE ARTHROPLASTY Right 01/12/2016   Procedure: COMPUTER ASSISTED TOTAL KNEE ARTHROPLASTY;  Surgeon: Donato Heinz, MD;  Location: [facility] ORS;  Service: Orthopedics;  Laterality: Right;  . ORIF ANKLE FRACTURE Left 03/19/2016   Procedure: OPEN REDUCTION INTERNAL FIXATION (ORIF) ANKLE FRACTURE;  Surgeon: Donato Heinz, MD;  Location: [facility] ORS;  Service: Orthopedics;  Laterality: Left;  . ORIF PATELLA Right 03/19/2016   Procedure: OPEN REDUCTION INTERNAL (ORIF) FIXATION PATELLA;  Surgeon: Donato Heinz, MD;  Location: [facility] ORS;  Service: Orthopedics;  Laterality: Right;  . TONSILLECTOMY       Prescriptions Prior to Admission  Medication Sig Dispense Refill Last Dose  . acetaminophen (TYLENOL) 500 MG tablet Take 500 mg by mouth every 6 (six) hours.   04/15/2016 at Unknown time  . atorvastatin (LIPITOR) 40 MG tablet Take 40 mg by mouth every morning.   04/15/2016 at Unknown time  . Calcium Carb-Cholecalciferol (CALCIUM 600+D) 600-800 MG-UNIT TABS Take 1  Dose by mouth 2 times daily at 12 noon and 4 pm.   04/15/2016 at Unknown time  . celecoxib (CELEBREX) 200 MG capsule Take 200 mg by mouth 2 (two) times daily.   04/15/2016 at am  . citalopram (CELEXA) 20 MG tablet Take 20 mg by mouth every morning.   04/15/2016 at Unknown time  . ertapenem 1 g in sodium chloride 0.9 % 50 mL Inject 1 g into the vein daily.   04/15/2016 at Unknown time  . ferrous sulfate 325 (65 FE) MG tablet Take 325 mg by mouth daily with breakfast.   04/15/2016 at Unknown time  . gabapentin (NEURONTIN) 600 MG tablet Take 600 mg by mouth 3 (three) times daily.   04/15/2016 at Unknown time  . levothyroxine (SYNTHROID,  LEVOTHROID) 50 MCG tablet Take 50 mcg by mouth daily before breakfast.   04/15/2016 at Unknown time  . liraglutide (VICTOZA) 18 MG/3ML SOPN Inject 0.6 mg into the skin every morning.   04/15/2016 at Unknown time  . Multiple Vitamins-Minerals (CENTRUM WOMEN PO) Take 1 tablet by mouth every morning.   04/15/2016 at Unknown time  . nortriptyline (PAMELOR) 75 MG capsule Take 75 mg by mouth at bedtime.   04/14/2016 at Unknown time  . pantoprazole (PROTONIX) 40 MG tablet Take 40 mg by mouth daily before breakfast.    04/15/2016 at Unknown time  . traMADol (ULTRAM) 50 MG tablet Take 1 tablet (50 mg total) by mouth every 6 (six) hours as needed for moderate pain. 16 tablet 1 prn    Physical Exam: Blood pressure (!) 141/79, pulse (!) 124, temperature (!) 102.5 F (39.2 C), temperature source Rectal, resp. rate (!) 24, height 5\' 4"  (1.626 m), weight 215 lb 9 oz (97.8 kg), SpO2 100 %.   Wt Readings from Last 1 Encounters:  04/16/16 215 lb 9 oz (97.8 kg)     General appearance: alert and cooperative Resp: clear to auscultation bilaterally Cardio: regular rate and rhythm Neurologic: Grossly normal  Labs:   Lab Results  Component Value Date   WBC 17.0 (H) 04/16/2016   HGB 10.9 (L) 04/16/2016   HCT 33.9 (L) 04/16/2016   MCV 91.7 04/16/2016   PLT 255 04/16/2016    Recent Labs Lab 04/15/16 2032 04/16/16 0455  NA 140 140  K 3.8 3.4*  CL 103 108  CO2 28 22  BUN 26* 22*  CREATININE 1.24* 1.18*  CALCIUM 9.9 9.1  PROT 6.9  --   BILITOT 0.4  --   ALKPHOS 84  --   ALT 9*  --   AST 14*  --   GLUCOSE 148* 157*   Lab Results  Component Value Date   TROPONINI 0.03 (HH) 04/15/2016      Radiology:  EKG: nsr   ASSESSMENT AND PLAN:  76 yo female with history of multiple medical problems with compliated post orthopaedic surgery course with increasing confusion and evidence of wound dehiscence.She was noted to have nsvt. She had significant hypokalemia and hypomagnasemia. Will continue to follw and  repleat k and mg. eCHO  Pending. Further recs after echo.  Signed: Dalia HeadingKenneth A Cassadie Pankonin MD, [facility] 04/16/2016, 7:56 PM

## 2016-04-16 NOTE — Progress Notes (Signed)
Pt. placed on cooling blanket with rectal temp probe in place

## 2016-04-16 NOTE — Progress Notes (Addendum)
Electrolyte replacement  Pharmacy consulted for electrolyte replacement in this 76 y/o F.  1/26 AM K+ 3.4, Mg 1.1.  Hospitalist ordered KCl 40 mEq PO x1 and 2 grams magnesium sulfate IV x1. Recheck BMP and Mg with tomorrow AM labs.  Gave an additional 2 g mag sulfate iv x 1. F/U AM labs.   Luisa HartScott Mahasin Riviere, PharmD Clinical Pharmacist

## 2016-04-16 NOTE — Consult Note (Signed)
[facility] Infectious Disease     Reason for Consult:fever    Referring Physician: Norberta Keens  Date of Admission:  04/15/2016   Principal Problem:   Sepsis ([place]) Active Problems:   Hypothyroidism   Hypertension   HLD (hyperlipidemia)   Coronary artery disease   Type 2 diabetes with nephropathy ([place])   UTI (urinary tract infection)   HPI: Leslie Weber is a 76 y.o. female admitted from [facility]F with AMS as well as fever. She was found to have temp 102.7, wbc 13->17 and UA with TNTC WBC and WBC clumps. BCX UCX pending. Started on vanco and zosyn. Flu PCR neg, CXR neg.   She has a recent hx of repeat repair of R patella fx and L ankle repair. Complicated history with fall and dehiscence of post op wound and had relatively immediate polyethylene exchange and wash out.  Was treated with IV  vanco and oral rifampin from [facility] admission  recnet ESR 12/28 was 40, CRP<0.8.   Last seen by ortho and knee seemed to be doing well. Had apaprently finished IV abx but picc still in place.   Past Medical History:  Diagnosis Date  . Arthritis   . Coronary artery disease    a. 11/2014 CT chest w/ coronary atherosclerosis.  . Depression   . Diabetes mellitus without complication ([place])   . Fever due to malaria    76 years old  . Fibromyalgia   . GERD (gastroesophageal reflux disease)   . History of hiatal hernia   . Hypertension   . Hypothyroidism   . Neuromuscular disorder ([place])    Bells palsy left side of face  . Neuropathy of both feet   . Osteoarthritis    a. s/p R TKA 12/2015.  Marland Kitchen Pneumonia 1997   history of walking pneumonia  . Sleep apnea    not yet had sleep study   Past Surgical History:  Procedure Laterality Date  . ABDOMINAL HYSTERECTOMY    . APPENDECTOMY    . BACK SURGERY    . BLADDER SUSPENSION  2002  . CHOLECYSTECTOMY    . KNEE ARTHROPLASTY Right 01/12/2016   Procedure: COMPUTER ASSISTED TOTAL KNEE ARTHROPLASTY;  Surgeon: Dereck Leep, MD;  Location: [facility] ORS;  Service:  Orthopedics;  Laterality: Right;  . ORIF ANKLE FRACTURE Left 03/19/2016   Procedure: OPEN REDUCTION INTERNAL FIXATION (ORIF) ANKLE FRACTURE;  Surgeon: Dereck Leep, MD;  Location: [facility] ORS;  Service: Orthopedics;  Laterality: Left;  . ORIF PATELLA Right 03/19/2016   Procedure: OPEN REDUCTION INTERNAL (ORIF) FIXATION PATELLA;  Surgeon: Dereck Leep, MD;  Location: [facility] ORS;  Service: Orthopedics;  Laterality: Right;  . TONSILLECTOMY     Social History  Substance Use Topics  . Smoking status: Never Smoker  . Smokeless tobacco: Never Used  . Alcohol use No   Family History  Problem Relation Age of Onset  . Liver cancer Mother   . Lung cancer Father     Allergies:  Allergies  Allergen Reactions  . Oxycodone Other (See Comments)    "stopped breathing"    Current antibiotics: Antibiotics Given (last 72 hours)    Date/Time Action Medication Dose Rate   04/16/16 0324 Given   piperacillin-tazobactam (ZOSYN) IVPB 4.5 g 4.5 g 25 mL/hr   04/16/16 0541 Given   vancomycin (VANCOCIN) IVPB 1000 mg/200 mL premix 1,000 mg 200 mL/hr   04/16/16 1301 Given   meropenem (MERREM) IVPB SOLR 1 g 1 g 100 mL/hr  MEDICATIONS: . enoxaparin (LOVENOX) injection  40 mg Subcutaneous Q24H  . insulin aspart  0-5 Units Subcutaneous QHS  . insulin aspart  0-9 Units Subcutaneous TID WC  . levothyroxine  25 mcg Intravenous Daily  . magnesium sulfate 1 - 4 g bolus IVPB  2 g Intravenous Once  . meropenem  1 g Intravenous Q12H  . potassium chloride (KCL MULTIRUN) 30 mEq in 265 mL IVPB  30 mEq Intravenous Once  . sodium chloride flush  3 mL Intravenous Q12H  . vancomycin  1,000 mg Intravenous Q18H    Review of Systems - confused  OBJECTIVE: Temp:  [99.2 F (37.3 C)-103.2 F (39.6 C)] 103.2 F (39.6 C) (01/26 1251) Pulse Rate:  [105-127] 124 (01/26 1251) Resp:  [16-24] 24 (01/26 1251) BP: (128-183)/(54-99) 141/79 (01/26 1251) SpO2:  [82 %-100 %] 100 % (01/26 1251) Weight:  [90.7 kg (200  lb)-97.8 kg (215 lb 9 oz)] 97.8 kg (215 lb 9 oz) (01/26 0200) Physical Exam  Constitutional:  Lethargic, but arousable and able to mumble answers HENT: [place]/AT, PERRLA, no scleral icterus Mouth/Throat: Oropharynx is clear and dry. No oropharyngeal exudate.  Cardiovascular: Tachy reg Pulmonary/Chest: Effort normal and breath sounds normal. No respiratory distress.  has no wheezes.  Neck = supple, no nuchal rigidity Abdominal: Soft. Bowel sounds are normal.  exhibits no distension. There is no tenderness.  Lymphadenopathy: no cervical adenopathy. No axillary adenopathy Neurological: Lethargic, but arousable Ext R knee with well approxiamted incision, no redness, drainage. Only mild warmth Skin: Skin is warm and dry. No rash noted. No erythema.  RUE prior PICC site WNL Psychiatric: lethargic  LABS: Results for orders placed or performed during the hospital encounter of 04/15/16 (from the past 48 hour(s))  Lactic acid, plasma     Status: None   Collection Time: 04/15/16  8:32 PM  Result Value Ref Range   Lactic Acid, Venous 0.9 0.5 - 1.9 mmol/L  Comprehensive metabolic panel     Status: Abnormal   Collection Time: 04/15/16  8:32 PM  Result Value Ref Range   Sodium 140 135 - 145 mmol/L   Potassium 3.8 3.5 - 5.1 mmol/L   Chloride 103 101 - 111 mmol/L   CO2 28 22 - 32 mmol/L   Glucose, Bld 148 (H) 65 - 99 mg/dL   BUN 26 (H) 6 - 20 mg/dL   Creatinine, Ser 1.24 (H) 0.44 - 1.00 mg/dL   Calcium 9.9 8.9 - 10.3 mg/dL   Total Protein 6.9 6.5 - 8.1 g/dL   Albumin 3.6 3.5 - 5.0 g/dL   AST 14 (L) 15 - 41 U/L   ALT 9 (L) 14 - 54 U/L   Alkaline Phosphatase 84 38 - 126 U/L   Total Bilirubin 0.4 0.3 - 1.2 mg/dL   GFR calc non Af Amer 42 (L) >60 mL/min   GFR calc Af Amer 49 (L) >60 mL/min    Comment: (NOTE) The eGFR has been calculated using the CKD EPI equation. This calculation has not been validated in all clinical situations. eGFR's persistently <60 mL/min signify possible Chronic  Kidney Disease.    Anion gap 9 5 - 15  Troponin I     Status: Abnormal   Collection Time: 04/15/16  8:32 PM  Result Value Ref Range   Troponin I 0.03 (HH) <0.03 ng/mL    Comment: CRITICAL RESULT CALLED TO, READ BACK BY AND VERIFIED WITH JENNA ELLINGTON ON 04/15/16 AT [address] [facility]   CBC WITH DIFFERENTIAL  Status: Abnormal   Collection Time: 04/15/16  8:32 PM  Result Value Ref Range   WBC 13.6 (H) 3.6 - 11.0 K/uL   RBC 3.57 (L) 3.80 - 5.20 MIL/uL   Hemoglobin 10.7 (L) 12.0 - 16.0 g/dL   HCT 31.6 (L) 35.0 - 47.0 %   MCV 88.7 80.0 - 100.0 fL   MCH 29.9 26.0 - 34.0 pg   MCHC 33.8 32.0 - 36.0 g/dL   RDW 15.6 (H) 11.5 - 14.5 %   Platelets 298 150 - 440 K/uL   Neutrophils Relative % 87 %   Lymphocytes Relative 8 %   Monocytes Relative 5 %   Eosinophils Relative 0 %   Basophils Relative 0 %   Band Neutrophils 0 %   Metamyelocytes Relative 0 %   Myelocytes 0 %   Promyelocytes Absolute 0 %   Blasts 0 %   nRBC 0 0 /100 WBC   Other 0 %   Neutro Abs 11.8 (H) 1.4 - 6.5 K/uL   Lymphs Abs 1.1 1.0 - 3.6 K/uL   Monocytes Absolute 0.7 0.2 - 0.9 K/uL   Eosinophils Absolute 0.0 0 - 0.7 K/uL   Basophils Absolute 0.0 0 - 0.1 K/uL   RBC Morphology MIXED RBC POPULATION   Blood Culture (routine x 2)     Status: None (Preliminary result)   Collection Time: 04/15/16  8:32 PM  Result Value Ref Range   Specimen Description BLOOD LEFT WRIST    Special Requests      BOTTLES DRAWN AEROBIC AND ANAEROBIC AER 7ML ANA 7ML   Culture NO GROWTH < 12 HOURS    Report Status PENDING   Blood Culture (routine x 2)     Status: None (Preliminary result)   Collection Time: 04/15/16  8:32 PM  Result Value Ref Range   Specimen Description BLOOD RIGHT WRIST    Special Requests      BOTTLES DRAWN AEROBIC AND ANAEROBIC AER 9ML ANA 10ML   Culture NO GROWTH < 12 HOURS    Report Status PENDING   Urinalysis, Complete w Microscopic     Status: Abnormal   Collection Time: 04/15/16  8:32 PM  Result Value Ref Range    Color, Urine YELLOW (A) YELLOW   APPearance TURBID (A) CLEAR   Specific Gravity, Urine 1.015 1.005 - 1.030   pH 5.0 5.0 - 8.0   Glucose, UA NEGATIVE NEGATIVE mg/dL   Hgb urine dipstick SMALL (A) NEGATIVE   Bilirubin Urine NEGATIVE NEGATIVE   Ketones, ur NEGATIVE NEGATIVE mg/dL   Protein, ur 30 (A) NEGATIVE mg/dL   Nitrite POSITIVE (A) NEGATIVE   Leukocytes, UA LARGE (A) NEGATIVE   RBC / HPF TOO NUMEROUS TO COUNT 0 - 5 RBC/hpf   WBC, UA TOO NUMEROUS TO COUNT 0 - 5 WBC/hpf   Bacteria, UA MANY (A) NONE SEEN   Squamous Epithelial / LPF 0-5 (A) NONE SEEN   WBC Clumps PRESENT    Mucous PRESENT   Basic metabolic panel     Status: Abnormal   Collection Time: 04/16/16  4:55 AM  Result Value Ref Range   Sodium 140 135 - 145 mmol/L   Potassium 3.4 (L) 3.5 - 5.1 mmol/L   Chloride 108 101 - 111 mmol/L   CO2 22 22 - 32 mmol/L   Glucose, Bld 157 (H) 65 - 99 mg/dL   BUN 22 (H) 6 - 20 mg/dL   Creatinine, Ser 1.18 (H) 0.44 - 1.00 mg/dL   Calcium 9.1 8.9 -  10.3 mg/dL   GFR calc non Af Amer 45 (L) >60 mL/min   GFR calc Af Amer 52 (L) >60 mL/min    Comment: (NOTE) The eGFR has been calculated using the CKD EPI equation. This calculation has not been validated in all clinical situations. eGFR's persistently <60 mL/min signify possible Chronic Kidney Disease.    Anion gap 10 5 - 15  CBC     Status: Abnormal   Collection Time: 04/16/16  4:55 AM  Result Value Ref Range   WBC 17.0 (H) 3.6 - 11.0 K/uL   RBC 3.70 (L) 3.80 - 5.20 MIL/uL   Hemoglobin 10.9 (L) 12.0 - 16.0 g/dL   HCT 33.9 (L) 35.0 - 47.0 %   MCV 91.7 80.0 - 100.0 fL   MCH 29.4 26.0 - 34.0 pg   MCHC 32.1 32.0 - 36.0 g/dL   RDW 15.8 (H) 11.5 - 14.5 %   Platelets 255 150 - 440 K/uL  Magnesium     Status: Abnormal   Collection Time: 04/16/16  4:55 AM  Result Value Ref Range   Magnesium 1.1 (L) 1.7 - 2.4 mg/dL  Influenza panel by PCR (type A & B)     Status: None   Collection Time: 04/16/16  5:28 AM  Result Value Ref Range    Influenza A By PCR NEGATIVE NEGATIVE   Influenza B By PCR NEGATIVE NEGATIVE    Comment: (NOTE) The Xpert Xpress Flu assay is intended as an aid in the diagnosis of  influenza and should not be used as a sole basis for treatment.  This  assay is FDA approved for nasopharyngeal swab specimens only. Nasal  washings and aspirates are unacceptable for Xpert Xpress Flu testing.   Glucose, capillary     Status: Abnormal   Collection Time: 04/16/16  7:55 AM  Result Value Ref Range   Glucose-Capillary 157 (H) 65 - 99 mg/dL  Glucose, capillary     Status: Abnormal   Collection Time: 04/16/16 11:48 AM  Result Value Ref Range   Glucose-Capillary 120 (H) 65 - 99 mg/dL   No components found for: ESR, C REACTIVE PROTEIN MICRO: Recent Results (from the past 720 hour(s))  Surgical pcr screen     Status: None   Collection Time: 03/18/16  2:48 PM  Result Value Ref Range Status   MRSA, PCR NEGATIVE NEGATIVE Final   Staphylococcus aureus NEGATIVE NEGATIVE Final    Comment:        The Xpert SA Assay (FDA approved for NASAL specimens in patients over 63 years of age), is one component of a comprehensive surveillance program.  Test performance has been validated by [facility] for patients greater than or equal to 80 year old. It is not intended to diagnose infection nor to guide or monitor treatment.   Blood Culture (routine x 2)     Status: None (Preliminary result)   Collection Time: 04/15/16  8:32 PM  Result Value Ref Range Status   Specimen Description BLOOD LEFT WRIST  Final   Special Requests   Final    BOTTLES DRAWN AEROBIC AND ANAEROBIC AER 7ML ANA 7ML   Culture NO GROWTH < 12 HOURS  Final   Report Status PENDING  Incomplete  Blood Culture (routine x 2)     Status: None (Preliminary result)   Collection Time: 04/15/16  8:32 PM  Result Value Ref Range Status   Specimen Description BLOOD RIGHT WRIST  Final   Special Requests   Final  BOTTLES DRAWN AEROBIC AND ANAEROBIC AER 9ML ANA  10ML   Culture NO GROWTH < 12 HOURS  Final   Report Status PENDING  Incomplete    IMAGING: Dg Knee 1-2 Views Right  Result Date: 03/19/2016 CLINICAL DATA:  Postop open reduction and internal fixation of patellar fracture. EXAM: DG C-ARM GT 120 MIN; RIGHT KNEE - 1-2 VIEW; LEFT ANKLE - 2 VIEW FLUOROSCOPY TIME:  1 minute and 6 seconds. C-arm fluoroscopic images were obtained intraoperatively and submitted for post operative interpretation. Please see the performing provider's procedural report for the fluoroscopy time utilized. COMPARISON:  Knee radiographs 01/12/2016. No comparison studies of the ankle. FINDINGS: Three spot fluoroscopic images of the ankle (labeled left) demonstrate plate and screw fixation of the distal fibular diaphysis for an oblique fracture. There is near anatomic reduction. No abnormality of the tibia or widening of the ankle mortise identified. Single lateral spot fluoroscopic image of the right knee demonstrates pin and cerclage wire fixation of a transverse patellar fracture. There is near anatomic reduction. Patient is status post right knee arthroplasty. IMPRESSION: Intraoperative views following distal left fibular and right patellar ORIF. No demonstrated complications. Electronically Signed   By: Richardean Sale M.D.   On: 03/19/2016 13:09   Dg Ankle 2 Views Left  Result Date: 03/19/2016 CLINICAL DATA:  Postop open reduction and internal fixation of patellar fracture. EXAM: DG C-ARM GT 120 MIN; RIGHT KNEE - 1-2 VIEW; LEFT ANKLE - 2 VIEW FLUOROSCOPY TIME:  1 minute and 6 seconds. C-arm fluoroscopic images were obtained intraoperatively and submitted for post operative interpretation. Please see the performing provider's procedural report for the fluoroscopy time utilized. COMPARISON:  Knee radiographs 01/12/2016. No comparison studies of the ankle. FINDINGS: Three spot fluoroscopic images of the ankle (labeled left) demonstrate plate and screw fixation of the distal fibular  diaphysis for an oblique fracture. There is near anatomic reduction. No abnormality of the tibia or widening of the ankle mortise identified. Single lateral spot fluoroscopic image of the right knee demonstrates pin and cerclage wire fixation of a transverse patellar fracture. There is near anatomic reduction. Patient is status post right knee arthroplasty. IMPRESSION: Intraoperative views following distal left fibular and right patellar ORIF. No demonstrated complications. Electronically Signed   By: Richardean Sale M.D.   On: 03/19/2016 13:09   Ct Head Wo Contrast  Result Date: 04/16/2016 CLINICAL DATA:  77 year old female with falls. EXAM: CT HEAD WITHOUT CONTRAST TECHNIQUE: Contiguous axial images were obtained from the base of the skull through the vertex without intravenous contrast. COMPARISON:  None. FINDINGS: Brain: There is mild age-related atrophy and chronic microvascular ischemic changes. There is no acute intracranial hemorrhage. No mass effect or midline shift noted. No extra-axial fluid collection. Vascular: No hyperdense vessel or unexpected calcification. Skull: Normal. Negative for fracture or focal lesion. Sinuses/Orbits: Minimal mucoperiosteal thickening of paranasal sinuses. No air-fluid levels. Mastoid air cells are clear. The globes are intact. Other: None IMPRESSION: No acute intracranial hemorrhage. Mild age-related atrophy and chronic microvascular ischemic changes. Electronically Signed   By: Anner Crete M.D.   On: 04/16/2016 00:13   Dg Chest Port 1 View  Result Date: 04/15/2016 CLINICAL DATA:  Mental status changes EXAM: PORTABLE CHEST 1 VIEW COMPARISON:  01/15/2016 FINDINGS: 2017 hours. Stable marked asymmetric elevation right hemidiaphragm. Basilar atelectasis again noted. The cardiopericardial silhouette is within normal limits for size. Hiatal hernia evident. The visualized bony structures of the thorax are intact. Apparent right PICC line is evident with tip in the  region of the subclavian vein. IMPRESSION: Stable. Asymmetric elevation right hemidiaphragm with bibasilar atelectasis. Hiatal hernia. Electronically Signed   By: Misty Stanley M.D.   On: 04/15/2016 20:35   Dg C-arm Gt 120 Min  Result Date: 03/19/2016 CLINICAL DATA:  Postop open reduction and internal fixation of patellar fracture. EXAM: DG C-ARM GT 120 MIN; RIGHT KNEE - 1-2 VIEW; LEFT ANKLE - 2 VIEW FLUOROSCOPY TIME:  1 minute and 6 seconds. C-arm fluoroscopic images were obtained intraoperatively and submitted for post operative interpretation. Please see the performing provider's procedural report for the fluoroscopy time utilized. COMPARISON:  Knee radiographs 01/12/2016. No comparison studies of the ankle. FINDINGS: Three spot fluoroscopic images of the ankle (labeled left) demonstrate plate and screw fixation of the distal fibular diaphysis for an oblique fracture. There is near anatomic reduction. No abnormality of the tibia or widening of the ankle mortise identified. Single lateral spot fluoroscopic image of the right knee demonstrates pin and cerclage wire fixation of a transverse patellar fracture. There is near anatomic reduction. Patient is status post right knee arthroplasty. IMPRESSION: Intraoperative views following distal left fibular and right patellar ORIF. No demonstrated complications. Electronically Signed   By: Richardean Sale M.D.   On: 03/19/2016 13:09    Assessment:   GURTHA PICKER is a 76 y.o. female with admission for AMS and fever/leukocytosis in setting of recent repeat R knee surgical repair and prolonged IV vanco and oral rifampin following a fall leading to wound dehisence and exposed wound.  Currently her flu test neg, cxr neg, UA floridly positive. PICC line has been removed. Remains febrile on Vanco and zosyn.  Recommendations Cont current abx Check esr crp Add eraxis Monitor for C diff.   Thank you very much for allowing me to participate in the care of this  patient. Please call with questions.   Cheral Marker. Ola Spurr, MD

## 2016-04-17 ENCOUNTER — Inpatient Hospital Stay
Admit: 2016-04-17 | Discharge: 2016-04-17 | Disposition: A | Discharge status: Home / Self Care | Payer: Medicare Other | Attending: Cardiovascular Disease | Admitting: Cardiovascular Disease

## 2016-04-17 ENCOUNTER — Inpatient Hospital Stay: Admit: 2016-04-17 | Payer: Medicare Other

## 2016-04-17 LAB — HEMOGLOBIN A1C
Hgb A1c MFr Bld: 5.2 % (ref 4.8–5.6)
MEAN PLASMA GLUCOSE: 103 mg/dL

## 2016-04-17 LAB — SEDIMENTATION RATE: SED RATE: 35 mm/h — AB (ref 0–30)

## 2016-04-17 LAB — CBC
HCT: 29.3 % — ABNORMAL LOW (ref 35.0–47.0)
Hemoglobin: 9.3 g/dL — ABNORMAL LOW (ref 12.0–16.0)
MCH: 28.7 pg (ref 26.0–34.0)
MCHC: 31.8 g/dL — AB (ref 32.0–36.0)
MCV: 90.3 fL (ref 80.0–100.0)
PLATELETS: 216 10*3/uL (ref 150–440)
RBC: 3.24 MIL/uL — ABNORMAL LOW (ref 3.80–5.20)
RDW: 15.5 % — AB (ref 11.5–14.5)
WBC: 14.7 10*3/uL — ABNORMAL HIGH (ref 3.6–11.0)

## 2016-04-17 LAB — BASIC METABOLIC PANEL
Anion gap: 4 — ABNORMAL LOW (ref 5–15)
BUN: 21 mg/dL — AB (ref 6–20)
CALCIUM: 8.7 mg/dL — AB (ref 8.9–10.3)
CO2: 26 mmol/L (ref 22–32)
CREATININE: 1.11 mg/dL — AB (ref 0.44–1.00)
Chloride: 110 mmol/L (ref 101–111)
GFR calc Af Amer: 56 mL/min — ABNORMAL LOW (ref >60)
GFR, EST NON AFRICAN AMERICAN: 48 mL/min — AB (ref >60)
GLUCOSE: 110 mg/dL — AB (ref 65–99)
Potassium: 2.8 mmol/L — ABNORMAL LOW (ref 3.5–5.1)
Sodium: 140 mmol/L (ref 135–145)

## 2016-04-17 LAB — URINE CULTURE

## 2016-04-17 LAB — GLUCOSE, CAPILLARY
GLUCOSE-CAPILLARY: 119 mg/dL — AB (ref 65–99)
GLUCOSE-CAPILLARY: 103 mg/dL — AB (ref 65–99)
Glucose-Capillary: 98 mg/dL (ref 65–99)
Glucose-Capillary: 150 mg/dL — ABNORMAL HIGH (ref 65–99)

## 2016-04-17 LAB — MAGNESIUM: Magnesium: 1.8 mg/dL (ref 1.7–2.4)

## 2016-04-17 LAB — C-REACTIVE PROTEIN: CRP: 13.7 mg/dL — ABNORMAL HIGH (ref <1.0)

## 2016-04-17 LAB — POTASSIUM: Potassium: 3.2 mmol/L — ABNORMAL LOW (ref 3.5–5.1)

## 2016-04-17 MED ORDER — PROMETHAZINE HCL 25 MG/ML IJ SOLN
12.5000 mg | Freq: Four times a day (QID) | INTRAMUSCULAR | Status: DC | PRN
  Administered 2016-04-17: 21:00:00 12.5 mg via INTRAVENOUS
  Filled 2016-04-17 (×2): qty 1

## 2016-04-17 MED ORDER — NORTRIPTYLINE HCL 25 MG PO CAPS
75.0000 mg | ORAL_CAPSULE | Freq: Every day | ORAL | Status: DC
  Administered 2016-04-17 – 2016-04-19 (×3): 75 mg via ORAL
  Filled 2016-04-17 (×6): qty 3

## 2016-04-17 MED ORDER — POTASSIUM CHLORIDE 20 MEQ PO PACK
20.0000 meq | PACK | Freq: Once | ORAL | Status: AC
  Administered 2016-04-17: 20 meq via ORAL
  Filled 2016-04-17: qty 1

## 2016-04-17 MED ORDER — LORAZEPAM 2 MG/ML IJ SOLN
0.5000 mg | Freq: Once | INTRAMUSCULAR | Status: AC
Start: 2016-04-17 — End: 2016-04-17
  Administered 2016-04-17: 0.5 mg via INTRAVENOUS
  Filled 2016-04-17: qty 1

## 2016-04-17 MED ORDER — SODIUM CHLORIDE 0.9 % IV SOLN
30.0000 meq | Freq: Once | INTRAVENOUS | Status: AC
  Administered 2016-04-17: 30 meq via INTRAVENOUS
  Filled 2016-04-17: qty 15

## 2016-04-17 MED ORDER — ONDANSETRON HCL 4 MG/2ML IJ SOLN
4.0000 mg | Freq: Four times a day (QID) | INTRAMUSCULAR | Status: DC | PRN
  Administered 2016-04-17 – 2016-04-19 (×2): 4 mg via INTRAVENOUS
  Filled 2016-04-17 (×2): qty 2

## 2016-04-17 MED ORDER — LEVOTHYROXINE SODIUM 50 MCG PO TABS
50.0000 ug | ORAL_TABLET | Freq: Every day | ORAL | Status: DC
  Administered 2016-04-18 – 2016-04-21 (×4): 50 ug via ORAL
  Filled 2016-04-17 (×5): qty 1

## 2016-04-17 NOTE — Progress Notes (Signed)
*  PRELIMINARY RESULTS* Echocardiogram 2D Echocardiogram has been performed.  Leslie Weber Leslie Weber 04/17/2016, 11:25 AM

## 2016-04-17 NOTE — Progress Notes (Signed)
[facility] - [place] [facility]   PATIENT NAME: Leslie Weber    MR#:  [id number]  DATE OF BIRTH:  08-19-1940  SUBJECTIVE:   Patient awake and alert this morning. No fevers overnight  REVIEW OF SYSTEMS:    Review of Systems  Constitutional: Positive for malaise/fatigue. Negative for chills and fever.  HENT: Negative.  Negative for ear discharge, ear pain, hearing loss, nosebleeds and sore throat.   Eyes: Negative.  Negative for blurred vision and pain.  Respiratory: Negative.  Negative for cough, hemoptysis, shortness of breath and wheezing.   Cardiovascular: Negative.  Negative for chest pain, palpitations and leg swelling.  Gastrointestinal: Negative.  Negative for abdominal pain, blood in stool, diarrhea, nausea and vomiting.  Genitourinary: Negative.  Negative for dysuria.  Musculoskeletal: Positive for falls and joint pain. Negative for back pain.  Skin: Negative.   Neurological: Positive for weakness. Negative for dizziness, tremors, speech change, focal weakness, seizures and headaches.  Endo/Heme/Allergies: Negative.  Does not bruise/bleed easily.  Psychiatric/Behavioral: Negative.  Negative for depression, hallucinations and suicidal ideas.    Tolerating Diet: yes      DRUG ALLERGIES:   Allergies  Allergen Reactions  . Oxycodone Other (See Comments)    "stopped breathing"    VITALS:  Blood pressure 130/72, pulse 98, temperature 97.8 F (36.6 C), temperature source Oral, resp. rate 20, height 5\' 4"  (1.626 m), weight 97.8 kg (215 lb 9 oz), SpO2 92 %.  PHYSICAL EXAMINATION:   Physical Exam    LABORATORY PANEL:   CBC  Recent Labs Lab 04/17/16 0613  WBC 14.7*  HGB 9.3*  HCT 29.3*  PLT 216   ------------------------------------------------------------------------------------------------------------------  Chemistries   Recent Labs Lab 04/15/16 2032  04/17/16 0613  NA 140  < > 140  K 3.8  < > 2.8*  CL 103  < > 110  CO2 28   < > 26  GLUCOSE 148*  < > 110*  BUN 26*  < > 21*  CREATININE 1.24*  < > 1.11*  CALCIUM 9.9  < > 8.7*  MG  --   < > 1.8  AST 14*  --   --   ALT 9*  --   --   ALKPHOS 84  --   --   BILITOT 0.4  --   --   < > = values in this interval not displayed. ------------------------------------------------------------------------------------------------------------------  Cardiac Enzymes  Recent Labs Lab 04/15/16 2032  TROPONINI 0.03*   ------------------------------------------------------------------------------------------------------------------  RADIOLOGY:  Ct Head Wo Contrast  Result Date: 04/16/2016 CLINICAL DATA:  76 year old female with falls. EXAM: CT HEAD WITHOUT CONTRAST TECHNIQUE: Contiguous axial images were obtained from the base of the skull through the vertex without intravenous contrast. COMPARISON:  None. FINDINGS: Brain: There is mild age-related atrophy and chronic microvascular ischemic changes. There is no acute intracranial hemorrhage. No mass effect or midline shift noted. No extra-axial fluid collection. Vascular: No hyperdense vessel or unexpected calcification. Skull: Normal. Negative for fracture or focal lesion. Sinuses/Orbits: Minimal mucoperiosteal thickening of paranasal sinuses. No air-fluid levels. Mastoid air cells are clear. The globes are intact. Other: None IMPRESSION: No acute intracranial hemorrhage. Mild age-related atrophy and chronic microvascular ischemic changes. Electronically Signed   By: Elgie CollardArash  Radparvar M.D.   On: 04/16/2016 00:13   Dg Chest Port 1 View  Result Date: 04/15/2016 CLINICAL DATA:  Mental status changes EXAM: PORTABLE CHEST 1 VIEW COMPARISON:  01/15/2016 FINDINGS: 2017 hours. Stable marked asymmetric elevation right hemidiaphragm. Basilar atelectasis again  noted. The cardiopericardial silhouette is within normal limits for size. Hiatal hernia evident. The visualized bony structures of the thorax are intact. Apparent right PICC line is  evident with tip in the region of the subclavian vein. IMPRESSION: Stable. Asymmetric elevation right hemidiaphragm with bibasilar atelectasis. Hiatal hernia. Electronically Signed   By: Kennith Center M.D.   On: 04/15/2016 20:35     ASSESSMENT AND PLAN:    76 year old female with a history of CAD, multiple UTIs and recent knee replacement with PICC line who presents with sepsis.  1. Sepsis: Patient presents with fever, tachycardia leukocytosis Patient has urinary tract infection but may also have bloodstream infection from PICC line, not felt to be from an infection from knee replacement. Appreciate ID and ORTHO consult. Continue Eraxis, Vancomycin, Meropenem Follow up blood cultures and tip of PICC line culture.  2. Urinary tract infection: U cx suggests recollection.  3. Hypothyroidism: Continue to  Synthroid  4. History of CAD: Restart home medications. 5.. Diabetes:Continue Sliding scale A1C 5.2 7. Nonsustained V. tach: Due to electrolyte abnormalities Keep K>4 and magnesium +2  Pharmacy assisting with replacement.     Management plans discussed with the patient and she is in agreement.  CODE STATUS: FULL  TOTAL TIME TAKING CARE OF THIS PATIENT: 29 minutes.     POSSIBLE D/C 2 days, DEPENDING ON CLINICAL CONDITION.   Aza Dantes M.D on 04/17/2016 at 10:23 AM  Between 7am to 6pm - Pager - [phone] After 6pm go to [email or web address] - password [email or web address]  [facility]  Office  [phone]  CC: Primary care physician; Rafael Bihari, MD  Note: This dictation was prepared with Dragon dictation along with smaller phrase technology. Any transcriptional errors that result from this process are unintentional.

## 2016-04-17 NOTE — Progress Notes (Signed)
Subjective: Patient much more alert and responsive this morning. Appears comfortable. Has no new complaints.   Objective: Vital signs in last 24 hours: Temp:  [97.8 F (36.6 C)-102.5 F (39.2 C)] 97.8 F (36.6 C) (01/27 0451) Pulse Rate:  [95-101] 98 (01/27 0451) Resp:  [20] 20 (01/27 0451) BP: (130-152)/(62-72) 130/72 (01/27 0451) SpO2:  [92 %-100 %] 92 % (01/27 0451)  Intake/Output from previous day: 01/26 0701 - 01/27 0700 In: 1711.3 [P.O.:300; I.V.:911.3; IV Piggyback:500] Out: -  Intake/Output this shift: No intake/output data recorded.   Recent Labs  04/15/16 2032 04/16/16 0455 04/17/16 0613  HGB 10.7* 10.9* 9.3*    Recent Labs  04/16/16 0455 04/17/16 0613  WBC 17.0* 14.7*  RBC 3.70* 3.24*  HCT 33.9* 29.3*  PLT 255 216    Recent Labs  04/16/16 0455 04/17/16 0613 04/17/16 1151  NA 140 140  --   K 3.4* 2.8* 3.2*  CL 108 110  --   CO2 22 26  --   BUN 22* 21*  --   CREATININE 1.18* 1.11*  --   GLUCOSE 157* 110*  --   CALCIUM 9.1 8.7*  --    No results for input(s): LABPT, INR in the last 72 hours.  Physical Exam: Examination of the right knee demonstrates her surgical incision to be well-healed and without evidence for infection. There is no swelling, erythema, warmth, or other abnormalities concerning for infection. There is no significant knee effusion. She is neurovascular intact to the right lower extremity and foot.  Assessment: Status post ORIF of a transverse fracture right patella status post prior TKA.  Plan: Continue present antibiotic regimen. Continue to use a hinged knee brace locked in extension at all times. May begin to mobilize with physical therapy weightbearing as tolerated in the brace hinged knee brace locked in extension when she is stable medically.   Excell SeltzerJohn J Braian Tijerina 04/17/2016, 1:40 PM

## 2016-04-17 NOTE — Progress Notes (Signed)
Electrolyte replacement  Pharmacy consulted for electrolyte replacement in this 76 y/o F.  1/27 AM K+ 2.8, Mg 1.8.    Ordered KCl 30 mEq IV x1.  Will recheck level today at 12:00.  Recheck BMP and Mg with tomorrow AM labs.  Stormy CardKatsoudas,Demarques Pilz K, [facility]CRPH Clinical Pharmacist  04/17/2016, 7:21 AM

## 2016-04-17 NOTE — Progress Notes (Signed)
Electrolyte replacement  Pharmacy consulted for electrolyte replacement in this 76 y/o F.  1/27 AM K+ 2.8, Mg 1.8. Ordered KCl 30 mEq IV x1.  Will recheck level today at 12:00.  1/27 ~12:00 K = 3.2 Ordered KCl 20meq PO x 1 dose.    Recheck BMP and Mg with tomorrow AM labs.  Stormy CardKatsoudas,Margarette Vannatter K, [facility]PH Clinical Pharmacist  04/17/2016, 12:51 PM

## 2016-04-17 NOTE — Progress Notes (Signed)
Patient had been afebrile. Complained of feeling cold, cooling blanket turned off.  Latest body temperature as of 0451 was 97.8 F.  Patient kept safe and comfortable.

## 2016-04-17 NOTE — Progress Notes (Signed)
Leslie Weber is a 76 y.o. female  [id number]  Primary Cardiologist: Adrian Blackwater Reason for Consultation: Elevated troponin  HPI: This is a 75 year old white female with a past medical history of multiple medical problems including coronary artery disease hypertension presented to the hospital with confusion but right now is more alert. She was found to have urosepsis and elevated white count as well as electrolyte disorder. I was asked to evaluate the patient because of mildly elevated troponin.   Review of Systems: No chest pain or shortness of breath   Past Medical History:  Diagnosis Date  . Arthritis   . Coronary artery disease    a. 11/2014 CT chest w/ coronary atherosclerosis.  . Depression   . Diabetes mellitus without complication (HCC)   . Fever due to malaria    76 years old  . Fibromyalgia   . GERD (gastroesophageal reflux disease)   . History of hiatal hernia   . Hypertension   . Hypothyroidism   . Neuromuscular disorder (HCC)    Bells palsy left side of face  . Neuropathy of both feet   . Osteoarthritis    a. s/p R TKA 12/2015.  Marland Kitchen Pneumonia 1997   history of walking pneumonia  . Sleep apnea    not yet had sleep study    Medications Prior to Admission  Medication Sig Dispense Refill  . acetaminophen (TYLENOL) 500 MG tablet Take 500 mg by mouth every 6 (six) hours.    Marland Kitchen atorvastatin (LIPITOR) 40 MG tablet Take 40 mg by mouth every morning.    . Calcium Carb-Cholecalciferol (CALCIUM 600+D) 600-800 MG-UNIT TABS Take 1 Dose by mouth 2 times daily at 12 noon and 4 pm.    . celecoxib (CELEBREX) 200 MG capsule Take 200 mg by mouth 2 (two) times daily.    . citalopram (CELEXA) 20 MG tablet Take 20 mg by mouth every morning.    . ertapenem 1 g in sodium chloride 0.9 % 50 mL Inject 1 g into the vein daily.    . ferrous sulfate 325 (65 FE) MG tablet Take 325 mg by mouth daily with breakfast.    . gabapentin (NEURONTIN) 600 MG tablet Take 600 mg by mouth 3 (three)  times daily.    Marland Kitchen levothyroxine (SYNTHROID, LEVOTHROID) 50 MCG tablet Take 50 mcg by mouth daily before breakfast.    . liraglutide (VICTOZA) 18 MG/3ML SOPN Inject 0.6 mg into the skin every morning.    . Multiple Vitamins-Minerals (CENTRUM WOMEN PO) Take 1 tablet by mouth every morning.    . nortriptyline (PAMELOR) 75 MG capsule Take 75 mg by mouth at bedtime.    . pantoprazole (PROTONIX) 40 MG tablet Take 40 mg by mouth daily before breakfast.     . traMADol (ULTRAM) 50 MG tablet Take 1 tablet (50 mg total) by mouth every 6 (six) hours as needed for moderate pain. 16 tablet 1     . anidulafungin  200 mg Intravenous Q24H  . enoxaparin (LOVENOX) injection  40 mg Subcutaneous Q24H  . insulin aspart  0-5 Units Subcutaneous QHS  . insulin aspart  0-9 Units Subcutaneous TID WC  . levothyroxine  25 mcg Intravenous Daily  . meropenem  1 g Intravenous Q12H  . potassium chloride (KCL MULTIRUN) 30 mEq in 265 mL IVPB  30 mEq Intravenous Once  . sodium chloride flush  3 mL Intravenous Q12H  . vancomycin  1,000 mg Intravenous Q18H    Infusions: . sodium chloride  75 mL/hr at 04/17/16 0520    Allergies  Allergen Reactions  . Oxycodone Other (See Comments)    "stopped breathing"    Social History   Social History  . Marital status: Married    Spouse name: N/A  . Number of children: N/A  . Years of education: N/A   Occupational History  . Not on file.   Social History Main Topics  . Smoking status: Never Smoker  . Smokeless tobacco: Never Used  . Alcohol use No  . Drug use: No  . Sexual activity: Not on file   Other Topics Concern  . Not on file   Social History Narrative   Lives at home with husband. Uses a walker at baseline.    Family History  Problem Relation Age of Onset  . Liver cancer Mother   . Lung cancer Father     PHYSICAL EXAM: Vitals:   04/17/16 0344 04/17/16 0451  BP: (!) 149/67 130/72  Pulse: 95 98  Resp: 20 20  Temp: 98.7 F (37.1 C) 97.8 F (36.6  C)     Intake/Output Summary (Last 24 hours) at 04/17/16 0935 Last data filed at 04/17/16 0521  Gross per 24 hour  Intake          1711.25 ml  Output                0 ml  Net          1711.25 ml    General:  Well appearing. No respiratory difficulty HEENT: normal Neck: supple. no JVD. Carotids 2+ bilat; no bruits. No lymphadenopathy or thryomegaly appreciated. Cor: PMI nondisplaced. Regular rate & rhythm. No rubs, gallops or murmurs. Lungs: clear Abdomen: soft, nontender, nondistended. No hepatosplenomegaly. No bruits or masses. Good bowel sounds. Extremities: no cyanosis, clubbing, rash, edema Neuro: alert & oriented x 3, cranial nerves grossly intact. moves all 4 extremities w/o difficulty. Affect pleasant.  ECG: Sinus rhythm with nonspecific ST-T changes  Results for orders placed or performed during the hospital encounter of 04/15/16 (from the past 24 hour(s))  Glucose, capillary     Status: Abnormal   Collection Time: 04/16/16 11:48 AM  Result Value Ref Range   Glucose-Capillary 120 (H) 65 - 99 mg/dL  Glucose, capillary     Status: Abnormal   Collection Time: 04/16/16  4:53 PM  Result Value Ref Range   Glucose-Capillary 121 (H) 65 - 99 mg/dL  Glucose, capillary     Status: Abnormal   Collection Time: 04/16/16  9:05 PM  Result Value Ref Range   Glucose-Capillary 105 (H) 65 - 99 mg/dL  Basic metabolic panel     Status: Abnormal   Collection Time: 04/17/16  6:13 AM  Result Value Ref Range   Sodium 140 135 - 145 mmol/L   Potassium 2.8 (L) 3.5 - 5.1 mmol/L   Chloride 110 101 - 111 mmol/L   CO2 26 22 - 32 mmol/L   Glucose, Bld 110 (H) 65 - 99 mg/dL   BUN 21 (H) 6 - 20 mg/dL   Creatinine, Ser 1.61 (H) 0.44 - 1.00 mg/dL   Calcium 8.7 (L) 8.9 - 10.3 mg/dL   GFR calc non Af Amer 48 (L) >60 mL/min   GFR calc Af Amer 56 (L) >60 mL/min   Anion gap 4 (L) 5 - 15  Magnesium     Status: None   Collection Time: 04/17/16  6:13 AM  Result Value Ref Range   Magnesium 1.8 1.7 -  2.4 mg/dL  CBC     Status: Abnormal   Collection Time: 04/17/16  6:13 AM  Result Value Ref Range   WBC 14.7 (H) 3.6 - 11.0 K/uL   RBC 3.24 (L) 3.80 - 5.20 MIL/uL   Hemoglobin 9.3 (L) 12.0 - 16.0 g/dL   HCT 16.129.3 (L) 09.635.0 - 04.547.0 %   MCV 90.3 80.0 - 100.0 fL   MCH 28.7 26.0 - 34.0 pg   MCHC 31.8 (L) 32.0 - 36.0 g/dL   RDW 40.915.5 (H) 81.111.5 - 91.414.5 %   Platelets 216 150 - 440 K/uL  Sedimentation rate     Status: Abnormal   Collection Time: 04/17/16  6:13 AM  Result Value Ref Range   Sed Rate 35 (H) 0 - 30 mm/hr  Glucose, capillary     Status: None   Collection Time: 04/17/16  7:24 AM  Result Value Ref Range   Glucose-Capillary 98 65 - 99 mg/dL   Ct Head Wo Contrast  Result Date: 04/16/2016 CLINICAL DATA:  76 year old female with falls. EXAM: CT HEAD WITHOUT CONTRAST TECHNIQUE: Contiguous axial images were obtained from the base of the skull through the vertex without intravenous contrast. COMPARISON:  None. FINDINGS: Brain: There is mild age-related atrophy and chronic microvascular ischemic changes. There is no acute intracranial hemorrhage. No mass effect or midline shift noted. No extra-axial fluid collection. Vascular: No hyperdense vessel or unexpected calcification. Skull: Normal. Negative for fracture or focal lesion. Sinuses/Orbits: Minimal mucoperiosteal thickening of paranasal sinuses. No air-fluid levels. Mastoid air cells are clear. The globes are intact. Other: None IMPRESSION: No acute intracranial hemorrhage. Mild age-related atrophy and chronic microvascular ischemic changes. Electronically Signed   By: Elgie CollardArash  Radparvar M.D.   On: 04/16/2016 00:13   Dg Chest Port 1 View  Result Date: 04/15/2016 CLINICAL DATA:  Mental status changes EXAM: PORTABLE CHEST 1 VIEW COMPARISON:  01/15/2016 FINDINGS: 2017 hours. Stable marked asymmetric elevation right hemidiaphragm. Basilar atelectasis again noted. The cardiopericardial silhouette is within normal limits for size. Hiatal hernia evident.  The visualized bony structures of the thorax are intact. Apparent right PICC line is evident with tip in the region of the subclavian vein. IMPRESSION: Stable. Asymmetric elevation right hemidiaphragm with bibasilar atelectasis. Hiatal hernia. Electronically Signed   By: Kennith CenterEric  Mansell M.D.   On: 04/15/2016 20:35     ASSESSMENT AND PLAN: Mildly elevated troponin with history of coronary artery disease in the setting of urosepsis probably due to demand ischemia and will get echocardiogram to evaluate wall motion. Advise conservative treatment.  Yariel Ferraris A

## 2016-04-17 NOTE — Clinical Social Work Note (Signed)
CSW is aware that patient is from a facility due to a chart review. CSW will assess when able and is following.  Argentina PonderKaren Martha Zahniya Zellars, MSW, Theresia MajorsLCSWA [phone]

## 2016-04-18 ENCOUNTER — Inpatient Hospital Stay: Payer: Medicare Other

## 2016-04-18 DIAGNOSIS — F05 Delirium due to known physiological condition: Secondary | ICD-10-CM

## 2016-04-18 DIAGNOSIS — F0281 Dementia in other diseases classified elsewhere with behavioral disturbance: Secondary | ICD-10-CM

## 2016-04-18 DIAGNOSIS — F02B18 Dementia in other diseases classified elsewhere, moderate, with other behavioral disturbance: Secondary | ICD-10-CM

## 2016-04-18 LAB — CBC
HEMATOCRIT: 29.8 % — AB (ref 35.0–47.0)
Hemoglobin: 9.7 g/dL — ABNORMAL LOW (ref 12.0–16.0)
MCH: 28.9 pg (ref 26.0–34.0)
MCHC: 32.5 g/dL (ref 32.0–36.0)
MCV: 89 fL (ref 80.0–100.0)
PLATELETS: 217 10*3/uL (ref 150–440)
RBC: 3.34 MIL/uL — AB (ref 3.80–5.20)
RDW: 15.4 % — AB (ref 11.5–14.5)
WBC: 12.6 10*3/uL — AB (ref 3.6–11.0)

## 2016-04-18 LAB — MAGNESIUM: Magnesium: 1.4 mg/dL — ABNORMAL LOW (ref 1.7–2.4)

## 2016-04-18 LAB — BASIC METABOLIC PANEL
ANION GAP: 8 (ref 5–15)
BUN: 17 mg/dL (ref 6–20)
CO2: 24 mmol/L (ref 22–32)
CREATININE: 0.71 mg/dL (ref 0.44–1.00)
Calcium: 8.6 mg/dL — ABNORMAL LOW (ref 8.9–10.3)
Chloride: 109 mmol/L (ref 101–111)
GFR calc Af Amer: >60 mL/min (ref >60)
Glucose, Bld: 139 mg/dL — ABNORMAL HIGH (ref 65–99)
Potassium: 3.2 mmol/L — ABNORMAL LOW (ref 3.5–5.1)
Sodium: 141 mmol/L (ref 135–145)

## 2016-04-18 LAB — GLUCOSE, CAPILLARY
GLUCOSE-CAPILLARY: 130 mg/dL — AB (ref 65–99)
Glucose-Capillary: 108 mg/dL — ABNORMAL HIGH (ref 65–99)
Glucose-Capillary: 108 mg/dL — ABNORMAL HIGH (ref 65–99)
Glucose-Capillary: 118 mg/dL — ABNORMAL HIGH (ref 65–99)

## 2016-04-18 LAB — RAPID HIV SCREEN (HIV 1/2 AB+AG)
HIV 1/2 ANTIBODIES: NONREACTIVE
HIV-1 P24 ANTIGEN - HIV24: NONREACTIVE

## 2016-04-18 LAB — ECHOCARDIOGRAM COMPLETE
Height: 64 in
WEIGHTICAEL: 3449 [oz_av]

## 2016-04-18 LAB — VITAMIN B12: Vitamin B-12: 360 pg/mL (ref 180–914)

## 2016-04-18 LAB — AMMONIA: AMMONIA: 18 umol/L (ref 9–35)

## 2016-04-18 LAB — TSH: TSH: 6.7 u[IU]/mL — AB (ref 0.350–4.500)

## 2016-04-18 LAB — POTASSIUM: Potassium: 3.1 mmol/L — ABNORMAL LOW (ref 3.5–5.1)

## 2016-04-18 LAB — VANCOMYCIN, TROUGH: VANCOMYCIN TR: 18 ug/mL (ref 15–20)

## 2016-04-18 MED ORDER — HALOPERIDOL 1 MG PO TABS
0.5000 mg | ORAL_TABLET | Freq: Three times a day (TID) | ORAL | Status: DC
  Administered 2016-04-18 – 2016-04-20 (×6): 0.5 mg via ORAL
  Filled 2016-04-18 (×6): qty 1

## 2016-04-18 MED ORDER — POTASSIUM CHLORIDE 20 MEQ PO PACK
40.0000 meq | PACK | Freq: Once | ORAL | Status: AC
  Administered 2016-04-18: 21:00:00 40 meq via ORAL
  Filled 2016-04-18: qty 2

## 2016-04-18 MED ORDER — HALOPERIDOL LACTATE 5 MG/ML IJ SOLN
5.0000 mg | Freq: Once | INTRAMUSCULAR | Status: AC
  Administered 2016-04-18: 04:00:00 5 mg via INTRAVENOUS
  Filled 2016-04-18: qty 1

## 2016-04-18 MED ORDER — POTASSIUM CHLORIDE CRYS ER 20 MEQ PO TBCR
20.0000 meq | EXTENDED_RELEASE_TABLET | ORAL | Status: AC
  Administered 2016-04-18: 20 meq via ORAL
  Filled 2016-04-18 (×2): qty 1

## 2016-04-18 MED ORDER — TRAMADOL HCL 50 MG PO TABS
50.0000 mg | ORAL_TABLET | Freq: Four times a day (QID) | ORAL | Status: DC | PRN
  Administered 2016-04-18 – 2016-04-20 (×4): 50 mg via ORAL
  Filled 2016-04-18 (×5): qty 1

## 2016-04-18 MED ORDER — MEROPENEM-SODIUM CHLORIDE 1 GM/50ML IV SOLR
1.0000 g | Freq: Three times a day (TID) | INTRAVENOUS | Status: DC
  Administered 2016-04-18 – 2016-04-19 (×4): 1 g via INTRAVENOUS
  Filled 2016-04-18 (×6): qty 50

## 2016-04-18 MED ORDER — MAGNESIUM SULFATE 4 GM/100ML IV SOLN
4.0000 g | Freq: Once | INTRAVENOUS | Status: AC
  Administered 2016-04-18: 09:00:00 4 g via INTRAVENOUS
  Filled 2016-04-18: qty 100

## 2016-04-18 MED ORDER — MELATONIN 5 MG PO TABS
10.0000 mg | ORAL_TABLET | Freq: Every day | ORAL | Status: DC
  Administered 2016-04-18 – 2016-04-19 (×2): 10 mg via ORAL
  Filled 2016-04-18 (×4): qty 2

## 2016-04-18 MED ORDER — CITALOPRAM HYDROBROMIDE 20 MG PO TABS
20.0000 mg | ORAL_TABLET | ORAL | Status: DC
  Administered 2016-04-18 – 2016-04-21 (×4): 20 mg via ORAL
  Filled 2016-04-18 (×5): qty 1

## 2016-04-18 MED ORDER — ATORVASTATIN CALCIUM 20 MG PO TABS
40.0000 mg | ORAL_TABLET | ORAL | Status: DC
  Administered 2016-04-18 – 2016-04-21 (×4): 40 mg via ORAL
  Filled 2016-04-18 (×5): qty 2

## 2016-04-18 MED ORDER — LEVOTHYROXINE SODIUM 50 MCG PO TABS: 50.0000 ug | ORAL_TABLET | Freq: Every day | ORAL | Status: DC

## 2016-04-18 MED ORDER — GABAPENTIN 600 MG PO TABS
600.0000 mg | ORAL_TABLET | Freq: Three times a day (TID) | ORAL | Status: DC
  Administered 2016-04-18: 10:00:00 600 mg via ORAL
  Filled 2016-04-18 (×2): qty 1

## 2016-04-18 NOTE — Progress Notes (Signed)
[employer] - [place] [facility]   PATIENT NAME: Leslie Weber    MR#:  [id number]  DATE OF BIRTH:  1940-06-04  SUBJECTIVE:   Patient is confused this morning. She will threw water at the nurse. She is trying to get out of bed. She was given Haldol last night for agitation.  REVIEW OF SYSTEMS:    Review of Systems  Unable to perform ROS: Other   patient confused  Tolerating Diet: yes      DRUG ALLERGIES:   Allergies  Allergen Reactions  . Oxycodone Other (See Comments)    "stopped breathing"    VITALS:  Blood pressure (!) 153/93, pulse (!) 104, temperature 97.5 F (36.4 C), temperature source Oral, resp. rate 20, height 5\' 4"  (1.626 m), weight 97.8 kg (215 lb 9 oz), SpO2 90 %.  PHYSICAL EXAMINATION:   Physical Exam  Constitutional: She is well-developed, well-nourished, and in no distress. No distress.  HENT:  Head: Normocephalic.  Eyes: No scleral icterus.  Neck: Normal range of motion. Neck supple. No JVD present. No tracheal deviation present.  Cardiovascular: Normal rate, regular rhythm and normal heart sounds.  Exam reveals no gallop and no friction rub.   No murmur heard. Pulmonary/Chest: Effort normal and breath sounds normal. No respiratory distress. She has no wheezes. She has no rales. She exhibits no tenderness.  Abdominal: Soft. Bowel sounds are normal. She exhibits no distension and no mass. There is no tenderness. There is no rebound and no guarding.  Musculoskeletal: She exhibits no edema or tenderness.  She has a bruit on the left leg in a brace on the right leg  Neurological: She is alert.  Oriented to place and name  Skin: Skin is warm. No rash noted. No erythema.  Right knee scar is well-healed      LABORATORY PANEL:   CBC  Recent Labs Lab 04/18/16 0503  WBC 12.6*  HGB 9.7*  HCT 29.8*  PLT 217    ------------------------------------------------------------------------------------------------------------------  Chemistries   Recent Labs Lab 04/15/16 2032  04/18/16 0503  NA 140  < > 141  K 3.8  < > 3.2*  CL 103  < > 109  CO2 28  < > 24  GLUCOSE 148*  < > 139*  BUN 26*  < > 17  CREATININE 1.24*  < > 0.71  CALCIUM 9.9  < > 8.6*  MG  --   < > 1.4*  AST 14*  --   --   ALT 9*  --   --   ALKPHOS 84  --   --   BILITOT 0.4  --   --   < > = values in this interval not displayed. ------------------------------------------------------------------------------------------------------------------  Cardiac Enzymes  Recent Labs Lab 04/15/16 2032  TROPONINI 0.03*   ------------------------------------------------------------------------------------------------------------------  RADIOLOGY:  No results found.   ASSESSMENT AND PLAN:   76 year old female with a history of CAD,multiple UTIs and recent knee replacement with PICC line who presents with sepsis.  1. Sepsis: Patient presented with fever, tachycardia leukocytosis Patient has urinary tract infection but may also have bloodstream infection from PICC line, not felt to be from an infection from knee replacement as per ortho consult. Appreciate ID and ORTHO consult. Continue Eraxis, Vancomycin, Meropenem for today, if blood cultures remain negative then would likely discontinue after discussing with ID.  Follow up final blood cultures and tip of PICC line culture.  2. Urinary tract infection: U cx suggests recollection, however patient has been on  antibiotics..  3. Hypothyroidism: Continue Synthroid  4. History of CAD: Atorvastatin 5.. Diabetes:Continue Sliding scale A1C 5.2 6. Nonsustained V. tach: Due to electrolyte abnormalities Keep K>4 and magnesium +2  Pharmacy assisting with replacement.  7. Acute encephalopathy due to hospital delirium with probable cognitive impairment at baseline Continue  telemetry sitter Psych consult for assistance with medications.  8. Hypokalemia and hypo-magnesium: Replacement as per pharmacy consultation.    Management plans discussed with the patient's daughter yesterday  and she is in agreement. I will call daughter later today  CODE STATUS: FULL  TOTAL TIME TAKING CARE OF THIS PATIENT: 25 minutes.    POSSIBLE D/C 2 days, DEPENDING ON CLINICAL CONDITION.   Leslie Weber M.D on 04/18/2016 at 9:48 AM  Between 7am to 6pm - Pager - [phone] After 6pm go to [email or web address] - password [email or web address]  [facility]  Office  [phone]  CC: Primary care physician; Rafael Bihari, MD  Note: This dictation was prepared with Dragon dictation along with smaller phrase technology. Any transcriptional errors that result from this process are unintentional.

## 2016-04-18 NOTE — Progress Notes (Signed)
Electrolyte replacement  Pharmacy consulted for electrolyte replacement in this 76 y/o F.  1/28  05:03:  Potassium = 3.2, Mag = 1.4.  Ordered KCl 20meq PO Q2H x 1 doses.  Recheck K tonight at 18:00. Ordered Mag 4g IV x 1.    Recheck BMP and Mag with tomorrow AM labs.  Stormy CardKatsoudas,Manu Rubey K, [facility]PH Clinical Pharmacist  04/18/2016, 08:48 AM

## 2016-04-18 NOTE — Progress Notes (Signed)
Subjective: Patient more confused this morning, but awake and responsive. No new complaints pertaining to her right knee.   Objective: Vital signs in last 24 hours: Temp:  [97.5 F (36.4 C)-98.2 F (36.8 C)] 97.5 F (36.4 C) (01/28 0448) Pulse Rate:  [90-104] 104 (01/28 0448) Resp:  [18-20] 20 (01/28 0448) BP: (148-162)/(83-97) 153/93 (01/28 0448) SpO2:  [90 %-98 %] 90 % (01/28 0448)  Intake/Output from previous day: 01/27 0701 - 01/28 0700 In: 2453.8 [P.O.:480; I.V.:1773.8; IV Piggyback:200] Out: -  Intake/Output this shift: No intake/output data recorded.   Recent Labs  04/15/16 2032 04/16/16 0455 04/17/16 0613 04/18/16 0503  HGB 10.7* 10.9* 9.3* 9.7*    Recent Labs  04/17/16 0613 04/18/16 0503  WBC 14.7* 12.6*  RBC 3.24* 3.34*  HCT 29.3* 29.8*  PLT 216 217    Recent Labs  04/17/16 0613 04/17/16 1151 04/18/16 0503  NA 140  --  141  K 2.8* 3.2* 3.2*  CL 110  --  109  CO2 26  --  24  BUN 21*  --  17  CREATININE 1.11*  --  0.71  GLUCOSE 110*  --  139*  CALCIUM 8.7*  --  8.6*   No results for input(s): LABPT, INR in the last 72 hours.  Physical Exam: Examination of the right knee demonstrates her surgical incision to be well-healed without evidence for infection. There is no surrounding erythema or any active drainage. There is no swelling or effusion noted. She remains neurovascular intact to the right lower extremity and foot.  Assessment: Status post ORIF of right patella fracture following prior total knee arthroplasty.  Plan: Continue appropriate IV antibiotics and switched to po antibiotics as recommended by the infectious disease consultant. May mobilize with physical therapy as medical condition permits. She may weight-bear as tolerated on the right lower extremity, but needs to remain in her hinged knee brace with the brace locked in extension.  I will sign off at this time. She is to follow up with Dr. Ernest Weber in his office as scheduled. If you  need further orthopedic input, please contact either me or Dr. Ernest Weber.   Excell SeltzerJohn J Kinslee Weber 04/18/2016, 8:25 AM

## 2016-04-18 NOTE — Progress Notes (Signed)
SUBJECTIVE: Patient is sleeping but is comfortable F2   Vitals:   04/17/16 0451 04/17/16 1448 04/17/16 1957 04/18/16 0448  BP: 130/72 (!) 162/83 (!) 148/97 (!) 153/93  Pulse: 98 95 90 (!) 104  Resp: 20 18 20 20   Temp: 97.8 F (36.6 C) 98.2 F (36.8 C) 98.2 F (36.8 C) 97.5 F (36.4 C)  TempSrc: Oral Oral Oral Oral  SpO2: 92% 98% 98% 90%  Weight:      Height:        Intake/Output Summary (Last 24 hours) at 04/18/16 1200 Last data filed at 04/18/16 0930  Gross per 24 hour  Intake          2333.75 ml  Output                0 ml  Net          2333.75 ml    LABS: Basic Metabolic Panel:  Recent Labs  16/10/96 0613 04/17/16 1151 04/18/16 0503  NA 140  --  141  K 2.8* 3.2* 3.2*  CL 110  --  109  CO2 26  --  24  GLUCOSE 110*  --  139*  BUN 21*  --  17  CREATININE 1.11*  --  0.71  CALCIUM 8.7*  --  8.6*  MG 1.8  --  1.4*   Liver Function Tests:  Recent Labs  04/15/16 2032  AST 14*  ALT 9*  ALKPHOS 84  BILITOT 0.4  PROT 6.9  ALBUMIN 3.6   No results for input(s): LIPASE, AMYLASE in the last 72 hours. CBC:  Recent Labs  04/15/16 2032  04/17/16 0613 04/18/16 0503  WBC 13.6*  < > 14.7* 12.6*  NEUTROABS 11.8*  --   --   --   HGB 10.7*  < > 9.3* 9.7*  HCT 31.6*  < > 29.3* 29.8*  MCV 88.7  < > 90.3 89.0  PLT 298  < > 216 217  < > = values in this interval not displayed. Cardiac Enzymes:  Recent Labs  04/15/16 2032  TROPONINI 0.03*   BNP: Invalid input(s): POCBNP D-Dimer: No results for input(s): DDIMER in the last 72 hours. Hemoglobin A1C:  Recent Labs  04/16/16 0455  HGBA1C 5.2   Fasting Lipid Panel: No results for input(s): CHOL, HDL, LDLCALC, TRIG, CHOLHDL, LDLDIRECT in the last 72 hours. Thyroid Function Tests: No results for input(s): TSH, T4TOTAL, T3FREE, THYROIDAB in the last 72 hours.  Invalid input(s): FREET3 Anemia Panel: No results for input(s): VITAMINB12, FOLATE, FERRITIN, TIBC, IRON, RETICCTPCT in the last 72  hours.   PHYSICAL EXAM General: Well developed, well nourished, in no acute distress HEENT:  Normocephalic and atramatic Neck:  No JVD.  Lungs: Clear bilaterally to auscultation and percussion. Heart: HRRR . Normal S1 and S2 without gallops or murmurs.  Abdomen: Bowel sounds are positive, abdomen soft and non-tender  Msk:  Back normal, normal gait. Normal strength and tone for age. Extremities: No clubbing, cyanosis or edema.   Neuro: Alert and oriented X 3. Psych:  Good affect, responds appropriately  TELEMETRY: Sinus rhythm  ASSESSMENT AND PLAN: Mildly elevated troponin due to demand ischemia with history of coronary artery disease and urosepsis. Patient does not have any chest pain and will do outpatient workup such as Lexiscan Myoview in the office upon discharge. Spoke to her daughter and will give follow-up in the office.  Principal Problem:   Sepsis (HCC) Active Problems:   Hypothyroidism   Hypertension   HLD (  hyperlipidemia)   Coronary artery disease   Type 2 diabetes with nephropathy (HCC)   UTI (urinary tract infection)   Delirium due to sepsis   Major neurocognitive disorder, due to brain atrophy and cerebrovascular disease    Leslie Weber A, MD, [facility] AntonioFACC 04/18/2016 12:00 PM

## 2016-04-18 NOTE — [place] FL2 (Signed)
[place] MEDICAID FL2 LEVEL OF CARE SCREENING TOOL     IDENTIFICATION  Patient Name: Leslie Weber Birthdate: [birth year] Sex: female Admission Date (Current Location): 04/15/2016  [place]   Facility and Address:  [facility], [address], [place], [place] [address]      Provider Number: [id number]  Attending Physician Name and Address:  Adrian SaranSital Mody, MD  Relative Name and Phone Number:       Current Level of Care: Hospital Recommended Level of Care: Skilled Nursing Facility Prior Approval Number:    Date Approved/Denied:   PASRR Number: [id number]  Discharge Plan: SNF    Current Diagnoses: Patient Active Problem List   Diagnosis Date Noted  . Sepsis (HCC) 04/15/2016  . UTI (urinary tract infection) 04/15/2016  . Patellar fracture 03/19/2016  . Wound dehiscence 02/17/2016  . Altered mental status 01/15/2016  . S/P total knee arthroplasty 01/12/2016  . Pulmonary arterial hypertension 11/04/2015  . 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 Obesity 05/03/2014  . Peripheral neuropathy (HCC) 05/03/2014  . Spinal stenosis 05/03/2014  . Hypothyroidism 05/03/2014  . Hypertension 05/03/2014  . HLD (hyperlipidemia) 05/03/2014  . Coronary artery disease 05/03/2014  . DDD (degenerative disc disease), lumbar 01/08/2014    Orientation RESPIRATION BLADDER Height & Weight     Self, Time, Situation, Place  Normal Continent Weight: 215 lb 9 oz (97.8 kg) Height:  5\' 4"  (162.6 cm)  BEHAVIORAL SYMPTOMS/MOOD NEUROLOGICAL BOWEL NUTRITION STATUS      Continent    AMBULATORY STATUS COMMUNICATION OF NEEDS Skin   Extensive Assist Verbally Normal                       Personal Care Assistance Level of Assistance  Bathing, Feeding, Dressing Bathing Assistance: Limited assistance Feeding assistance: Independent Dressing Assistance: Limited assistance     Functional Limitations Info              SPECIAL CARE FACTORS FREQUENCY  PT (By licensed PT), OT (By licensed OT)     PT Frequency: Up to 5X per day, 5 days per week OT Frequency: Up to 5X per day, 5 days per week            Contractures Contractures Info: Present    Additional Factors Info  Allergies   Allergies Info: Oxycodone           Current Medications (04/18/2016):  This is the current hospital active medication list Current Facility-Administered Medications  Medication Dose Route Frequency Provider Last Rate Last Dose  . acetaminophen (TYLENOL) tablet 650 mg  650 mg Oral Q6H PRN Oralia Manisavid Willis, MD       Or  . acetaminophen (TYLENOL) suppository 650 mg  650 mg Rectal Q6H PRN Oralia Manisavid Willis, MD   650 mg at 04/16/16 1704  . anidulafungin (ERAXIS) 200 mg in sodium chloride 0.9 % 200 mL IVPB  200 mg Intravenous Q24H Mick Sellavid P Fitzgerald, MD   200 mg at 04/17/16 1644  . atorvastatin (LIPITOR) tablet 40 mg  40 mg Oral BH-q7a Adrian SaranSital Mody, MD   40 mg at 04/18/16 0930  . citalopram (CELEXA) tablet 20 mg  20 mg Oral BH-q7a Adrian SaranSital Mody, MD   20 mg at 04/18/16 0930  . enoxaparin (LOVENOX) injection 40 mg  40 mg Subcutaneous Q24H Oralia Manisavid Willis, MD   40 mg at 04/17/16 2113  . gabapentin (NEURONTIN) tablet 600 mg  600 mg Oral TID Sital Mody,  MD   600 mg at 04/18/16 0930  . hydrALAZINE (APRESOLINE) injection 10 mg  10 mg Intravenous Q6H PRN Sital Mody, MD      . insulin aspart (novoLOG) injection 0-5 Units  0-5 Units Subcutaneous QHS Oralia Manis, MD      . insulin aspart (novoLOG) injection 0-9 Units  0-9 Units Subcutaneous TID WC Oralia Manis, MD   1 Units at 04/16/16 1735  . labetalol (NORMODYNE,TRANDATE) injection 5-10 mg  5-10 mg Intravenous Q2H PRN Arnaldo Natal, MD   5 mg at 04/16/16 0521  . levothyroxine (SYNTHROID, LEVOTHROID) tablet 50 mcg  50 mcg Oral QAC breakfast Adrian Saran, MD   50 mcg at 04/18/16 0930  . magnesium sulfate IVPB 4 g 100 mL  4 g Intravenous Once Adrian Saran, MD   4 g at 04/18/16 0857  . meropenem  (MERREM) IVPB SOLR 1 g  1 g Intravenous Q8H Sital Mody, MD      . nortriptyline (PAMELOR) capsule 75 mg  75 mg Oral QHS Adrian Saran, MD   75 mg at 04/17/16 2044  . ondansetron (ZOFRAN) injection 4 mg  4 mg Intravenous Q6H PRN Houston Siren, MD   4 mg at 04/17/16 1831  . potassium chloride SA (K-DUR,KLOR-CON) CR tablet 20 mEq  20 mEq Oral Q2H Adrian Saran, MD   20 mEq at 04/18/16 0936  . promethazine (PHENERGAN) injection 12.5 mg  12.5 mg Intravenous Q6H PRN Houston Siren, MD   12.5 mg at 04/17/16 2045  . sodium chloride flush (NS) 0.9 % injection 3 mL  3 mL Intravenous Q12H Oralia Manis, MD   3 mL at 04/17/16 2044  . traMADol (ULTRAM) tablet 50 mg  50 mg Oral Q6H PRN Adrian Saran, MD   50 mg at 04/18/16 0930  . vancomycin (VANCOCIN) IVPB 1000 mg/200 mL premix  1,000 mg Intravenous Q18H Phineas Semen, MD   1,000 mg at 04/17/16 1823     Discharge Medications: Please see discharge summary for a list of discharge medications.  Relevant Imaging Results:  Relevant Lab Results:   Additional Information SS# [id number]  Judi Cong, LCSW

## 2016-04-18 NOTE — Progress Notes (Signed)
Pt arousing more easily now, more verbal, pt drinking some tea and ensure, daughter at bedside assisting pt
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
## 2016-04-18 NOTE — Progress Notes (Signed)
PT Cancellation Note  Patient Details Name: Leslie Weber MRN: [id number] DOB: 1940-04-18   Cancelled Treatment:    Reason Eval/Treat Not Completed: Other (comment).  PT consult received.  Chart reviewed.  Per nursing and chart review, pt with agitation last night and this morning and is finally resting.  Nursing requesting to hold PT today.  Will re-attempt PT eval tomorrow as medically appropriate.  Leslie Weber Current 04/18/2016, 12:13 PM Leslie Weber, PT [phone]

## 2016-04-18 NOTE — Progress Notes (Addendum)
Pt now somnolent, likely due to not sleeping last night and medications that were administered on previous shift in the early morning, pt arouses to touch and voice, daughter at bedside

## 2016-04-18 NOTE — Progress Notes (Signed)
Pharmacy Antibiotic Note  Leslie ClarkGean I Weber is a 76 y.o. female admitted on 04/15/2016 with sepsis.  Pharmacy has been consulted for vancomycin and Zosyn dosing. Zosyn changed to meropenem. Anidulafungin added per ID.   Plan: DW 69kg  Vd 48L kei 0.042 hr-1  T1/2 17 hours Vancomycin 1 gram q 18 hours ordered with stacked dosing. Level before 5th dose. Goal trough 15-20.  Meropenem 1 g iv q 12 hours.   Anidulafungin 200 mg iv once then 100 mg iv q 24h.   1/28  Vanc Trough = 18. Continue current dosing.   Meropenem increased to Q8H dosing based on improved renal function (CrCl ~ 71 ml/min).  Height: 5\' 4"  (162.6 cm) Weight: 215 lb 9 oz (97.8 kg) IBW/kg (Calculated) : 54.7  Temp (24hrs), Avg:98 F (36.7 C), Min:97.5 F (36.4 C), Max:98.2 F (36.8 C)   Recent Labs Lab 04/15/16 2032 04/16/16 0455 04/17/16 0613 04/18/16 0503 04/18/16 1135  WBC 13.6* 17.0* 14.7* 12.6*  --   CREATININE 1.24* 1.18* 1.11* 0.71  --   LATICACIDVEN 0.9  --   --   --   --   VANCOTROUGH  --   --   --   --  18    Estimated Creatinine Clearance: 71.1 mL/min (by C-G formula based on SCr of 0.71 mg/dL).    Allergies  Allergen Reactions  . Oxycodone Other (See Comments)    "stopped breathing"    Antimicrobials this admission: Vancomycin 1/25 >>  Zosyn 1/25 >> 1/26 Meropenem 1/26 >>  Dose adjustments this admission: 1/28  Meropenem 1g IV Q12H >> Q8H.  Microbiology results: 1/25 BCx: pending 1/25 UCx: pending  03/18/16  MRSA (-)  1/25 UA: LE(+) NO2(+) WBC TNTC 1/25 CXR: bibasilar atelectasis  Thank you for allowing pharmacy to be a part of this patient's care.  Stormy CardKatsoudas,Porcia Morganti K, [place] Clinical Pharmacist 04/18/2016 12:24 PM

## 2016-04-18 NOTE — Progress Notes (Signed)
Dr Juliene PinaMody made aware that Leslie Weber noted with a cough and some wheezing, daughter concerned and requesting a chest xray, new order for chest xray

## 2016-04-18 NOTE — Clinical Social Work Note (Signed)
Clinical Social Work Assessment  Patient Details  Name: Leslie Weber MRN: [id number] Date of Birth: 06-22-1940  Date of referral:  04/18/16               Reason for consult:  Facility Placement                Permission sought to share information with:  Facility Industrial/product designerContact Representative Permission granted to share information::  Yes, Verbal Permission Granted  Name::        Agency::     Relationship::     Contact Information:     Housing/Transportation Living arrangements for the past 2 months:  Skilled Nursing Facility Source of Information:  Adult Children Patient Interpreter Needed:  None Criminal Activity/Legal Involvement Pertinent to Current Situation/Hospitalization:  No - Comment as needed Significant Relationships:  Adult Children, Spouse Lives with:  Facility Resident Do you feel safe going back to the place where you live?  Yes Need for family participation in patient care:  Yes (Comment)  Care giving concerns:  Patient and her family do not want her to return to [facility]  Social Worker assessment / plan:  CSW spoke with patient's daughter about her concerns with the originating SNF ([facility]). The family wants the [facility]; however, they are open to other placement. The patient's daughter is aware that her mother is in [facility] days [facility], and she would like to know what the co-pay would be. CSW has sent referral. Patient is pending continued medical work up for UTI.   Employment status:  Retired Database administratornsurance information:  Managed Medicare PT Recommendations:  Not assessed at this time Information / Referral to community resources:  Skilled Nursing Facility  Patient/Family's Response to care:  Patient's daughter thanked CSW for assistance.  Patient/Family's Understanding of and Emotional Response to Diagnosis, Current Treatment, and Prognosis:  Patient and family aware of insurance policies and continuing needs.  Emotional  Assessment Appearance:  Appears stated age Attitude/Demeanor/Rapport:   (Was not able to visit) Affect (typically observed):   (Was not able to visit) Orientation:   (Was not able to visit) Alcohol / Substance use:  Never Used Psych involvement (Current and /or in the community):  No (Comment)  Discharge Needs  Concerns to be addressed:  Care Coordination, Other (Comment Required (Patient does not want to return to her SNF) Readmission within the last 30 days:  Yes Current discharge risk:  Chronically ill Barriers to Discharge:  Other, Continued Medical Work up (Patient does not want to return to her SNF)   Judi CongKaren M Jaymarion Trombly, LCSW 04/18/2016, 9:47 AM

## 2016-04-18 NOTE — Progress Notes (Signed)
PHARMACY CONSULT NOTE - INITIAL   Pharmacy Consult for Electrolyte Monitoring    Patient Measurements: Height: $RemoveBeforeDEI _SutiCNVTeFrHRkhKeVZpAVSRnFPMHLtQ$5\' 4"Calculated) : 54.7  Labs:  Recent Labs  04/15/16 2032 04/16/16 0455 04/17/16 0613 04/18/16 0503  WBC 13.6* 17.0* 14.7* 12.6*  HGB 10.7* 10.9* 9.3* 9.7*  HCT 31.6* 33.9* 29.3* 29.8*  PLT 298 255 216 217  CREATININE 1.24* 1.18* 1.11* 0.71  MG  --  1.1* 1.8 1.4*  ALBUMIN 3.6  --   --   --   PROT 6.9  --   --   --   AST 14*  --   --   --   ALT 9*  --   --   --   ALKPHOS 84  --   --   --   BILITOT 0.4  --   --   --    Estimated Creatinine Clearance: 71.1 mL/min (by C-G formula based on SCr of 0.71 mg/dL).  Assessment: Pharmacy consulted for electrolyte replacement in 76 yo female.   Goal of Therapy:  K+: 3.5-5.0 Mg: 1.8-25  Plan:  1/28 @ 1804: K+ 3.1  Will order KCL 40mEq po X1 dose. Will recheck electrolytes with AM labs    Gardner CandleSheema M Falan Hensler, PharmD, BCPS Clinical Pharmacist 04/18/2016 7:05 PM

## 2016-04-18 NOTE — Progress Notes (Signed)
Dr Juliene PinaMody made aware that pt with increased agitation, combative, throwing water pitcher, hitting at nurse, refused to take morning meds, telesitter initiated, daughter POA made aware, daughter is coming to sit with pt, MD also made aware of 5 beat run of vtach this morning, acknowledged, no new orders at this time

## 2016-04-18 NOTE — Progress Notes (Signed)
Dr Juliene PinaMody made aware that pt remains lethargic/somnolent since around 11am, pt arouses to touch and voice but then drifts back to sleep, per Dr Juliene PinaMody continue to monitor pt, this is likely due to haldol given in the early morning and exhaustion from pts agitation/combativeness and sleeplessness.  Discontinue gabapentin for now, hold scheduled PO haldol if sedated and continue to monitor

## 2016-04-19 ENCOUNTER — Encounter: Payer: Self-pay | Admitting: Internal Medicine

## 2016-04-19 LAB — CBC
HEMATOCRIT: 25.7 % — AB (ref 35.0–47.0)
HEMOGLOBIN: 8.5 g/dL — AB (ref 12.0–16.0)
MCH: 29.2 pg (ref 26.0–34.0)
MCHC: 33.1 g/dL (ref 32.0–36.0)
MCV: 88.2 fL (ref 80.0–100.0)
Platelets: 228 10*3/uL (ref 150–440)
RBC: 2.91 MIL/uL — ABNORMAL LOW (ref 3.80–5.20)
RDW: 15.7 % — AB (ref 11.5–14.5)
WBC: 8.9 10*3/uL (ref 3.6–11.0)

## 2016-04-19 LAB — GLUCOSE, CAPILLARY
GLUCOSE-CAPILLARY: 129 mg/dL — AB (ref 65–99)
GLUCOSE-CAPILLARY: 154 mg/dL — AB (ref 65–99)
Glucose-Capillary: 113 mg/dL — ABNORMAL HIGH (ref 65–99)
Glucose-Capillary: 111 mg/dL — ABNORMAL HIGH (ref 65–99)

## 2016-04-19 LAB — RPR: RPR Ser Ql: NONREACTIVE

## 2016-04-19 LAB — BASIC METABOLIC PANEL
ANION GAP: 3 — AB (ref 5–15)
BUN: 18 mg/dL (ref 6–20)
CO2: 27 mmol/L (ref 22–32)
Calcium: 8.7 mg/dL — ABNORMAL LOW (ref 8.9–10.3)
Chloride: 112 mmol/L — ABNORMAL HIGH (ref 101–111)
Creatinine, Ser: 0.8 mg/dL (ref 0.44–1.00)
GFR calc Af Amer: >60 mL/min (ref >60)
GFR calc non Af Amer: >60 mL/min (ref >60)
Glucose, Bld: 92 mg/dL (ref 65–99)
POTASSIUM: 3.5 mmol/L (ref 3.5–5.1)
SODIUM: 142 mmol/L (ref 135–145)

## 2016-04-19 LAB — MAGNESIUM: MAGNESIUM: 2.1 mg/dL (ref 1.7–2.4)

## 2016-04-19 MED ORDER — SENNOSIDES-DOCUSATE SODIUM 8.6-50 MG PO TABS
2.0000 | ORAL_TABLET | Freq: Two times a day (BID) | ORAL | Status: DC
  Administered 2016-04-19 – 2016-04-21 (×3): 2 via ORAL
  Filled 2016-04-19 (×4): qty 2

## 2016-04-19 MED ORDER — LIDOCAINE VISCOUS 2 % MT SOLN
15.0000 mL | Freq: Once | OROMUCOSAL | Status: AC
  Administered 2016-04-19: 15:00:00 15 mL via OROMUCOSAL
  Filled 2016-04-19: qty 15

## 2016-04-19 MED ORDER — PANTOPRAZOLE SODIUM 40 MG PO TBEC
40.0000 mg | DELAYED_RELEASE_TABLET | Freq: Two times a day (BID) | ORAL | Status: DC
  Administered 2016-04-19: 40 mg via ORAL
  Filled 2016-04-19: qty 1

## 2016-04-19 MED ORDER — ALUM & MAG HYDROXIDE-SIMETH 200-200-20 MG/5ML PO SUSP
30.0000 mL | Freq: Four times a day (QID) | ORAL | Status: DC | PRN
  Administered 2016-04-19 (×2): 30 mL via ORAL
  Filled 2016-04-19 (×2): qty 30

## 2016-04-19 MED ORDER — PANTOPRAZOLE SODIUM 40 MG PO TBEC
40.0000 mg | DELAYED_RELEASE_TABLET | Freq: Every day | ORAL | Status: DC
  Administered 2016-04-19 – 2016-04-21 (×3): 40 mg via ORAL
  Filled 2016-04-19 (×3): qty 1

## 2016-04-19 MED ORDER — SULFAMETHOXAZOLE-TRIMETHOPRIM 800-160 MG PO TABS
1.0000 | ORAL_TABLET | Freq: Two times a day (BID) | ORAL | Status: DC
  Administered 2016-04-19 – 2016-04-21 (×4): 1 via ORAL
  Filled 2016-04-19 (×7): qty 1

## 2016-04-19 NOTE — Progress Notes (Signed)
[facility] [place] [facility]   PATIENT NAME: Leslie Weber    MR#:  [id number]  DATE OF BIRTH:  Apr 02, 1940  SUBJECTIVE:   More awake today. Family at bedside. On 2 L oxygen which is chronic.  Complains of heartburn. Improved with Maalox.  REVIEW OF SYSTEMS:    Review of Systems  Unable to perform ROS: Other   patient confused   DRUG ALLERGIES:   Allergies  Allergen Reactions  . Oxycodone Other (See Comments)    "stopped breathing"    VITALS:  Blood pressure (!) 163/87, pulse 85, temperature 97.9 F (36.6 C), temperature source Oral, resp. rate 20, height 5\' 4"  (1.626 m), weight 97.8 kg (215 lb 9 oz), SpO2 100 %.  PHYSICAL EXAMINATION:   Physical Exam  Constitutional: She is well-developed, well-nourished, and in no distress. No distress.  HENT:  Head: Normocephalic.  Eyes: No scleral icterus.  Neck: Normal range of motion. Neck supple. No JVD present. No tracheal deviation present.  Cardiovascular: Normal rate, regular rhythm and normal heart sounds.  Exam reveals no gallop and no friction rub.   No murmur heard. Pulmonary/Chest: Effort normal and breath sounds normal. No respiratory distress. She has no wheezes. She has no rales. She exhibits no tenderness.  Abdominal: Soft. Bowel sounds are normal. She exhibits no distension and no mass. There is no tenderness. There is no rebound and no guarding.  Musculoskeletal: She exhibits no edema or tenderness.  She has a bruit on the left leg in a brace on the right leg  Neurological: She is alert.  Oriented to place and name  Skin: Skin is warm. No rash noted. No erythema.  Right knee scar is well-healed      LABORATORY PANEL:   CBC  Recent Labs Lab 04/19/16 0458  WBC 8.9  HGB 8.5*  HCT 25.7*  PLT 228   ------------------------------------------------------------------------------------------------------------------  Chemistries   Recent Labs Lab 04/15/16 2032  04/19/16 0458   NA 140  < > 142  K 3.8  < > 3.5  CL 103  < > 112*  CO2 28  < > 27  GLUCOSE 148*  < > 92  BUN 26*  < > 18  CREATININE 1.24*  < > 0.80  CALCIUM 9.9  < > 8.7*  MG  --   < > 2.1  AST 14*  --   --   ALT 9*  --   --   ALKPHOS 84  --   --   BILITOT 0.4  --   --   < > = values in this interval not displayed. ------------------------------------------------------------------------------------------------------------------  Cardiac Enzymes  Recent Labs Lab 04/15/16 2032  TROPONINI 0.03*   ------------------------------------------------------------------------------------------------------------------  RADIOLOGY:  Dg Chest Port 1 View  Result Date: 04/18/2016 CLINICAL DATA:  UTI and sepsis EXAM: PORTABLE CHEST 1 VIEW COMPARISON:  04/15/2016 FINDINGS: Elevation the right hemidiaphragm is again seen. Cardiac shadow is stable. Hiatal hernia is again seen. No focal infiltrate or sizable effusion is seen. IMPRESSION: No acute abnormality noted. Electronically Signed   By: Alcide CleverMark  Lukens M.D.   On: 04/18/2016 18:26     ASSESSMENT AND PLAN:   76 year old female with a history of CAD,multiple UTIs and recent knee replacement with PICC line who presents with sepsis.  1. Sepsis: Patient presented with fever, tachycardia leukocytosis Patient has urinary tract infection but may also have bloodstream infection from PICC line, not felt to be from an infection from knee replacement as per ortho consult.  Appreciate ID and ORTHO input. Stop IV antibiotics. Bactrim for 10 days.  Final cultures on blood and PICC tip are negative  2. Urinary tract infection: U cx suggests multiple organisms  3. Hypothyroidism: Continue Synthroid  4. History of CAD: Atorvastatin  5.. Diabetes:Continue Sliding scale A1C 5.2  6. Nonsustained V. tach: Due to electrolyte abnormalities No further episodes  7. Acute encephalopathy due to hospital delirium with probable cognitive impairment at baseline Psych  consult for assistance with medications. Appreciate input  8. Hypokalemia and hypo-magnesium Replaced   Management plans discussed with the patient's daughter yesterday  and she is in agreement. I will call daughter later today  CODE STATUS: FULL  TOTAL TIME TAKING CARE OF THIS PATIENT: 25 minutes.   POSSIBLE D/C 1-2 days, DEPENDING ON CLINICAL CONDITION.  Milagros Loll R M.D on 04/19/2016 at 4:08 PM  Between 7am to 6pm - Pager - [phone]  After 6pm go to [email or web address] - password [email or web address]  [facility]  Office  [phone]  CC: Primary care physician; Rafael Bihari, MD  Note: This dictation was prepared with Dragon dictation along with smaller phrase technology. Any transcriptional errors that result from this process are unintentional.

## 2016-04-19 NOTE — Progress Notes (Signed)
L[facility]W followed up with [facility]: Marcelino DusterMichelle regarding co-pay days and prices as requested by daughter. Call placed to daughter: Alvino Chapelllen, message left regarding if they [facility]. Message left and will follow up with daughter once call returned.  Plan: TBD  Deretha EmoryHannah Noriah Osgood LCSW, MSW Clinical Social Work: System Wide Float Coverage for :  [phone]

## 2016-04-19 NOTE — Progress Notes (Signed)
SUBJECTIVE: More alert today   Vitals:   04/18/16 0448 04/18/16 1359 04/18/16 2021 04/19/16 0422  BP: (!) 153/93 (!) 146/85 (!) 159/86 (!) 145/68  Pulse: (!) 104 89 89 92  Resp: 20 18 20 20   Temp: 97.5 F (36.4 C) 97.2 F (36.2 C) 97.7 F (36.5 C) 98.1 F (36.7 C)  TempSrc: Oral Axillary Oral Oral  SpO2: 90% 99% 100% 98%  Weight:      Height:        Intake/Output Summary (Last 24 hours) at 04/19/16 1026 Last data filed at 04/19/16 [id number]  Gross per 24 hour  Intake             1230 ml  Output             1000 ml  Net              230 ml    LABS: Basic Metabolic Panel:  Recent Labs  47/82/9501/28/18 0503 04/18/16 1804 04/19/16 0458  NA 141  --  142  K 3.2* 3.1* 3.5  CL 109  --  112*  CO2 24  --  27  GLUCOSE 139*  --  92  BUN 17  --  18  CREATININE 0.71  --  0.80  CALCIUM 8.6*  --  8.7*  MG 1.4*  --  2.1   Liver Function Tests: No results for input(s): AST, ALT, ALKPHOS, BILITOT, PROT, ALBUMIN in the last 72 hours. No results for input(s): LIPASE, AMYLASE in the last 72 hours. CBC:  Recent Labs  04/18/16 0503 04/19/16 0458  WBC 12.6* 8.9  HGB 9.7* 8.5*  HCT 29.8* 25.7*  MCV 89.0 88.2  PLT 217 228   Cardiac Enzymes: No results for input(s): CKTOTAL, CKMB, CKMBINDEX, TROPONINI in the last 72 hours. BNP: Invalid input(s): POCBNP D-Dimer: No results for input(s): DDIMER in the last 72 hours. Hemoglobin A1C: No results for input(s): HGBA1C in the last 72 hours. Fasting Lipid Panel: No results for input(s): CHOL, HDL, LDLCALC, TRIG, CHOLHDL, LDLDIRECT in the last 72 hours. Thyroid Function Tests:  Recent Labs  04/18/16 1135  TSH 6.700*   Anemia Panel:  Recent Labs  04/18/16 1135  VITAMINB12 360     PHYSICAL EXAM General: Well developed, well nourished, in no acute distress HEENT:  Normocephalic and atramatic Neck:  No JVD.  Lungs: Clear bilaterally to auscultation and percussion. Heart: HRRR . Normal S1 and S2 without gallops or murmurs.   Abdomen: Bowel sounds are positive, abdomen soft and non-tender  Msk:  Back normal, normal gait. Normal strength and tone for age. Extremities: No clubbing, cyanosis or edema.   Neuro: Alert and oriented X 3. Psych:  Good affect, responds appropriately  TELEMETRY:   ASSESSMENT AND PLAN: Mildly elevated troponin due to demand ischemia with history of coronary artery disease and urosepsis. Patient does not have any chest pain and will do outpatient workup such as Lexiscan Myoview in the office upon discharge. Spoke to her daughter and will give follow-up in the office. Principal Problem:   Delirium due to sepsis Active Problems:   Hypothyroidism   Hypertension   HLD (hyperlipidemia)   Coronary artery disease   Type 2 diabetes with nephropathy (HCC)   Sepsis (HCC)   UTI (urinary tract infection)   Major neurocognitive disorder, due to brain atrophy and cerebrovascular disease    Arsh Feutz A, MD, [facility] 04/19/2016 10:26 AM

## 2016-04-19 NOTE — Progress Notes (Signed)
[facility] INFECTIOUS DISEASE PROGRESS NOTE Date of Admission:  04/15/2016     ID: Zara Council is a 76 y.o. female with fevers, sepsis Principal Problem:   Delirium due to sepsis Active Problems:   Hypothyroidism   Hypertension   HLD (hyperlipidemia)   Coronary artery disease   Type 2 diabetes with nephropathy (HCC)   Sepsis ([place])   UTI (urinary tract infection)   Major neurocognitive disorder, due to brain atrophy and cerebrovascular disease   Subjective: Doing much better. No fevers Completely alert, working with PT.   ROS  Eleven systems are reviewed and negative except per hpi  Medications:  Antibiotics Given (last 72 hours)    Date/Time Action Medication Dose Rate   04/16/16 2300 Given   meropenem (MERREM) IVPB SOLR 1 g 1 g 100 mL/hr   04/17/16 0034 Given   vancomycin (VANCOCIN) IVPB 1000 mg/200 mL premix 1,000 mg 200 mL/hr   04/17/16 1300 Given   meropenem (MERREM) IVPB SOLR 1 g 1 g 100 mL/hr   04/17/16 1823 Given   vancomycin (VANCOCIN) IVPB 1000 mg/200 mL premix 1,000 mg 200 mL/hr   04/17/16 2321 Given   meropenem (MERREM) IVPB SOLR 1 g 1 g 100 mL/hr   04/18/16 1120 Given   meropenem (MERREM) IVPB SOLR 1 g 1 g 100 mL/hr   04/18/16 1301 Given   vancomycin (VANCOCIN) IVPB 1000 mg/200 mL premix 1,000 mg 200 mL/hr   04/18/16 1611 Given   meropenem (MERREM) IVPB SOLR 1 g 1 g 100 mL/hr   04/19/16 0255 Given   meropenem (MERREM) IVPB SOLR 1 g 1 g 100 mL/hr   04/19/16 0526 Given   vancomycin (VANCOCIN) IVPB 1000 mg/200 mL premix 1,000 mg 200 mL/hr   04/19/16 0949 Given   meropenem (MERREM) IVPB SOLR 1 g 1 g 100 mL/hr     . anidulafungin  200 mg Intravenous Q24H  . atorvastatin  40 mg Oral BH-q7a  . citalopram  20 mg Oral BH-q7a  . enoxaparin (LOVENOX) injection  40 mg Subcutaneous Q24H  . haloperidol  0.5 mg Oral TID  . insulin aspart  0-5 Units Subcutaneous QHS  . insulin aspart  0-9 Units Subcutaneous TID WC  . levothyroxine  50 mcg Oral QAC breakfast   . lidocaine  15 mL Mouth/Throat Once  . Melatonin  10 mg Oral QHS  . meropenem  1 g Intravenous Q8H  . nortriptyline  75 mg Oral QHS  . pantoprazole  40 mg Oral BID AC  . sodium chloride flush  3 mL Intravenous Q12H  . vancomycin  1,000 mg Intravenous Q18H    Objective: Vital signs in last 24 hours: Temp:  [97.7 F (36.5 C)-98.1 F (36.7 C)] 98.1 F (36.7 C) (01/29 0422) Pulse Rate:  [89-92] 92 (01/29 0422) Resp:  [20] 20 (01/29 0422) BP: (145-159)/(68-86) 145/68 (01/29 0422) SpO2:  [98 %-100 %] 98 % (01/29 0422)  Physical Exam  Constitutional:  oriented to person, place, and time. appears well-developed and well-nourished. No distress.  HENT: [place]/AT, PERRLA, no scleral icterus Mouth/Throat: Oropharynx is clear and moist. No oropharyngeal exudate.  Cardiovascular: Normal rate, regular rhythm and normal heart sounds.  Pulmonary/Chest: Effort normal and breath sounds normal. No respiratory distress.  has no wheezes.  Neck = supple, no nuchal rigidity Abdominal: Soft. Bowel sounds are normal.  exhibits no distension. There is no tenderness.  Lymphadenopathy: no cervical adenopathy. No axillary adenopathy Neurological: alert and oriented to person, place, and time.  Skin: R  knee incsion cdi Psychiatric: a normal mood and affect.  behavior is normal.    Lab Results  Recent Labs  04/18/16 0503 04/18/16 1804 04/19/16 0458  WBC 12.6*  --  8.9  HGB 9.7*  --  8.5*  HCT 29.8*  --  25.7*  NA 141  --  142  K 3.2* 3.1* 3.5  CL 109  --  112*  CO2 24  --  27  BUN 17  --  18  CREATININE 0.71  --  0.80   Lab Results  Component Value Date   ESRSEDRATE 35 (H) 04/17/2016   Lab Results  Component Value Date   CRP 13.7 (H) 04/17/2016    Microbiology: Results for orders placed or performed during the hospital encounter of 04/15/16  Blood Culture (routine x 2)     Status: None (Preliminary result)   Collection Time: 04/15/16  8:32 PM  Result Value Ref Range Status   Specimen  Description BLOOD LEFT WRIST  Final   Special Requests   Final    BOTTLES DRAWN AEROBIC AND ANAEROBIC AER 7ML ANA 7ML   Culture NO GROWTH 4 DAYS  Final   Report Status PENDING  Incomplete  Blood Culture (routine x 2)     Status: None (Preliminary result)   Collection Time: 04/15/16  8:32 PM  Result Value Ref Range Status   Specimen Description BLOOD RIGHT WRIST  Final   Special Requests   Final    BOTTLES DRAWN AEROBIC AND ANAEROBIC AER 9ML ANA 10ML   Culture NO GROWTH 4 DAYS  Final   Report Status PENDING  Incomplete  Urine culture     Status: Abnormal   Collection Time: 04/15/16  8:32 PM  Result Value Ref Range Status   Specimen Description URINE, RANDOM  Final   Special Requests NONE  Final   Culture MULTIPLE SPECIES PRESENT, SUGGEST RECOLLECTION (A)  Final   Report Status 04/17/2016 FINAL  Final  Cath Tip Culture     Status: None (Preliminary result)   Collection Time: 04/16/16  1:23 PM  Result Value Ref Range Status   Specimen Description CATH TIP  Final   Special Requests NONE  Final   Culture   Final    NO GROWTH 3 DAYS Performed at [facility], [place] [address], [place], [place] [address]    Report Status PENDING  Incomplete    Studies/Results: Dg Chest Port 1 View  Result Date: 04/18/2016 CLINICAL DATA:  UTI and sepsis EXAM: PORTABLE CHEST 1 VIEW COMPARISON:  04/15/2016 FINDINGS: Elevation the right hemidiaphragm is again seen. Cardiac shadow is stable. Hiatal hernia is again seen. No focal infiltrate or sizable effusion is seen. IMPRESSION: No acute abnormality noted. Electronically Signed   By: Inez Catalina M.D.   On: 04/18/2016 18:26    Assessment/Plan: THEODORE RAHRIG is a 76 y.o. female with admission for AMS and fever/leukocytosis in setting of recent repeat R knee surgical repair and prolonged IV vanco and oral rifampin following a fall leading to wound dehisence and exposed wound.  On this admission her flu test neg, cxr neg, UA floridly positive. PICC line  has been removed and tip negative. [place] neg. UCX mixed. ESR only 35, CRP 13 I suspect all UTI related. Unfortunately UCX mixed. Day 5 of abx Recommendations At this point will stop IV abx and transition to oral bactrim ds bid for 10 days total abx Monitor knee as otpt for worsening Thank you very much for the  consult. Will follow with you.  [place], Panagiotis Oelkers P   04/19/2016, 2:31 PM

## 2016-04-19 NOTE — Care Management Obs Status (Addendum)
MEDICARE OBSERVATION STATUS NOTIFICATION   Patient Details  Name: Leslie Weber MRN: [id number] Date of Birth: 01/03/1941   Medicare Observation Status Notification Given:  Yes    Gwenette GreetBrenda S Porschia Willbanks, RN 04/19/2016, 10:10 AM

## 2016-04-19 NOTE — Evaluation (Signed)
Physical Therapy Evaluation Patient Details Name: Leslie Weber I Wenzlick MRN: [id number] DOB: Aug 12, 1940 Today's Date: 04/19/2016   History of Present Illness  Pt is a 76 y.o. female presenting to hospital from STR with fevers and AMS.  Pt admitted with sepsis and UTI.  Of note, pt s/p R TKA Jan 12 2016.  Pt s/p fall and [facility] ([facility] with R patellar fx and L ankle fx) who attempted fixation of patella and splinted L ankle.  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  PMH includes depression, fibromyalgia, Bell's palsy, neuropathy B LE's, and hiatal hernia.  Clinical Impression  Prior to multiple hospital admissions (hospitalizations starting end of fall 2017), pt was ambulating modified independently with RW; pt has spent the last couple months in the hospital or at rehab  (pt has been NWB'ing B LE's for about the last month and using hoyer lift).  Currently pt is max assist for logrolling in bed (pt declined sitting onto edge of bed d/t significant pain from "acid reflux"; nursing notified of pt's c/o pain).  MD Hooten paged after the PT session to clarify WB'ing status (see below for details).  Pt would benefit from skilled PT to address noted impairments and functional limitations.  Recommend pt discharge to STR when medically appropriate.    Follow Up Recommendations SNF    Equipment Recommendations   (TBD)    Recommendations for Other Services       Precautions / Restrictions Precautions Precautions: Fall Precaution Comments: R hinged knee brace locked in extension at all times Required Braces or Orthoses: Knee Immobilizer - Right Knee Immobilizer - Right: On at all times Restrictions Weight Bearing Restrictions: Yes Other Position/Activity Restrictions: Per verbal discussion with MD Hooten 04/19/16: pt NWB'ing B LE's unless working  directly with Physical Therapy.  When working with physical therapy pt may be: R LE WBAT with hinged knee brace locked in extension at all times (NO knee flexion); L LE foot flat TDWB'ing in boot      Mobility  Bed Mobility Overal bed mobility: Needs Assistance Bed Mobility: Rolling Rolling: Max assist         General bed mobility comments: vc's for hand placement; max assist to initiate roll and continue roll to R and L side (assist for LE's)  Transfers                 General transfer comment: pt declined sitting on edge of bed d/t c/o significant pain from acid reflux (pt switching from upright in bed to bed flat)  Ambulation/Gait                Stairs            Wheelchair Mobility    Modified Rankin (Stroke Patients Only)       Balance                                             Pertinent Vitals/Pain Pain Assessment: 0-10 Pain Score: 9  Pain Location: acid reflux; L mid/lower abdomen Pain Descriptors / Indicators: Discomfort Pain Intervention(s): Limited activity within patient's tolerance;Monitored during session;Repositioned (RN notified regarding pt's pain complaints and location of pain)  Vitals (HR and  O2 on supplemental O2 via nasal cannula) stable and WFL throughout treatment session.    Home Living Family/patient expects to be discharged to:: Skilled nursing facility                      Prior Function Level of Independence: Independent with assistive device(s) (Prior to fall in late 2017 after R TKA)               Hand Dominance        Extremity/Trunk Assessment   Upper Extremity Assessment Upper Extremity Assessment: Generalized weakness    Lower Extremity Assessment Lower Extremity Assessment: RLE deficits/detail;LLE deficits/detail RLE Deficits / Details: R knee locked in extension via hinged knee brace; R DF AROM to just past neutral RLE: Unable to fully assess due to  immobilization RLE Sensation: history of peripheral neuropathy LLE Deficits / Details: L ankle immobilized in boot; L hip flexion and knee flexion/extension at least 3/5 LLE Sensation: decreased light touch;history of peripheral neuropathy (plantar surface of L foot)       Communication   Communication: No difficulties  Cognition Arousal/Alertness: Awake/alert Behavior During Therapy: WFL for tasks assessed/performed Overall Cognitive Status: Within Functional Limits for tasks assessed                      General Comments  Pt agreeable to limited PT session.    Exercises     Assessment/Plan    PT Assessment Patient needs continued PT services  PT Problem List Decreased strength;Decreased range of motion;Decreased activity tolerance;Decreased mobility;Decreased knowledge of use of DME;Decreased safety awareness;Decreased knowledge of precautions;Impaired sensation;Pain          PT Treatment Interventions DME instruction;Gait training;Functional mobility training;Therapeutic activities;Therapeutic exercise;Balance training;Patient/family education    PT Goals (Current goals can be found in the Care Plan section)  Acute Rehab PT Goals Patient Stated Goal: to not have acid reflux type pain anymore PT Goal Formulation: With patient Time For Goal Achievement: 05/03/16 Potential to Achieve Goals: Fair    Frequency 7X/week   Barriers to discharge Decreased caregiver support      Co-evaluation               End of Session Equipment Utilized During Treatment: Oxygen Activity Tolerance: Patient limited by pain Patient left: in bed;with call bell/phone within reach;with bed alarm set;with family/visitor present (R hinged knee brace adjusted for proper fit and locked in extension) Nurse Communication: Mobility status;Precautions;Weight bearing status;Patient requests pain meds (Pt NWB'ing B LE's)         Time: 1610-9604 PT Time Calculation (min) (ACUTE ONLY):  30 min   Charges:   PT Evaluation $PT Eval Moderate Complexity: 1 Procedure PT Treatments $Therapeutic Exercise: 8-22 mins   PT G CodesHendricks Limes 04-30-2016, 5:15 PM Hendricks Limes, PT [phone]

## 2016-04-20 ENCOUNTER — Encounter
Admission: RE | Admit: 2016-04-20 | Discharge: 2016-04-20 | Disposition: A | Discharge status: Home / Self Care | Payer: Medicare Other | Source: Ambulatory Visit | Attending: Internal Medicine | Admitting: Internal Medicine

## 2016-04-20 LAB — GLUCOSE, CAPILLARY
GLUCOSE-CAPILLARY: 137 mg/dL — AB (ref 65–99)
Glucose-Capillary: 106 mg/dL — ABNORMAL HIGH (ref 65–99)
Glucose-Capillary: 82 mg/dL (ref 65–99)
Glucose-Capillary: 76 mg/dL (ref 65–99)

## 2016-04-20 LAB — CULTURE, BLOOD (ROUTINE X 2)
CULTURE: NO GROWTH
Culture: NO GROWTH

## 2016-04-20 LAB — CATH TIP CULTURE: Culture: NO GROWTH

## 2016-04-20 LAB — PHOSPHORUS
Phosphorus: 1.3 mg/dL — ABNORMAL LOW (ref 2.5–4.6)
Phosphorus: 2.2 mg/dL — ABNORMAL LOW (ref 2.5–4.6)

## 2016-04-20 MED ORDER — TRAMADOL HCL 50 MG PO TABS: 50.0000 mg | ORAL_TABLET | Freq: Four times a day (QID) | ORAL | 0 refills | Status: AC | PRN

## 2016-04-20 MED ORDER — GABAPENTIN 600 MG PO TABS: 600.0000 mg | ORAL_TABLET | Freq: Two times a day (BID) | ORAL | Status: DC

## 2016-04-20 MED ORDER — SULFAMETHOXAZOLE-TRIMETHOPRIM 800-160 MG PO TABS: 1.0000 | ORAL_TABLET | Freq: Two times a day (BID) | ORAL | 0 refills | Status: DC

## 2016-04-20 MED ORDER — QUETIAPINE FUMARATE 25 MG PO TABS
25.0000 mg | ORAL_TABLET | Freq: Once | ORAL | Status: AC
  Administered 2016-04-20: 25 mg via ORAL
  Filled 2016-04-20: qty 1

## 2016-04-20 MED ORDER — GABAPENTIN 600 MG PO TABS
600.0000 mg | ORAL_TABLET | Freq: Two times a day (BID) | ORAL | Status: DC
  Administered 2016-04-20: 14:00:00 600 mg via ORAL
  Filled 2016-04-20 (×2): qty 1

## 2016-04-20 MED ORDER — METOPROLOL TARTRATE 25 MG PO TABS
25.0000 mg | ORAL_TABLET | Freq: Two times a day (BID) | ORAL | Status: DC
  Administered 2016-04-20 – 2016-04-21 (×2): 25 mg via ORAL
  Filled 2016-04-20 (×3): qty 1

## 2016-04-20 MED ORDER — LORAZEPAM 0.5 MG PO TABS: 0.5000 mg | ORAL_TABLET | Freq: Once | ORAL | Status: DC

## 2016-04-20 MED ORDER — POTASSIUM PHOSPHATES 15 MMOLE/5ML IV SOLN
30.0000 mmol | Freq: Once | INTRAVENOUS | Status: AC
  Administered 2016-04-21: 30 mmol via INTRAVENOUS
  Filled 2016-04-20: qty 10

## 2016-04-20 MED ORDER — METOPROLOL TARTRATE 25 MG PO TABS: 25.0000 mg | ORAL_TABLET | Freq: Two times a day (BID) | ORAL | Status: DC

## 2016-04-20 NOTE — Progress Notes (Signed)
SNF and Non-Emergent EMS Transport Benefits:  Number called: [phone] Rep: Delfino Lovett Reference Number: [place] active as of 03/22/14 with no deductible.  Out of pocket max is $4000, of which $198 met so far.  In-network SNF: $0 copay for days 1-20 and $50 daily copay for days 21-100. Once out of pocket is reached, patient covered at 100% for remainder of 100 day benefit period. Per rep, their records show the last SNF day used was 02/05/16 so SNF days should renew, however per CSW, their records are not up to date as patient recently in a facility.   3 day hospital stay is not required.  Josem Kaufmann is required: [phone].    Non-emergent EMS transport: $75 copay for each one way medically necessary, Medicare covered trip.  Josem Kaufmann is not required.

## 2016-04-20 NOTE — Progress Notes (Signed)
Clinical Social Worker (CSW) met with patient and her daughter Dorian Pod at bedside. Dorian Pod was tearful and reported that patient is not going back to [facility] and that she can't afford the long term care room rates at [facility], which are $385 for semi-private and $415 for private per day. CSW explained to daughter that patient will first go under rehab to [facility] and have a $50 co-pay per day and she has around 30 days left. Daughter continued to be tearful. CSW explained that other facilities like [facility] offer cheaper private pay room rates. Daughter reported that patient will not go back to [facility]. CSW explained that a decision will have to be made today. Daughter reported that patient will not qualify for medicaid. Daughter reported that she would call CSW when patient's husband gets to [facility] to continue discussion.   [employer], LCSW [phone]

## 2016-04-20 NOTE — Progress Notes (Signed)
[facility] [place] [facility]   PATIENT NAME: Leslie Weber    MR#:  [id number]  DATE OF BIRTH:  05-11-1940  SUBJECTIVE:   Confused overnight due to inpatient delirium. On 2 L oxygen.  Afebrile.  REVIEW OF SYSTEMS:    Review of Systems  Unable to perform ROS: Other   patient confused   DRUG ALLERGIES:   Allergies  Allergen Reactions  . Oxycodone Other (See Comments)    "stopped breathing"    VITALS:  Blood pressure 136/63, pulse 99, temperature 98.3 F (36.8 C), temperature source Oral, resp. rate 18, height 5\' 4"  (1.626 m), weight 97.8 kg (215 lb 9 oz), SpO2 99 %.  PHYSICAL EXAMINATION:   Physical Exam  Constitutional: She is well-developed, well-nourished, and in no distress. No distress.  HENT:  Head: Normocephalic.  Eyes: No scleral icterus.  Neck: Normal range of motion. Neck supple. No JVD present. No tracheal deviation present.  Cardiovascular: Normal rate, regular rhythm and normal heart sounds.  Exam reveals no gallop and no friction rub.   No murmur heard. Pulmonary/Chest: Effort normal and breath sounds normal. No respiratory distress. She has no wheezes. She has no rales. She exhibits no tenderness.  Abdominal: Soft. Bowel sounds are normal. She exhibits no distension and no mass. There is no tenderness. There is no rebound and no guarding.  Musculoskeletal: She exhibits no edema or tenderness.  Braces legs  Neurological: She is alert.  Oriented to place and name  Skin: Skin is warm. No rash noted. No erythema.  Right knee scar is well-healed   LABORATORY PANEL:   CBC  Recent Labs Lab 04/19/16 0458  WBC 8.9  HGB 8.5*  HCT 25.7*  PLT 228   ------------------------------------------------------------------------------------------------------------------  Chemistries   Recent Labs Lab 04/15/16 2032  04/19/16 0458  NA 140  < > 142  K 3.8  < > 3.5  CL 103  < > 112*  CO2 28  < > 27  GLUCOSE 148*  < > 92  BUN 26*  <  > 18  CREATININE 1.24*  < > 0.80  CALCIUM 9.9  < > 8.7*  MG  --   < > 2.1  AST 14*  --   --   ALT 9*  --   --   ALKPHOS 84  --   --   BILITOT 0.4  --   --   < > = values in this interval not displayed. ------------------------------------------------------------------------------------------------------------------  Cardiac Enzymes  Recent Labs Lab 04/15/16 2032  TROPONINI 0.03*   ------------------------------------------------------------------------------------------------------------------  RADIOLOGY:  Dg Chest Port 1 View  Result Date: 04/18/2016 CLINICAL DATA:  UTI and sepsis EXAM: PORTABLE CHEST 1 VIEW COMPARISON:  04/15/2016 FINDINGS: Elevation the right hemidiaphragm is again seen. Cardiac shadow is stable. Hiatal hernia is again seen. No focal infiltrate or sizable effusion is seen. IMPRESSION: No acute abnormality noted. Electronically Signed   By: Alcide Clever Weber.   On: 04/18/2016 18:26   ASSESSMENT AND PLAN:   76 year old female with a history of CAD,multiple UTIs and recent knee replacement with PICC line who presents with sepsis.  1. Sepsis: Patient presented with fever, tachycardia leukocytosis Patient has urinary tract infection but may also have bloodstream infection from PICC line, not felt to be from an infection from knee replacement as per ortho consult. Appreciate ID and ORTHO input. Stopped IV antibiotics 04/19/2016. Bactrim for 10 days.  Final cultures on blood and PICC tip are negative  2. Urinary tract  infection: U cx suggests multiple organisms  3. Hypothyroidism: Continue Synthroid  4. History of CAD: Atorvastatin  5.. Diabetes:Continue Sliding scale A1C 5.2  6. Nonsustained V. tach: Due to electrolyte abnormalities No further episodes Outpatient cardiology follow-up.  7. Acute encephalopathy due to hospital delirium with probable cognitive impairment at baseline Psych consult for assistance with medications. Appreciate  input Restart patient's Neurontin. One dose of Seroquel today. Ativan as needed.  8. Hypokalemia and hypo-magnesium Replaced  CODE STATUS: FULL  TOTAL TIME TAKING CARE OF THIS PATIENT: 30 minutes.   Patient basically close to being discharged. Family working on deciding regarding which rehabilitation she should be going to.  Leslie Weber, Leslie Weber Leslie Weber on 04/20/2016 at 1:31 PM  Between 7am to 6pm - Pager - [phone]  After 6pm go to [email or web address] - password Leslie Weber  [facility]  Office  [phone]  CC: Primary care physician; Leslie Weber, Leslie B, MD  Note: This dictation was prepared with Dragon dictation along with smaller phrase technology. Any transcriptional errors that result from this process are unintentional.

## 2016-04-20 NOTE — Progress Notes (Signed)
[facility] however she will have to pay $350 of her co-pay up front for 7 days. Clinical Social Worker (CSW) contacted patient's daughter Alvino Chapelllen and made her aware of above. Per daughter she will have to contact patient's husband to ask him if that will be okay. CSW emphasized that a bed will need to be chosen because patient will be ready for D/C soon. Daughter was adamant that patient was not ready for D/C today. CSW made MD aware of daughter's concerns about D/C.   [employer] Hughes IncorporatedBailey Tyshae Stair, LCSW [phone]

## 2016-04-20 NOTE — Care Management Important Message (Signed)
Important Message  Patient Details  Name: Krystal ClarkGean I Theissen MRN: [id number] Date of Birth: 12-22-1940   Medicare Important Message Given:  Yes    Gwenette GreetBrenda S Kao Conry, RN 04/20/2016, 12:09 PM

## 2016-04-20 NOTE — Progress Notes (Signed)
SUBJECTIVE: Patient appears confused and slightly agitated. She denies chest pain or shortness of breath. Daughter reports her mental status seems worse than yesterday.   Vitals:   04/19/16 0422 04/19/16 1507 04/19/16 2033 04/20/16 0515  BP: (!) 145/68 (!) 163/87 (!) 172/92 (!) 155/71  Pulse: 92 85 99 (!) 101  Resp: 20 20 17 18   Temp: 98.1 F (36.7 C) 97.9 F (36.6 C) 98.8 F (37.1 C) 97.8 F (36.6 C)  TempSrc: Oral Oral Oral Oral  SpO2: 98% 100% 100% 91%  Weight:      Height:        Intake/Output Summary (Last 24 hours) at 04/20/16 0939 Last data filed at 04/19/16 1300  Gross per 24 hour  Intake              480 ml  Output                0 ml  Net              480 ml    LABS: Basic Metabolic Panel:  Recent Labs  16/12/9599/28/18 0503 04/18/16 1804 04/19/16 0458  NA 141  --  142  K 3.2* 3.1* 3.5  CL 109  --  112*  CO2 24  --  27  GLUCOSE 139*  --  92  BUN 17  --  18  CREATININE 0.71  --  0.80  CALCIUM 8.6*  --  8.7*  MG 1.4*  --  2.1   Liver Function Tests: No results for input(s): AST, ALT, ALKPHOS, BILITOT, PROT, ALBUMIN in the last 72 hours. No results for input(s): LIPASE, AMYLASE in the last 72 hours. CBC:  Recent Labs  04/18/16 0503 04/19/16 0458  WBC 12.6* 8.9  HGB 9.7* 8.5*  HCT 29.8* 25.7*  MCV 89.0 88.2  PLT 217 228   Cardiac Enzymes: No results for input(s): CKTOTAL, CKMB, CKMBINDEX, TROPONINI in the last 72 hours. BNP: Invalid input(s): POCBNP D-Dimer: No results for input(s): DDIMER in the last 72 hours. Hemoglobin A1C: No results for input(s): HGBA1C in the last 72 hours. Fasting Lipid Panel: No results for input(s): CHOL, HDL, LDLCALC, TRIG, CHOLHDL, LDLDIRECT in the last 72 hours. Thyroid Function Tests:  Recent Labs  04/18/16 1135  TSH 6.700*   Anemia Panel:  Recent Labs  04/18/16 1135  VITAMINB12 360     PHYSICAL EXAM General: Well developed, well nourished, in no acute distress HEENT:  Normocephalic and  atramatic Neck:  No JVD.  Lungs: Clear bilaterally to auscultation and percussion. Heart: Rate borderline high 90-100bpm. . Normal S1 and S2 without gallops or murmurs.  Abdomen: Bowel sounds are positive, abdomen soft and non-tender  Msk:  Back normal, normal gait. Normal strength and tone for age. Extremities: No clubbing, cyanosis or edema.   Neuro: Confused/disorientated.Marland Kitchen. Psych:  Flat affect.   ASSESSMENT AND PLAN: Pt is agitated and confused. Heart rate of 90-100bpm. Blood pressures have been fluctuating for normal-high to very high. Consider labetalol injection or metoprolol 25mg  BID to control blood pressures. Will plan to follow up outpatient with a stress test once discharged.   Principal Problem:   Delirium due to sepsis Active Problems:   Hypothyroidism   Hypertension   HLD (hyperlipidemia)   Coronary artery disease   Type 2 diabetes with nephropathy (HCC)   Sepsis (HCC)   UTI (urinary tract infection)   Major neurocognitive disorder, due to brain atrophy and cerebrovascular disease    Caroleen HammanKristin Gerrad Welker, NP-C 04/20/2016 9:39 AM
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
## 2016-04-20 NOTE — Progress Notes (Signed)
Clinical Child psychotherapistocial Worker (CSW) had a family meeting with patient's 2 daughters Leslie Weber and Leslie Weber, patient's husband, and patient's siblings. Long term care and short term rehab was discussed along with finances. Long term care medicaid and private pay was explained. Hospice and palliative care options were also explained. It was decided by family that [facility] with palliative care to follow. Daughter Leslie Weber will go to [facility] today to complete admissions paper work. Plan is for patient to D/C tomorrow. MD aware of above. [facility] is aware of above.   Baker Hughes IncorporatedBailey Akane Tessier, LCSW [phone]

## 2016-04-20 NOTE — Discharge Summary (Addendum)
[employer] - [place] at [facility]   PATIENT NAME: Leslie Weber    MR#:  [id number]  DATE OF BIRTH:  07/09/1940  DATE OF ADMISSION:  04/15/2016 ADMITTING PHYSICIAN: Oralia Manis, MD  DATE OF DISCHARGE: 04/21/2016  PRIMARY CARE PHYSICIAN: Rafael Bihari, MD   ADMISSION DIAGNOSIS:  Lower urinary tract infectious disease [N39.0] Sepsis, due to unspecified organism (HCC) [A41.9] Altered mental status, unspecified altered mental status type [R41.82]  DISCHARGE DIAGNOSIS:  Principal Problem:   Delirium due to sepsis Active Problems:   Hypothyroidism   Hypertension   HLD (hyperlipidemia)   Coronary artery disease   Type 2 diabetes with nephropathy (HCC)   Sepsis (HCC)   UTI (urinary tract infection)   Major neurocognitive disorder, due to brain atrophy and cerebrovascular disease   SECONDARY DIAGNOSIS:   Past Medical History:  Diagnosis Date  . Arthritis   . Coronary artery disease    a. 11/2014 CT chest w/ coronary atherosclerosis.  . Depression   . Diabetes mellitus without complication (HCC)   . Fever due to malaria    76 years old  . Fibromyalgia   . GERD (gastroesophageal reflux disease)   . History of hiatal hernia   . Hypertension   . Hypothyroidism   . Neuromuscular disorder (HCC)    Bells palsy left side of face  . Neuropathy of both feet   . Osteoarthritis    a. s/p R TKA 12/2015.  Marland Kitchen Pneumonia 1997   history of walking pneumonia  . Sleep apnea    not yet had sleep study     ADMITTING HISTORY  Leslie Weber  is a 76 y.o. female who presents with Significantly increased confusion. Patient was brought from her nursing facility for the same. Here she was found to meet sepsis criteria and has UTI. Hospitalists were called for admission and treatment. Of note, this UTI came after multiple orthopedic surgeries on both of her lower extremities over the past 2-3 months.  HOSPITAL COURSE:   76 year old female with a history of CAD,multiple  UTIs and recent knee replacement with PICC line who presents with sepsis.  1. Sepsis: Patient presented with fever, tachycardia leukocytosis Patient has urinary tract infection but  bloodstream infection from PICC line considered, not felt to be from an infection from knee replacement as per ortho consult. ID and Ortho followed patient in hospital. Treated with broad spectrum IV abx and Eraxis. Bcx negative PICC tip cx negative Urine cx showed multiple organisms, likely contaminant. Dr. Sampson Goon stopped IV antibiotics on 04/19/2016. Bactrim for 10 days. PO BACTRIM started on 04/19/2016 and monitored in hospital and she is doing well.  2. Hypothyroidism: Continue Synthroid  3. History of CAD: Atorvastatin  4. Diabetes:ContinueSliding scale A1C 5.2  5. Nonsustained V. tach: Due to electrolyte abnormalities No further episodes Seen by cardiology. Started on metoprolol. OP f/u with Dr. Welton Flakes in 1-2 weeks  7. Acute encephalopathy due to hospital delirium with mild cognitive impairment at baseline. Likely vascular dementia Psych has seen patient in hospital. Added Haldol. Ativan QHS PRN  8. Hypokalemia and hypo-magnesium Replaced  With recent LE surgeries. Dr. Ernest Pine has wanted patient to be Full weight bearing on right foot with boot and Non weight bearing on Left lower extremity for 2 weeks.  Stable for discharge to SNF  Palliative care to follow patient at SNF  CONSULTS OBTAINED:  Treatment Team:  Donato Heinz, MD Mick Sell, MD Laurier Nancy, MD Jimmy Footman, MD  DRUG ALLERGIES:   Allergies  Allergen Reactions  . Oxycodone Other (See Comments)    "stopped breathing"    DISCHARGE MEDICATIONS:   Current Discharge Medication List    START taking these medications   Details  haloperidol (HALDOL) 0.5 MG tablet Take 1 tablet (0.5 mg total) by mouth at bedtime.    LORazepam (ATIVAN) 0.5 MG tablet Take 1 tablet (0.5 mg total) by  mouth at bedtime as needed for anxiety. Qty: 10 tablet, Refills: 0    metoprolol tartrate (LOPRESSOR) 25 MG tablet Take 1 tablet (25 mg total) by mouth 2 (two) times daily.    sulfamethoxazole-trimethoprim (BACTRIM DS,SEPTRA DS) 800-160 MG tablet Take 1 tablet by mouth 2 (two) times daily. Qty: 14 tablet, Refills: 0      CONTINUE these medications which have CHANGED   Details  gabapentin (NEURONTIN) 600 MG tablet Take 0.5 tablets (300 mg total) by mouth 2 (two) times daily.    traMADol (ULTRAM) 50 MG tablet Take 1 tablet (50 mg total) by mouth every 6 (six) hours as needed for moderate pain. Qty: 20 tablet, Refills: 0      CONTINUE these medications which have NOT CHANGED   Details  acetaminophen (TYLENOL) 500 MG tablet Take 500 mg by mouth every 6 (six) hours.    atorvastatin (LIPITOR) 40 MG tablet Take 40 mg by mouth every morning.    Calcium Carb-Cholecalciferol (CALCIUM 600+D) 600-800 MG-UNIT TABS Take 1 Dose by mouth 2 times daily at 12 noon and 4 pm.    celecoxib (CELEBREX) 200 MG capsule Take 200 mg by mouth 2 (two) times daily.    citalopram (CELEXA) 20 MG tablet Take 20 mg by mouth every morning.    ferrous sulfate 325 (65 FE) MG tablet Take 325 mg by mouth daily with breakfast.    levothyroxine (SYNTHROID, LEVOTHROID) 50 MCG tablet Take 50 mcg by mouth daily before breakfast.    liraglutide (VICTOZA) 18 MG/3ML SOPN Inject 0.6 mg into the skin every morning.    Multiple Vitamins-Minerals (CENTRUM WOMEN PO) Take 1 tablet by mouth every morning.    nortriptyline (PAMELOR) 75 MG capsule Take 75 mg by mouth at bedtime.    pantoprazole (PROTONIX) 40 MG tablet Take 40 mg by mouth daily before breakfast.       STOP taking these medications     ertapenem 1 g in sodium chloride 0.9 % 50 mL         Today   VITAL SIGNS:  Blood pressure (!) 151/77, pulse 76, temperature 97.5 F (36.4 C), temperature source Oral, resp. rate 16, height 5\' 4"  (1.626 m), weight 97.8 kg  (215 lb 9 oz), SpO2 100 %.  I/O:    Intake/Output Summary (Last 24 hours) at 04/21/16 1015 Last data filed at 04/21/16 0300  Gross per 24 hour  Intake              510 ml  Output                0 ml  Net              510 ml    PHYSICAL EXAMINATION:  Physical Exam  GENERAL:  76 y.o.-year-old patient lying in the bed with no acute distress.  LUNGS: Normal breath sounds bilaterally, no wheezing, rales,rhonchi or crepitation. No use of accessory muscles of respiration.  CARDIOVASCULAR: S1, S2 normal. No murmurs, rubs, or gallops.  ABDOMEN: Soft, non-tender, non-distended. Bowel sounds present. No organomegaly or mass.  NEUROLOGIC: Moves all 4 extremities. PSYCHIATRIC: The patient is alert and awake. Pleasantly confused Lower extremity splints  DATA REVIEW:   CBC  Recent Labs Lab 04/21/16 0445  WBC 11.4*  HGB 8.8*  HCT 26.8*  PLT 271    Chemistries   Recent Labs Lab 04/15/16 2032  04/21/16 0445  NA 140  < > 138  K 3.8  < > 3.7  CL 103  < > 104  CO2 28  < > 29  GLUCOSE 148*  < > 106*  BUN 26*  < > 12  CREATININE 1.24*  < > 0.85  CALCIUM 9.9  < > 8.9  MG  --   < > 1.8  AST 14*  --   --   ALT 9*  --   --   ALKPHOS 84  --   --   BILITOT 0.4  --   --   < > = values in this interval not displayed.  Cardiac Enzymes  Recent Labs Lab 04/15/16 2032  TROPONINI 0.03*    Microbiology Results  Results for orders placed or performed during the hospital encounter of 04/15/16  Blood Culture (routine x 2)     Status: None   Collection Time: 04/15/16  8:32 PM  Result Value Ref Range Status   Specimen Description BLOOD LEFT WRIST  Final   Special Requests   Final    BOTTLES DRAWN AEROBIC AND ANAEROBIC AER ANA   Culture NO GROWTH 5 DAYS  Final   Report Status 04/20/2016 FINAL  Final  Blood Culture (routine x 2)     Status: None   Collection Time: 04/15/16  8:32 PM  Result Value Ref Range Status   Specimen Description BLOOD RIGHT WRIST  Final   Special  Requests   Final    BOTTLES DRAWN AEROBIC AND ANAEROBIC AER ANA   Culture NO GROWTH 5 DAYS  Final   Report Status 04/20/2016 FINAL  Final  Urine culture     Status: Abnormal   Collection Time: 04/15/16  8:32 PM  Result Value Ref Range Status   Specimen Description URINE, RANDOM  Final   Special Requests NONE  Final   Culture MULTIPLE SPECIES PRESENT, SUGGEST RECOLLECTION (A)  Final   Report Status 04/17/2016 FINAL  Final  Cath Tip Culture     Status: None   Collection Time: 04/16/16  1:23 PM  Result Value Ref Range Status   Specimen Description CATH TIP  Final   Special Requests NONE  Final   Culture   Final    NO GROWTH 3 DAYS Performed at [facility], [address] [place], [place] [address]    Report Status 04/20/2016 FINAL  Final    RADIOLOGY:  Dg Chest Port 1 View  Result Date: 04/18/2016 CLINICAL DATA:  UTI and sepsis EXAM: PORTABLE CHEST 1 VIEW COMPARISON:  04/15/2016 FINDINGS: Elevation the right hemidiaphragm is again seen. Cardiac shadow is stable. Hiatal hernia is again seen. No focal infiltrate or sizable effusion is seen. IMPRESSION: No acute abnormality noted. Electronically Signed   By: Alcide Clever M.D.   On: 04/18/2016 18:26    Follow up with PCP in 1 week.  Management plans discussed with the patient, family and they are in agreement.  CODE STATUS:     Code Status Orders        Start     Ordered   04/16/16 0203  Full code  Continuous  04/16/16 0202    Code Status History    Date Active Date Inactive Code Status Order ID Comments User Context   03/19/2016  3:10 PM 03/22/2016  5:15 PM Full Code [id number]  Donato HeinzJames P Hooten, MD Inpatient   01/15/2016  8:06 PM 01/16/2016  6:23 PM Full Code [id number]  Enid Baasadhika Kalisetti, MD ED   01/12/2016  3:43 PM 01/14/2016 10:03 PM Full Code [id number]  Donato HeinzJames P Hooten, MD Inpatient    Advance Directive Documentation   Flowsheet Row Most Recent Value  Type of Advance Directive  Healthcare Power of  Attorney, Living will  Pre-existing out of facility DNR order (yellow form or pink MOST form)  No data  "MOST" Form in Place?  No data      TOTAL TIME TAKING CARE OF THIS PATIENT ON DAY OF DISCHARGE: more than 30 minutes.   Milagros LollSudini, Abigial Newville R M.D on 04/21/2016 at 10:16 AM  Between 7am to 6pm - Pager - [phone]  After 6pm go to [email or web address] - password [email or web address]  [facility] [place] [facility]  Office  [phone]  CC: Primary care physician; Rafael BihariWALKER III, JOHN B, MD  Note: This dictation was prepared with Dragon dictation along with smaller phrase technology. Any transcriptional errors that result from this process are unintentional.

## 2016-04-20 NOTE — Discharge Instructions (Signed)
Full weight bearing on left foot with boot. Non - weight bearing on right foot till patient sees Dr. Ernest PineHooten

## 2016-04-20 NOTE — Progress Notes (Signed)
MEDICATION RELATED CONSULT NOTE - INITIAL   Pharmacy Consult for Electrolyte management Indication: hypocalcemic/hypophosphatemic  Allergies  Allergen Reactions  . Oxycodone Other (See Comments)    "stopped breathing"    Patient Measurements: Height: 5\' 4"  (162.6 cm) Weight: 215 lb 9 oz (97.8 kg) IBW/kg (Calculated) : 54.7 Adjusted Body Weight:   Vital Signs: Temp: 98.3 F (36.8 C) (01/30 1000) Temp Source: Oral (01/30 1000) BP: 136/63 (01/30 1000) Pulse Rate: 99 (01/30 1000) Intake/Output from previous day: 01/29 0701 - 01/30 0700 In: 480 [P.O.:480] Out: -  Intake/Output from this shift: Total I/O In: 120 [P.O.:120] Out: -   Labs:  Recent Labs  04/18/16 0503 04/19/16 0458 04/20/16 1058  WBC 12.6* 8.9  --   HGB 9.7* 8.5*  --   HCT 29.8* 25.7*  --   PLT 217 228  --   CREATININE 0.71 0.80  --   MG 1.4* 2.1  --   PHOS  --   --  1.3*   Estimated Creatinine Clearance: 71.1 mL/min (by C-G formula based on SCr of 0.8 mg/dL).   Microbiology: Recent Results (from the past 720 hour(s))  Blood Culture (routine x 2)     Status: None   Collection Time: 04/15/16  8:32 PM  Result Value Ref Range Status   Specimen Description BLOOD LEFT WRIST  Final   Special Requests   Final    BOTTLES DRAWN AEROBIC AND ANAEROBIC AER ANA   Culture NO GROWTH 5 DAYS  Final   Report Status 04/20/2016 FINAL  Final  Blood Culture (routine x 2)     Status: None   Collection Time: 04/15/16  8:32 PM  Result Value Ref Range Status   Specimen Description BLOOD RIGHT WRIST  Final   Special Requests   Final    BOTTLES DRAWN AEROBIC AND ANAEROBIC AER ANA   Culture NO GROWTH 5 DAYS  Final   Report Status 04/20/2016 FINAL  Final  Urine culture     Status: Abnormal   Collection Time: 04/15/16  8:32 PM  Result Value Ref Range Status   Specimen Description URINE, RANDOM  Final   Special Requests NONE  Final   Culture MULTIPLE SPECIES PRESENT, SUGGEST RECOLLECTION (A)  Final   Report Status 04/17/2016 FINAL  Final  Cath Tip Culture     Status: None   Collection Time: 04/16/16  1:23 PM  Result Value Ref Range Status   Specimen Description CATH TIP  Final   Special Requests NONE  Final   Culture   Final    NO GROWTH 3 DAYS Performed at [facility], [address], [place], [place] [address]    Report Status 04/20/2016 FINAL  Final    Medical History: Past Medical History:  Diagnosis Date  . Arthritis   . Coronary artery disease    a. 11/2014 CT chest w/ coronary atherosclerosis.  . Depression   . Diabetes mellitus without complication (HCC)   . Fever due to malaria    76 years old  . Fibromyalgia   . GERD (gastroesophageal reflux disease)   . History of hiatal hernia   . Hypertension   . Hypothyroidism   . Neuromuscular disorder (HCC)    Bells palsy left side of face  . Neuropathy of both feet   . Osteoarthritis    a. s/p R TKA 12/2015.  Marland Kitchen Pneumonia 1997   history of walking pneumonia  . Sleep apnea    not yet  had sleep study    Medications:  Scheduled:  . atorvastatin  40 mg Oral BH-q7a  . citalopram  20 mg Oral BH-q7a  . enoxaparin (LOVENOX) injection  40 mg Subcutaneous Q24H  . gabapentin  600 mg Oral BID  . haloperidol  0.5 mg Oral TID  . insulin aspart  0-5 Units Subcutaneous QHS  . insulin aspart  0-9 Units Subcutaneous TID WC  . levothyroxine  50 mcg Oral QAC breakfast  . LORazepam  0.5 mg Oral Once  . Melatonin  10 mg Oral QHS  . metoprolol tartrate  25 mg Oral BID  . nortriptyline  75 mg Oral QHS  . pantoprazole  40 mg Oral Daily  . potassium phosphate IVPB (mmol)  30 mmol Intravenous Once  . senna-docusate  2 tablet Oral BID  . sodium chloride flush  3 mL Intravenous Q12H  . sulfamethoxazole-trimethoprim  1 tablet Oral Q12H    Assessment: Patient admitted for delirium due to sepsis, all abx stopped now only PO bactrim. Most electrolytes WNL except phos @ 1.3 1/30 phos 1.3 1/29 Ca 8.7 1/29 Mg 2.1  Goal of  Therapy:  K 3.5 - 4.5 Ca 8.6 - 10.2 Phos 2.5 - 4.5 Mg 1.8 - 2.2  Plan:  Will give 30 mmol of Kphos IV x 1 and will recheck phos level 6 hours after dose 1/30 @ 2030. Will f/u on ionized calcium -- do not anticipate Ca to be replaced.  Thank you for this consult.  Thomasene Rippleavid Mukund Weinreb, PharmD, BCPS Clinical Pharmacist 04/20/2016

## 2016-04-20 NOTE — Progress Notes (Signed)
[facility]. Referral made aware. Thank you. Dayna BarkerKaren Robertson RN, BSN, [facility] [place], [facility] Liaison [phone] c

## 2016-04-20 NOTE — Progress Notes (Signed)
Physical Therapy Treatment Patient Details Name: Leslie Weber I Virani MRN: [id number] DOB: [birth year] Today's Date: 04/20/2016    History of Present Illness Pt is a 76 y.o. female presenting to hospital from [facility] with fevers and AMS.  Pt admitted with sepsis and UTI.  Of note, pt s/p R TKA Jan 12 2016.  [facility] with R patellar fx and L ankle fx) who attempted fixation of patella and splinted L ankle.  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  PMH includes depression, fibromyalgia, Bell's palsy, neuropathy B LE's, and hiatal hernia.    PT Comments    Pt's daughter present and had significant concerns regarding WB'ing status for pt's B LE's (pt's daughter reporting she was told something different for both LE's at recent MD appt and she wanted to hear actually WB'ing status directly from MD Ernest PineHooten (MD Hooten contacted regarding pt's daughter's request and MD confirmed B LE WB'ing status noted below).  Pt kept NWB'ing B LE's during session d/t pt's daughter's concerns and pt's daughter wanting to hear from Dr. Ernest PineHooten first before any WB'ing activity was performed.  Performed LE strengthening ex's in bed but challenging d/t pt's inconsistency following directions (confusion noted) and pt falling asleep intermittently.  Performed bed mobility with 2 assist and sitting balance (reaching within and outside BOS); pt able to briefly be SBA with reaching forward but upon return to midline pt would keep leaning backwards requiring mod to max assist to maintain sitting balance.  Pt fatigued with sitting edge of bed and started to lay back down limiting session.  Will continue to progress pt with strengthening and progressive functional mobility per pt tolerance.   Follow Up Recommendations  SNF     Equipment Recommendations   (TBD)     Recommendations for Other Services       Precautions / Restrictions Precautions Precautions: Fall Precaution Comments: R hinged knee brace locked in extension at all times Required Braces or Orthoses: Knee Immobilizer - Right Knee Immobilizer - Right: On at all times Restrictions Weight Bearing Restrictions: Yes Other Position/Activity Restrictions: Per verbal discussion with MD Hooten 04/19/16: pt NWB'ing B LE's unless working directly with Physical Therapy.  When working with physical therapy pt may be: R LE WBAT with hinged knee brace locked in extension at all times (NO knee flexion); L LE foot flat TDWB'ing in boot    Mobility  Bed Mobility Overal bed mobility: Needs Assistance Bed Mobility: Rolling;Supine to Sit;Sit to Supine Rolling: Mod assist;Max assist (logrolling to R and L)   Supine to sit: Mod assist;Max assist;+2 for physical assistance;HOB elevated Sit to supine: Mod assist;Max assist;+2 for physical assistance   General bed mobility comments: vc's for hand placement and use of side rail; assist for trunk and B LE's  Transfers                 General transfer comment: deferred transfer d/t pt with difficulty with sitting balance and also safety concerns d/t confusion  Ambulation/Gait                 Stairs            Wheelchair Mobility    Modified Rankin (Stroke Patients Only)       Balance Overall balance assessment: Needs assistance Sitting-balance support:  Bilateral upper extremity supported (L LE unsupported; R LE supported by therapist) Sitting balance-Leahy Scale: Poor Sitting balance - Comments: vc's for shifting weight forward and to R Postural control: Posterior lean;Left lateral lean                          Cognition Arousal/Alertness:  (Drowsy intermittently) Behavior During Therapy: WFL for tasks assessed/performed Overall Cognitive Status: Impaired/Different from baseline Area of Impairment:  Orientation;Following commands;Safety/judgement Orientation Level: Disoriented to;Place;Time;Situation     Following Commands: Follows one step commands inconsistently Safety/Judgement: Decreased awareness of safety;Decreased awareness of deficits          Exercises Total Joint Exercises Quad Sets: AROM;Strengthening;Both;10 reps;Supine Short Arc Quad: Strengthening;Left;10 reps;Supine;AAROM;AROM Heel Slides: AAROM;AROM;Strengthening;Left;10 reps;Supine Hip ABduction/ADduction: AAROM;AROM;Strengthening;Left;10 reps;Supine  Pt requiring vc's and tactile cues for correct technique of above ex's.    General Comments General comments (skin integrity, edema, etc.): Pt's daughter present entire session.  Pt agreeable to PT session.      Pertinent Vitals/Pain Pain Assessment: 0-10 Pain Score: 7  Pain Location: posterior L thigh (nursing notified) Pain Descriptors / Indicators: Discomfort Pain Intervention(s): Limited activity within patient's tolerance;Monitored during session;Repositioned;Patient requesting pain meds-RN notified;RN gave pain meds during session  Vitals (HR and O2 on 2 L O2 via nasal cannula) stable and WFL throughout treatment session.    Home Living                      Prior Function            PT Goals (current goals can now be found in the care plan section) Acute Rehab PT Goals Patient Stated Goal: to be able to transfer to chair PT Goal Formulation: With patient/family Time For Goal Achievement: 05/04/16 Potential to Achieve Goals: Fair Progress towards PT goals: Progressing toward goals    Frequency    7X/week      PT Plan Current plan remains appropriate    Co-evaluation             End of Session Equipment Utilized During Treatment: Oxygen (2 L/min via nasal cannula) Activity Tolerance: Patient limited by pain;Patient limited by fatigue Patient left: in bed;with call bell/phone within reach;with bed alarm set;with  family/visitor present (B LE's elevated via pillows; R heel elevated via towel roll; lab tech present taking blood and reported she would return bed to lowest position when finished)     Time: 1020-1105 PT Time Calculation (min) (ACUTE ONLY): 45 min  Charges:  $Therapeutic Exercise: 8-22 mins $Therapeutic Activity: 23-37 mins                    G CodesHendricks Limes 05/13/16, 11:37 AM Hendricks Limes, PT [phone]

## 2016-04-20 NOTE — Progress Notes (Signed)
[place] INFECTIOUS DISEASE PROGRESS NOTE Date of Admission:  04/15/2016     ID: Leslie Weber is a 76 y.o. female with fevers, sepsis Principal Problem:   Delirium due to sepsis Active Problems:   Hypothyroidism   Hypertension   HLD (hyperlipidemia)   Coronary artery disease   Type 2 diabetes with nephropathy (HCC)   Sepsis ([place])   UTI (urinary tract infection)   Major neurocognitive disorder, due to brain atrophy and cerebrovascular disease   Subjective: A little more confused overnight. No fevers.   ROS unable to obtain   Medications:  Antibiotics Given (last 72 hours)    Date/Time Action Medication Dose Rate   04/17/16 1823 Given   vancomycin (VANCOCIN) IVPB 1000 mg/200 mL premix 1,000 mg 200 mL/hr   04/17/16 2321 Given   meropenem (MERREM) IVPB SOLR 1 g 1 g 100 mL/hr   04/18/16 1120 Given   meropenem (MERREM) IVPB SOLR 1 g 1 g 100 mL/hr   04/18/16 1301 Given   vancomycin (VANCOCIN) IVPB 1000 mg/200 mL premix 1,000 mg 200 mL/hr   04/18/16 1611 Given   meropenem (MERREM) IVPB SOLR 1 g 1 g 100 mL/hr   04/19/16 0255 Given   meropenem (MERREM) IVPB SOLR 1 g 1 g 100 mL/hr   04/19/16 0526 Given   vancomycin (VANCOCIN) IVPB 1000 mg/200 mL premix 1,000 mg 200 mL/hr   04/19/16 0949 Given   meropenem (MERREM) IVPB SOLR 1 g 1 g 100 mL/hr   04/19/16 1658 Given   sulfamethoxazole-trimethoprim (BACTRIM DS,SEPTRA DS) 800-160 MG per tablet 1 tablet 1 tablet    04/19/16 2035 Given  [pt request]   sulfamethoxazole-trimethoprim (BACTRIM DS,SEPTRA DS) 800-160 MG per tablet 1 tablet 1 tablet    04/20/16 1043 Given   sulfamethoxazole-trimethoprim (BACTRIM DS,SEPTRA DS) 800-160 MG per tablet 1 tablet 1 tablet      . atorvastatin  40 mg Oral BH-q7a  . citalopram  20 mg Oral BH-q7a  . enoxaparin (LOVENOX) injection  40 mg Subcutaneous Q24H  . gabapentin  600 mg Oral BID  . haloperidol  0.5 mg Oral TID  . insulin aspart  0-5 Units Subcutaneous QHS  . insulin aspart  0-9 Units  Subcutaneous TID WC  . levothyroxine  50 mcg Oral QAC breakfast  . LORazepam  0.5 mg Oral Once  . Melatonin  10 mg Oral QHS  . metoprolol tartrate  25 mg Oral BID  . nortriptyline  75 mg Oral QHS  . pantoprazole  40 mg Oral Daily  . senna-docusate  2 tablet Oral BID  . sodium chloride flush  3 mL Intravenous Q12H  . sulfamethoxazole-trimethoprim  1 tablet Oral Q12H    Objective: Vital signs in last 24 hours: Temp:  [97.8 F (36.6 C)-98.8 F (37.1 C)] 98.3 F (36.8 C) (01/30 1000) Pulse Rate:  [85-101] 99 (01/30 1000) Resp:  [17-20] 18 (01/30 1000) BP: (136-172)/(63-92) 136/63 (01/30 1000) SpO2:  [91 %-100 %] 99 % (01/30 1000)  Physical Exam  Constitutional:  oriented to person, place, and time. appears well-developed and well-nourished. No distress.  HENT: [place]/AT, PERRLA, no scleral icterus Mouth/Throat: Oropharynx is clear and moist. No oropharyngeal exudate.  Cardiovascular: Normal rate, regular rhythm and normal heart sounds.  Pulmonary/Chest: Effort normal and breath sounds normal. No respiratory distress.  has no wheezes.  Neck = supple, no nuchal rigidity Abdominal: Soft. Bowel sounds are normal.  exhibits no distension. There is no tenderness.  Lymphadenopathy: no cervical adenopathy. No axillary adenopathy Neurological: alert  and oriented to person, place, and time.  Skin: R knee incsion cdi Psychiatric: a normal mood and affect.  behavior is normal.    Lab Results  Recent Labs  04/18/16 0503 04/18/16 1804 04/19/16 0458  WBC 12.6*  --  8.9  HGB 9.7*  --  8.5*  HCT 29.8*  --  25.7*  NA 141  --  142  K 3.2* 3.1* 3.5  CL 109  --  112*  CO2 24  --  27  BUN 17  --  18  CREATININE 0.71  --  0.80   Lab Results  Component Value Date   ESRSEDRATE 35 (H) 04/17/2016   Lab Results  Component Value Date   CRP 13.7 (H) 04/17/2016    Microbiology: Results for orders placed or performed during the hospital encounter of 04/15/16  Blood Culture (routine x 2)      Status: None   Collection Time: 04/15/16  8:32 PM  Result Value Ref Range Status   Specimen Description BLOOD LEFT WRIST  Final   Special Requests   Final    BOTTLES DRAWN AEROBIC AND ANAEROBIC AER 7ML ANA 7ML   Culture NO GROWTH 5 DAYS  Final   Report Status 04/20/2016 FINAL  Final  Blood Culture (routine x 2)     Status: None   Collection Time: 04/15/16  8:32 PM  Result Value Ref Range Status   Specimen Description BLOOD RIGHT WRIST  Final   Special Requests   Final    BOTTLES DRAWN AEROBIC AND ANAEROBIC AER 9ML ANA 10ML   Culture NO GROWTH 5 DAYS  Final   Report Status 04/20/2016 FINAL  Final  Urine culture     Status: Abnormal   Collection Time: 04/15/16  8:32 PM  Result Value Ref Range Status   Specimen Description URINE, RANDOM  Final   Special Requests NONE  Final   Culture MULTIPLE SPECIES PRESENT, SUGGEST RECOLLECTION (A)  Final   Report Status 04/17/2016 FINAL  Final  Cath Tip Culture     Status: None   Collection Time: 04/16/16  1:23 PM  Result Value Ref Range Status   Specimen Description CATH TIP  Final   Special Requests NONE  Final   Culture   Final    NO GROWTH 3 DAYS Performed at [facility], [address] [place], [place] [address]    Report Status 04/20/2016 FINAL  Final    Studies/Results: Dg Chest Port 1 View  Result Date: 04/18/2016 CLINICAL DATA:  UTI and sepsis EXAM: PORTABLE CHEST 1 VIEW COMPARISON:  04/15/2016 FINDINGS: Elevation the right hemidiaphragm is again seen. Cardiac shadow is stable. Hiatal hernia is again seen. No focal infiltrate or sizable effusion is seen. IMPRESSION: No acute abnormality noted. Electronically Signed   By: Mark  Lukens M.D.   On: 04/18/2016 18:26    Assessment/Plan: Leslie Weber is a 76 y.o. female with admission for AMS and fever/leukocytosis in setting of recent repeat R knee surgical repair and prolonged IV vanco and oral rifampin following a fall leading to wound dehisence and exposed wound.  On this  admission her flu test neg, cxr neg, UA floridly positive. PICC line has been removed and tip negative. BCX neg. UCX mixed. ESR only 35, CRP 13 I suspect all UTI related. Unfortunately UCX mixed. Day 6 of abx - changed mero to bactrim 1/29 A little more confused today Recommendations Cont  oral bactrim ds bid for 10 days total abx Monitor knee   as otpt for worsening Check cbc tomorrow and monitor for fever Thank you very much for the consult. Will follow with you.  ,  P   04/20/2016, 1:39 PM     

## 2016-04-20 NOTE — Progress Notes (Signed)
Pt removed IV this am and refuses to have a new one placed.

## 2016-04-21 LAB — BASIC METABOLIC PANEL
Anion gap: 5 (ref 5–15)
BUN: 12 mg/dL (ref 6–20)
CALCIUM: 8.9 mg/dL (ref 8.9–10.3)
CHLORIDE: 104 mmol/L (ref 101–111)
CO2: 29 mmol/L (ref 22–32)
CREATININE: 0.85 mg/dL (ref 0.44–1.00)
GFR calc non Af Amer: >60 mL/min (ref >60)
Glucose, Bld: 106 mg/dL — ABNORMAL HIGH (ref 65–99)
Potassium: 3.7 mmol/L (ref 3.5–5.1)
SODIUM: 138 mmol/L (ref 135–145)

## 2016-04-21 LAB — PHOSPHORUS: Phosphorus: 4.6 mg/dL (ref 2.5–4.6)

## 2016-04-21 LAB — CBC
HCT: 26.8 % — ABNORMAL LOW (ref 35.0–47.0)
HEMOGLOBIN: 8.8 g/dL — AB (ref 12.0–16.0)
MCH: 28.7 pg (ref 26.0–34.0)
MCHC: 33 g/dL (ref 32.0–36.0)
MCV: 87.1 fL (ref 80.0–100.0)
Platelets: 271 10*3/uL (ref 150–440)
RBC: 3.07 MIL/uL — ABNORMAL LOW (ref 3.80–5.20)
RDW: 15.8 % — AB (ref 11.5–14.5)
WBC: 11.4 10*3/uL — ABNORMAL HIGH (ref 3.6–11.0)

## 2016-04-21 LAB — MAGNESIUM: Magnesium: 1.8 mg/dL (ref 1.7–2.4)

## 2016-04-21 LAB — GLUCOSE, CAPILLARY
GLUCOSE-CAPILLARY: 97 mg/dL (ref 65–99)
GLUCOSE-CAPILLARY: 96 mg/dL (ref 65–99)
Glucose-Capillary: 125 mg/dL — ABNORMAL HIGH (ref 65–99)

## 2016-04-21 MED ORDER — HALOPERIDOL 1 MG PO TABS: 0.5000 mg | ORAL_TABLET | Freq: Two times a day (BID) | ORAL | Status: DC

## 2016-04-21 MED ORDER — GABAPENTIN 600 MG PO TABS: 300.0000 mg | ORAL_TABLET | Freq: Two times a day (BID) | ORAL | Status: AC

## 2016-04-21 MED ORDER — HALOPERIDOL 0.5 MG PO TABS: 0.5000 mg | ORAL_TABLET | Freq: Every day | ORAL | Status: AC

## 2016-04-21 MED ORDER — LORAZEPAM 0.5 MG PO TABS: 0.5000 mg | ORAL_TABLET | Freq: Every evening | ORAL | 0 refills | Status: AC | PRN

## 2016-04-21 MED ORDER — K PHOS MONO-SOD PHOS DI & MONO 155-852-130 MG PO TABS
500.0000 mg | ORAL_TABLET | ORAL | Status: AC
  Administered 2016-04-21 (×2): 500 mg via ORAL
  Filled 2016-04-21 (×2): qty 2

## 2016-04-21 MED ORDER — GABAPENTIN 600 MG PO TABS: 300.0000 mg | ORAL_TABLET | Freq: Two times a day (BID) | ORAL | Status: DC

## 2016-04-21 NOTE — Progress Notes (Signed)
Pt to  Be d/cd to  [facility] via [facility].  Alert. No resp distress/ 02 2 l [place].  Report called to elizabeth at ep.  dtr at bedside.  Braces on and intact to rt and left  Legs. Ems called and  Pt ready for transport.

## 2016-04-21 NOTE — Progress Notes (Signed)
MEDICATION RELATED CONSULT NOTE - INITIAL   Pharmacy Consult for Electrolyte management Indication: hypocalcemic/hypophosphatemic  Allergies  Allergen Reactions  . Oxycodone Other (See Comments)    "stopped breathing"    Patient Measurements: Height: 5\' 4"  (162.6 cm) Weight: 215 lb 9 oz (97.8 kg) IBW/kg (Calculated) : 54.7 Adjusted Body Weight:   Vital Signs: Temp: 97.5 F (36.4 C) (01/31 0435) Temp Source: Oral (01/31 0435) BP: 151/77 (01/31 0435) Pulse Rate: 76 (01/31 0435) Intake/Output from previous day: 01/30 0701 - 01/31 0700 In: 630 [P.O.:120; IV Piggyback:510] Out: -  Intake/Output from this shift: No intake/output data recorded.  Labs:  Recent Labs  04/19/16 0458 04/20/16 1058 04/20/16 2028 04/21/16 0445  WBC 8.9  --   --  11.4*  HGB 8.5*  --   --  8.8*  HCT 25.7*  --   --  26.8*  PLT 228  --   --  271  CREATININE 0.80  --   --  0.85  MG 2.1  --   --  1.8  PHOS  --  1.3* 2.2* 4.6   Estimated Creatinine Clearance: 66.9 mL/min (by C-G formula based on SCr of 0.85 mg/dL).   Microbiology: Recent Results (from the past 720 hour(s))  Blood Culture (routine x 2)     Status: None   Collection Time: 04/15/16  8:32 PM  Result Value Ref Range Status   Specimen Description BLOOD LEFT WRIST  Final   Special Requests   Final    BOTTLES DRAWN AEROBIC AND ANAEROBIC AER ANA   Culture NO GROWTH 5 DAYS  Final   Report Status 04/20/2016 FINAL  Final  Blood Culture (routine x 2)     Status: None   Collection Time: 04/15/16  8:32 PM  Result Value Ref Range Status   Specimen Description BLOOD RIGHT WRIST  Final   Special Requests   Final    BOTTLES DRAWN AEROBIC AND ANAEROBIC AER ANA   Culture NO GROWTH 5 DAYS  Final   Report Status 04/20/2016 FINAL  Final  Urine culture     Status: Abnormal   Collection Time: 04/15/16  8:32 PM  Result Value Ref Range Status   Specimen Description URINE, RANDOM  Final   Special Requests NONE  Final   Culture  MULTIPLE SPECIES PRESENT, SUGGEST RECOLLECTION (A)  Final   Report Status 04/17/2016 FINAL  Final  Cath Tip Culture     Status: None   Collection Time: 04/16/16  1:23 PM  Result Value Ref Range Status   Specimen Description CATH TIP  Final   Special Requests NONE  Final   Culture   Final    NO GROWTH 3 DAYS Performed at [facility], [address] [place], [place] [address]    Report Status 04/20/2016 FINAL  Final    Medical History: Past Medical History:  Diagnosis Date  . Arthritis   . Coronary artery disease    a. 11/2014 CT chest w/ coronary atherosclerosis.  . Depression   . Diabetes mellitus without complication (HCC)   . Fever due to malaria    76 years old  . Fibromyalgia   . GERD (gastroesophageal reflux disease)   . History of hiatal hernia   . Hypertension   . Hypothyroidism   . Neuromuscular disorder (HCC)    Bells palsy left side of face  . Neuropathy of both feet   . Osteoarthritis    a. s/p R TKA 12/2015.  Marland Kitchen  Pneumonia 1997   history of walking pneumonia  . Sleep apnea    not yet had sleep study    Medications:  Scheduled:  . atorvastatin  40 mg Oral BH-q7a  . citalopram  20 mg Oral BH-q7a  . enoxaparin (LOVENOX) injection  40 mg Subcutaneous Q24H  . gabapentin  600 mg Oral BID  . haloperidol  0.5 mg Oral TID  . insulin aspart  0-5 Units Subcutaneous QHS  . insulin aspart  0-9 Units Subcutaneous TID WC  . levothyroxine  50 mcg Oral QAC breakfast  . LORazepam  0.5 mg Oral Once  . Melatonin  10 mg Oral QHS  . metoprolol tartrate  25 mg Oral BID  . nortriptyline  75 mg Oral QHS  . pantoprazole  40 mg Oral Daily  . phosphorus  500 mg Oral Q4H  . senna-docusate  2 tablet Oral BID  . sodium chloride flush  3 mL Intravenous Q12H  . sulfamethoxazole-trimethoprim  1 tablet Oral Q12H    Assessment: Patient admitted for delirium due to sepsis, all abx stopped now only PO bactrim. Most electrolytes WNL except phos @ 1.3 1/30 phos 1.3 1/29 Ca  8.7 1/29 Mg 2.1  Goal of Therapy:  K 3.5 - 4.5 Ca 8.6 - 10.2 Phos 2.5 - 4.5 Mg 1.8 - 2.2  Plan:  Will give 30 mmol of Kphos IV x 1 and will recheck phos level 6 hours after dose 1/30 @ 2030. Will f/u on ionized calcium -- do not anticipate Ca to be replaced.  1/30 2053 phos 2.2, ionized calcium not resulted. Patient is allowed an in-patient diet and oral therapy appropriate. Give K Phos Neutral 500 mg Q4H x 4 doses and recheck phos with next AM labs.  1/31 0445 K 3.7, Ca 8.9, Phos 4.6, mg 1.8. No further supplement warranted. Will recheck all electrolytes with AM labs.   Thank you for this consult.  Harrison Zetina A. Dahlia Bailiffookson, PharmD, BCPS Clinical Pharmacist 04/21/2016

## 2016-04-21 NOTE — Progress Notes (Signed)
Patient is medically stable for [facility]. [facility] will go to room 207-A. RN will call report at [phone] and arrange EMS for transport. Clinical Child psychotherapistocial Worker (CSW) sent [facility]C orders [facility]. Patient and her daughter Alvino Chapelllen are aware of above. Alvino Chapelllen is at bedside. Alvino Chapelllen completed admissions paper work [facility] on yesterday. Please reconsult if future social work needs arise. CSW signing off.   Baker Hughes IncorporatedBailey Diante Barley, LCSW [phone]

## 2016-04-21 NOTE — Progress Notes (Signed)
[place] INFECTIOUS DISEASE PROGRESS NOTE Date of Admission:  04/15/2016     ID: Leslie Weber is a 76 y.o. female with fevers, sepsis Principal Problem:   Delirium due to sepsis Active Problems:   Hypothyroidism   Hypertension   HLD (hyperlipidemia)   Coronary artery disease   Type 2 diabetes with nephropathy (HCC)   Sepsis ([place])   UTI (urinary tract infection)   Major neurocognitive disorder, due to brain atrophy and cerebrovascular disease   Subjective: No fevers, less confused and seems to be at baseline per daughter at bedside  ROS 11 neg except as per HPI  Medications:  Antibiotics Given (last 72 hours)    Date/Time Action Medication Dose Rate   04/18/16 1611 Given   meropenem (MERREM) IVPB SOLR 1 g 1 g 100 mL/hr   04/19/16 0255 Given   meropenem (MERREM) IVPB SOLR 1 g 1 g 100 mL/hr   04/19/16 0526 Given   vancomycin (VANCOCIN) IVPB 1000 mg/200 mL premix 1,000 mg 200 mL/hr   04/19/16 0949 Given   meropenem (MERREM) IVPB SOLR 1 g 1 g 100 mL/hr   04/19/16 1658 Given   sulfamethoxazole-trimethoprim (BACTRIM DS,SEPTRA DS) 800-160 MG per tablet 1 tablet 1 tablet    04/19/16 2035 Given  [pt request]   sulfamethoxazole-trimethoprim (BACTRIM DS,SEPTRA DS) 800-160 MG per tablet 1 tablet 1 tablet    04/20/16 1043 Given   sulfamethoxazole-trimethoprim (BACTRIM DS,SEPTRA DS) 800-160 MG per tablet 1 tablet 1 tablet    04/21/16 1223 Given   sulfamethoxazole-trimethoprim (BACTRIM DS,SEPTRA DS) 800-160 MG per tablet 1 tablet 1 tablet      . atorvastatin  40 mg Oral BH-q7a  . citalopram  20 mg Oral BH-q7a  . enoxaparin (LOVENOX) injection  40 mg Subcutaneous Q24H  . [START ON 04/22/2016] gabapentin  300 mg Oral BID  . haloperidol  0.5 mg Oral BID  . insulin aspart  0-5 Units Subcutaneous QHS  . insulin aspart  0-9 Units Subcutaneous TID WC  . levothyroxine  50 mcg Oral QAC breakfast  . LORazepam  0.5 mg Oral Once  . Melatonin  10 mg Oral QHS  . metoprolol tartrate  25  mg Oral BID  . nortriptyline  75 mg Oral QHS  . pantoprazole  40 mg Oral Daily  . phosphorus  500 mg Oral Q4H  . senna-docusate  2 tablet Oral BID  . sodium chloride flush  3 mL Intravenous Q12H  . sulfamethoxazole-trimethoprim  1 tablet Oral Q12H    Objective: Vital signs in last 24 hours: Temp:  [97.4 F (36.3 C)-97.5 F (36.4 C)] 97.5 F (36.4 C) (01/31 0435) Pulse Rate:  [68-76] 76 (01/31 0435) Resp:  [16-17] 16 (01/31 0435) BP: (144-152)/(77-90) 151/77 (01/31 0435) SpO2:  [99 %-100 %] 100 % (01/31 0435)  Physical Exam  Constitutional:  oriented to person, place, and time. appears well-developed and well-nourished. No distress.  HENT: [place]/AT, PERRLA, no scleral icterus Mouth/Throat: Oropharynx is clear and moist. No oropharyngeal exudate.  Cardiovascular: Normal rate, regular rhythm and normal heart sounds.  Pulmonary/Chest: Effort normal and breath sounds normal. No respiratory distress.  has no wheezes.  Neck = supple, no nuchal rigidity Abdominal: Soft. Bowel sounds are normal.  exhibits no distension. There is no tenderness.  Lymphadenopathy: no cervical adenopathy. No axillary adenopathy Neurological: alert and oriented to person, place, and time.  Skin: R knee incsion cdi Psychiatric: a normal mood and affect.  behavior is normal.    Lab Results  Recent Labs  04/19/16 0458 04/21/16 0445  WBC 8.9 11.4*  HGB 8.5* 8.8*  HCT 25.7* 26.8*  NA 142 138  K 3.5 3.7  CL 112* 104  CO2 27 29  BUN 18 12  CREATININE 0.80 0.85   Lab Results  Component Value Date   ESRSEDRATE 35 (H) 04/17/2016   Lab Results  Component Value Date   CRP 13.7 (H) 04/17/2016    Microbiology: Results for orders placed or performed during the hospital encounter of 04/15/16  Blood Culture (routine x 2)     Status: None   Collection Time: 04/15/16  8:32 PM  Result Value Ref Range Status   Specimen Description BLOOD LEFT WRIST  Final   Special Requests   Final    BOTTLES DRAWN AEROBIC  AND ANAEROBIC AER 7ML ANA 7ML   Culture NO GROWTH 5 DAYS  Final   Report Status 04/20/2016 FINAL  Final  Blood Culture (routine x 2)     Status: None   Collection Time: 04/15/16  8:32 PM  Result Value Ref Range Status   Specimen Description BLOOD RIGHT WRIST  Final   Special Requests   Final    BOTTLES DRAWN AEROBIC AND ANAEROBIC AER 9ML ANA 10ML   Culture NO GROWTH 5 DAYS  Final   Report Status 04/20/2016 FINAL  Final  Urine culture     Status: Abnormal   Collection Time: 04/15/16  8:32 PM  Result Value Ref Range Status   Specimen Description URINE, RANDOM  Final   Special Requests NONE  Final   Culture MULTIPLE SPECIES PRESENT, SUGGEST RECOLLECTION (A)  Final   Report Status 04/17/2016 FINAL  Final  Cath Tip Culture     Status: None   Collection Time: 04/16/16  1:23 PM  Result Value Ref Range Status   Specimen Description CATH TIP  Final   Special Requests NONE  Final   Culture   Final    NO GROWTH 3 DAYS Performed at [facility], [place] [address], [place], [place] [address]    Report Status 04/20/2016 FINAL  Final    Studies/Results: No results found.  Assessment/Plan: Leslie Weber is a 76 y.o. female with admission for AMS and fever/leukocytosis in setting of recent repeat R knee surgical repair and prolonged IV vanco and oral rifampin following a fall leading to wound dehisence and exposed wound.  On this admission her flu test neg, cxr neg, UA floridly positive. PICC line has been removed and tip negative. [place] neg. UCX mixed. ESR only 35, CRP 13 I suspect all UTI related. Unfortunately UCX mixed. Day 7 of abx - changed mero to bactrim 1/29  Recommendations Cont  oral bactrim ds bid for 10 days total abx Monitor knee as otpt for worsening She will be dced to [facility]. I will be glad to see if she worsens but do not need a dedicated follow up with me unless Dr Marry Guan feels it is necessary.  Thank you very much for the consult. Will follow with you.  FITZGERALD,  DAVID P   04/21/2016, 1:33 PM

## 2016-04-21 NOTE — Progress Notes (Signed)
Patient arousable but was very lethargic early this morning and was not awake enough to take medication. Tried to administer on 2 different occassions this morning to see if she would take sips of water and was too sleepy. Patient would awaken but would go right back to sleep. Will continue to monitor patient to end of shift.

## 2016-04-21 NOTE — Progress Notes (Signed)
MEDICATION RELATED CONSULT NOTE - INITIAL   Pharmacy Consult for Electrolyte management Indication: hypocalcemic/hypophosphatemic  Allergies  Allergen Reactions  . Oxycodone Other (See Comments)    "stopped breathing"    Patient Measurements: Height: 5\' 4"  (162.6 cm) Weight: 215 lb 9 oz (97.8 kg) IBW/kg (Calculated) : 54.7 Adjusted Body Weight:   Vital Signs: Temp: 97.4 F (36.3 C) (01/30 2023) Temp Source: Oral (01/30 2023) BP: 152/77 (01/30 2023) Pulse Rate: 68 (01/30 2023) Intake/Output from previous day: 01/30 0701 - 01/31 0700 In: 120 [P.O.:120] Out: -  Intake/Output from this shift: No intake/output data recorded.  Labs:  Recent Labs  04/18/16 0503 04/19/16 0458 04/20/16 1058 04/20/16 2028  WBC 12.6* 8.9  --   --   HGB 9.7* 8.5*  --   --   HCT 29.8* 25.7*  --   --   PLT 217 228  --   --   CREATININE 0.71 0.80  --   --   MG 1.4* 2.1  --   --   PHOS  --   --  1.3* 2.2*   Estimated Creatinine Clearance: 71.1 mL/min (by C-G formula based on SCr of 0.8 mg/dL).   Microbiology: Recent Results (from the past 720 hour(s))  Blood Culture (routine x 2)     Status: None   Collection Time: 04/15/16  8:32 PM  Result Value Ref Range Status   Specimen Description BLOOD LEFT WRIST  Final   Special Requests   Final    BOTTLES DRAWN AEROBIC AND ANAEROBIC AER ANA   Culture NO GROWTH 5 DAYS  Final   Report Status 04/20/2016 FINAL  Final  Blood Culture (routine x 2)     Status: None   Collection Time: 04/15/16  8:32 PM  Result Value Ref Range Status   Specimen Description BLOOD RIGHT WRIST  Final   Special Requests   Final    BOTTLES DRAWN AEROBIC AND ANAEROBIC AER ANA   Culture NO GROWTH 5 DAYS  Final   Report Status 04/20/2016 FINAL  Final  Urine culture     Status: Abnormal   Collection Time: 04/15/16  8:32 PM  Result Value Ref Range Status   Specimen Description URINE, RANDOM  Final   Special Requests NONE  Final   Culture MULTIPLE SPECIES  PRESENT, SUGGEST RECOLLECTION (A)  Final   Report Status 04/17/2016 FINAL  Final  Cath Tip Culture     Status: None   Collection Time: 04/16/16  1:23 PM  Result Value Ref Range Status   Specimen Description CATH TIP  Final   Special Requests NONE  Final   Culture   Final    NO GROWTH 3 DAYS Performed at [facility], [address], [place], [place] [address]    Report Status 04/20/2016 FINAL  Final    Medical History: Past Medical History:  Diagnosis Date  . Arthritis   . Coronary artery disease    a. 11/2014 CT chest w/ coronary atherosclerosis.  . Depression   . Diabetes mellitus without complication (HCC)   . Fever due to malaria    76 years old  . Fibromyalgia   . GERD (gastroesophageal reflux disease)   . History of hiatal hernia   . Hypertension   . Hypothyroidism   . Neuromuscular disorder (HCC)    Bells palsy left side of face  . Neuropathy of both feet   . Osteoarthritis    a. s/p R TKA 12/2015.  Marland Kitchen  Pneumonia 1997   history of walking pneumonia  . Sleep apnea    not yet had sleep study    Medications:  Scheduled:  . atorvastatin  40 mg Oral BH-q7a  . citalopram  20 mg Oral BH-q7a  . enoxaparin (LOVENOX) injection  40 mg Subcutaneous Q24H  . gabapentin  600 mg Oral BID  . haloperidol  0.5 mg Oral TID  . insulin aspart  0-5 Units Subcutaneous QHS  . insulin aspart  0-9 Units Subcutaneous TID WC  . levothyroxine  50 mcg Oral QAC breakfast  . LORazepam  0.5 mg Oral Once  . Melatonin  10 mg Oral QHS  . metoprolol tartrate  25 mg Oral BID  . nortriptyline  75 mg Oral QHS  . pantoprazole  40 mg Oral Daily  . potassium phosphate IVPB (mmol)  30 mmol Intravenous Once  . senna-docusate  2 tablet Oral BID  . sodium chloride flush  3 mL Intravenous Q12H  . sulfamethoxazole-trimethoprim  1 tablet Oral Q12H    Assessment: Patient admitted for delirium due to sepsis, all abx stopped now only PO bactrim. Most electrolytes WNL except phos @ 1.3 1/30 phos  1.3 1/29 Ca 8.7 1/29 Mg 2.1  Goal of Therapy:  K 3.5 - 4.5 Ca 8.6 - 10.2 Phos 2.5 - 4.5 Mg 1.8 - 2.2  Plan:  Will give 30 mmol of Kphos IV x 1 and will recheck phos level 6 hours after dose 1/30 @ 2030. Will f/u on ionized calcium -- do not anticipate Ca to be replaced.  1/30 2053 phos 2.2, ionized calcium not resulted. Patient is allowed an in-patient diet and oral therapy appropriate. Give K Phos Neutral 500 mg Q4H x 4 doses and recheck phos with next AM labs.   Thank you for this consult.  Maysen Sudol A. Dahlia Bailiffookson, PharmD, BCPS Clinical Pharmacist 04/21/2016

## 2016-04-21 NOTE — Progress Notes (Signed)
SUBJECTIVE: The patient was asleep and difficult to arouse. History of poor sleep may be contributing to patient's disorientation so the patient was not forced to wake up.    Vitals:   04/20/16 1000 04/20/16 1704 04/20/16 2023 04/21/16 0435  BP: 136/63 (!) 144/90 (!) 152/77 (!) 151/77  Pulse: 99 72 68 76  Resp: 18  17 16   Temp: 98.3 F (36.8 C) 97.4 F (36.3 C) 97.4 F (36.3 C) 97.5 F (36.4 C)  TempSrc: Oral Oral Oral Oral  SpO2: 99% 99% 100% 100%  Weight:      Height:        Intake/Output Summary (Last 24 hours) at 04/21/16 0938 Last data filed at 04/21/16 0300  Gross per 24 hour  Intake              630 ml  Output                0 ml  Net              630 ml    LABS: Basic Metabolic Panel:  Recent Labs  16/10/96 0458  04/20/16 2028 04/21/16 0445  NA 142  --   --  138  K 3.5  --   --  3.7  CL 112*  --   --  104  CO2 27  --   --  29  GLUCOSE 92  --   --  106*  BUN 18  --   --  12  CREATININE 0.80  --   --  0.85  CALCIUM 8.7*  --   --  8.9  MG 2.1  --   --  1.8  PHOS  --   < > 2.2* 4.6  < > = values in this interval not displayed. Liver Function Tests: No results for input(s): AST, ALT, ALKPHOS, BILITOT, PROT, ALBUMIN in the last 72 hours. No results for input(s): LIPASE, AMYLASE in the last 72 hours. CBC:  Recent Labs  04/19/16 0458 04/21/16 0445  WBC 8.9 11.4*  HGB 8.5* 8.8*  HCT 25.7* 26.8*  MCV 88.2 87.1  PLT 228 271   Cardiac Enzymes: No results for input(s): CKTOTAL, CKMB, CKMBINDEX, TROPONINI in the last 72 hours. BNP: Invalid input(s): POCBNP D-Dimer: No results for input(s): DDIMER in the last 72 hours. Hemoglobin A1C: No results for input(s): HGBA1C in the last 72 hours. Fasting Lipid Panel: No results for input(s): CHOL, HDL, LDLCALC, TRIG, CHOLHDL, LDLDIRECT in the last 72 hours. Thyroid Function Tests:  Recent Labs  04/18/16 1135  TSH 6.700*   Anemia Panel:  Recent Labs  04/18/16 1135  VITAMINB12 360     PHYSICAL  EXAM General: Pale appearing. In no acute distress.  HEENT:  Normocephalic and atramatic Neck:  No JVD.  Lungs: Clear bilaterally to auscultation and percussion. Heart: HRRR . Normal S1 and S2 without gallops or murmurs.  Abdomen: Bowel sounds are positive, abdomen soft and non-tender  Msk:  Back normal, normal gait. Normal strength and tone for age. Extremities: No clubbing, cyanosis or edema.   Neuro: Asleep difficult to arouse. Psych:  Asleep, difficult to arouse.  TELEMETRY: Not available  ASSESSMENT AND PLAN: Pt is currently being treated for UTI and sepsis with reported chest pain during admission. Pts heart rate was 80bpm, regular rate and rhythm. Continues on 2L oxygen. She is resting comfortably and chest pain had resolved as of yesterday. Continue low dose metoprolol and she is cleared for discharge with close outpatient follow  up from cardiac perspective.   Principal Problem:   Delirium due to sepsis Active Problems:   Hypothyroidism   Hypertension   HLD (hyperlipidemia)   Coronary artery disease   Type 2 diabetes with nephropathy (HCC)   Sepsis (HCC)   UTI (urinary tract infection)   Major neurocognitive disorder, due to brain atrophy and cerebrovascular disease    Caroleen HammanKristin Wolfe Camarena, NP-C 04/21/2016 9:38 AM

## 2016-04-21 NOTE — Progress Notes (Signed)
Pt [facility].

## 2016-04-21 NOTE — Consult Note (Signed)
ORTHOPAEDICS: I evaluated the patient yesterday. No family was present.  The patient was awake and alert. She denied any significant knee or ankle pain.  Right knee range of motion brace was intact to the right lower extremity and locked in extension. The Cam Dan HumphreysWalker was in place to the left lower extremity.  Examination of the right knee showed the surgical incision to be well approximated. No erythema or drainage. The patient was able to perform independent straight leg raise with the brace in place. Neurovascular function was intact.  I previously spoke with physical therapy and shared my recommendations with the patient. She may be weightbearing as tolerated to the right lower extremity with the knee brace locked in extension under the supervision of physical therapy. She may be foot flat touchdown weightbearing to the left lower extremity with the Cam Walker in place. The patient expressed her understanding of these recommendations.  Leslie Weber P. Angie FavaHooten, Jr. M.D.

## 2016-04-21 NOTE — Clinical Social Work Placement (Signed)
   CLINICAL SOCIAL WORK PLACEMENT  NOTE  Date:  04/21/2016  Patient Details  Name: Leslie Weber MRN: [id number] Date of Birth: 1941-03-13  Clinical Social Work is seeking post-discharge placement for this patient at the Skilled  Nursing Facility level of care (*CSW will initial, date and re-position this form in  chart as items are completed):  Yes   Patient/family provided with [facility] Clinical Social Work Department's list of facilities offering this level of care within the geographic area requested by the patient (or if unable, by the patient's family).  Yes   Patient/family informed of their freedom to choose among providers that offer the needed level of care, that participate in Medicare, Medicaid or managed care program needed by the patient, have an available bed and are willing to accept the patient.  Yes   Patient/family informed of [facility]'s ownership interest in [facility] and [facility], as well as of the fact that they are under no obligation to receive care at these facilities.  PASRR submitted to EDS on       PASRR number received on       Existing PASRR number confirmed on 04/18/16     FL2 transmitted to all facilities in geographic area requested by pt/family on 04/18/16     FL2 transmitted to all facilities within larger geographic area on       Patient informed that his/her managed care company has contracts with or will negotiate with certain facilities, including the following:        Yes   Patient/family informed of bed offers received.  Patient chooses bed at  [facility] )     Physician recommends and patient chooses bed at      Patient to be transferred to  [facility] ) on 04/21/16.  Patient to be transferred to facility by  [facility] )     Patient family notified on 04/21/16 of transfer.  Name of family member notified:   (Patient's daughter Leslie Weber is at bedside and aware of D/C today. )     PHYSICIAN        Additional Comment:    _______________________________________________ Valine Drozdowski, Darleen CrockerBailey M, LCSW 04/21/2016, 12:29 PM

## 2016-04-22 ENCOUNTER — Encounter
Admission: RE | Admit: 2016-04-22 | Discharge: 2016-04-22 | Disposition: A | Discharge status: Home / Self Care | Payer: Medicare Other | Source: Ambulatory Visit | Attending: Internal Medicine | Admitting: Internal Medicine

## 2016-04-22 DIAGNOSIS — R410 Disorientation, unspecified: Secondary | ICD-10-CM | POA: Insufficient documentation

## 2016-04-22 DIAGNOSIS — D62 Acute posthemorrhagic anemia: Secondary | ICD-10-CM | POA: Insufficient documentation

## 2016-04-22 DIAGNOSIS — R5381 Other malaise: Secondary | ICD-10-CM | POA: Diagnosis not present

## 2016-04-22 LAB — GLUCOSE, CAPILLARY
GLUCOSE-CAPILLARY: 116 mg/dL — AB (ref 65–99)
GLUCOSE-CAPILLARY: 134 mg/dL — AB (ref 65–99)
GLUCOSE-CAPILLARY: 149 mg/dL — AB (ref 65–99)
Glucose-Capillary: 174 mg/dL — ABNORMAL HIGH (ref 65–99)

## 2016-04-22 LAB — CALCIUM, IONIZED: CALCIUM, IONIZED, SERUM: 5.4 mg/dL (ref 4.5–5.6)

## 2016-04-23 DIAGNOSIS — R5381 Other malaise: Secondary | ICD-10-CM | POA: Diagnosis not present

## 2016-04-23 LAB — GLUCOSE, CAPILLARY
GLUCOSE-CAPILLARY: 105 mg/dL — AB (ref 65–99)
GLUCOSE-CAPILLARY: 198 mg/dL — AB (ref 65–99)
Glucose-Capillary: 103 mg/dL — ABNORMAL HIGH (ref 65–99)
Glucose-Capillary: 163 mg/dL — ABNORMAL HIGH (ref 65–99)

## 2016-04-24 DIAGNOSIS — R5381 Other malaise: Secondary | ICD-10-CM | POA: Diagnosis not present

## 2016-04-24 LAB — CBC WITH DIFFERENTIAL/PLATELET
BASOS ABS: 0.1 10*3/uL (ref 0–0.1)
BASOS PCT: 1 %
Eosinophils Absolute: 0.5 10*3/uL (ref 0–0.7)
Eosinophils Relative: 5 %
HCT: 27 % — ABNORMAL LOW (ref 35.0–47.0)
HEMOGLOBIN: 9 g/dL — AB (ref 12.0–16.0)
Lymphocytes Relative: 16 %
Lymphs Abs: 1.6 10*3/uL (ref 1.0–3.6)
MCH: 29.2 pg (ref 26.0–34.0)
MCHC: 33.2 g/dL (ref 32.0–36.0)
MCV: 87.9 fL (ref 80.0–100.0)
Monocytes Absolute: 1 10*3/uL — ABNORMAL HIGH (ref 0.2–0.9)
Monocytes Relative: 10 %
NEUTROS PCT: 68 %
Neutro Abs: 6.9 10*3/uL — ABNORMAL HIGH (ref 1.4–6.5)
Platelets: 351 10*3/uL (ref 150–440)
RBC: 3.07 MIL/uL — AB (ref 3.80–5.20)
RDW: 15.9 % — ABNORMAL HIGH (ref 11.5–14.5)
WBC: 10 10*3/uL (ref 3.6–11.0)

## 2016-04-24 LAB — COMPREHENSIVE METABOLIC PANEL
ALBUMIN: 3 g/dL — AB (ref 3.5–5.0)
ALK PHOS: 67 U/L (ref 38–126)
ALT: 13 U/L — AB (ref 14–54)
ANION GAP: 5 (ref 5–15)
AST: 19 U/L (ref 15–41)
BUN: 19 mg/dL (ref 6–20)
CALCIUM: 9.5 mg/dL (ref 8.9–10.3)
CO2: 30 mmol/L (ref 22–32)
Chloride: 104 mmol/L (ref 101–111)
Creatinine, Ser: 1.22 mg/dL — ABNORMAL HIGH (ref 0.44–1.00)
GFR calc Af Amer: 50 mL/min — ABNORMAL LOW (ref >60)
GFR calc non Af Amer: 43 mL/min — ABNORMAL LOW (ref >60)
GLUCOSE: 97 mg/dL (ref 65–99)
Potassium: 4.1 mmol/L (ref 3.5–5.1)
SODIUM: 139 mmol/L (ref 135–145)
Total Bilirubin: 0.3 mg/dL (ref 0.3–1.2)
Total Protein: 5.7 g/dL — ABNORMAL LOW (ref 6.5–8.1)

## 2016-04-24 LAB — GLUCOSE, CAPILLARY
GLUCOSE-CAPILLARY: 100 mg/dL — AB (ref 65–99)
GLUCOSE-CAPILLARY: 124 mg/dL — AB (ref 65–99)
GLUCOSE-CAPILLARY: 172 mg/dL — AB (ref 65–99)
Glucose-Capillary: 92 mg/dL (ref 65–99)

## 2016-04-24 LAB — INFLUENZA PANEL BY PCR (TYPE A & B)
Influenza A By PCR: NEGATIVE
Influenza B By PCR: NEGATIVE

## 2016-04-25 DIAGNOSIS — R5381 Other malaise: Secondary | ICD-10-CM | POA: Diagnosis not present

## 2016-04-25 LAB — GLUCOSE, CAPILLARY
GLUCOSE-CAPILLARY: 111 mg/dL — AB (ref 65–99)
GLUCOSE-CAPILLARY: 133 mg/dL — AB (ref 65–99)
Glucose-Capillary: 95 mg/dL (ref 65–99)
Glucose-Capillary: 116 mg/dL — ABNORMAL HIGH (ref 65–99)

## 2016-04-26 DIAGNOSIS — R5381 Other malaise: Secondary | ICD-10-CM | POA: Diagnosis not present

## 2016-04-27 LAB — GLUCOSE, CAPILLARY
GLUCOSE-CAPILLARY: 89 mg/dL (ref 65–99)
GLUCOSE-CAPILLARY: 173 mg/dL — AB (ref 65–99)
GLUCOSE-CAPILLARY: 116 mg/dL — AB (ref 65–99)
GLUCOSE-CAPILLARY: 102 mg/dL — AB (ref 65–99)
Glucose-Capillary: 94 mg/dL (ref 65–99)
Glucose-Capillary: 105 mg/dL — ABNORMAL HIGH (ref 65–99)
Glucose-Capillary: 107 mg/dL — ABNORMAL HIGH (ref 65–99)
Glucose-Capillary: 117 mg/dL — ABNORMAL HIGH (ref 65–99)

## 2016-04-28 ENCOUNTER — Emergency Department: Payer: Medicare Other

## 2016-04-28 ENCOUNTER — Non-Acute Institutional Stay (SKILLED_NURSING_FACILITY): Payer: Medicare Other | Admitting: Gerontology

## 2016-04-28 ENCOUNTER — Inpatient Hospital Stay
Admission: EM | Admit: 2016-04-28 | Discharge: 2016-05-01 | DRG: 378 | Disposition: A | Discharge status: Home / Self Care | Payer: Medicare Other | Attending: Internal Medicine | Admitting: Internal Medicine

## 2016-04-28 DIAGNOSIS — R410 Disorientation, unspecified: Secondary | ICD-10-CM

## 2016-04-28 DIAGNOSIS — Z90710 Acquired absence of both cervix and uterus: Secondary | ICD-10-CM | POA: Diagnosis not present

## 2016-04-28 DIAGNOSIS — E1121 Type 2 diabetes mellitus with diabetic nephropathy: Secondary | ICD-10-CM | POA: Diagnosis present

## 2016-04-28 DIAGNOSIS — E785 Hyperlipidemia, unspecified: Secondary | ICD-10-CM | POA: Diagnosis present

## 2016-04-28 DIAGNOSIS — D62 Acute posthemorrhagic anemia: Secondary | ICD-10-CM | POA: Diagnosis present

## 2016-04-28 DIAGNOSIS — K921 Melena: Secondary | ICD-10-CM | POA: Diagnosis present

## 2016-04-28 DIAGNOSIS — G4730 Sleep apnea, unspecified: Secondary | ICD-10-CM | POA: Diagnosis present

## 2016-04-28 DIAGNOSIS — Z79899 Other long term (current) drug therapy: Secondary | ICD-10-CM | POA: Diagnosis not present

## 2016-04-28 DIAGNOSIS — L89890 Pressure ulcer of other site, unstageable: Secondary | ICD-10-CM | POA: Diagnosis present

## 2016-04-28 DIAGNOSIS — L8990 Pressure ulcer of unspecified site, unspecified stage: Secondary | ICD-10-CM | POA: Insufficient documentation

## 2016-04-28 DIAGNOSIS — K922 Gastrointestinal hemorrhage, unspecified: Secondary | ICD-10-CM | POA: Diagnosis present

## 2016-04-28 DIAGNOSIS — K219 Gastro-esophageal reflux disease without esophagitis: Secondary | ICD-10-CM | POA: Diagnosis present

## 2016-04-28 DIAGNOSIS — R296 Repeated falls: Secondary | ICD-10-CM | POA: Diagnosis present

## 2016-04-28 DIAGNOSIS — Z96651 Presence of right artificial knee joint: Secondary | ICD-10-CM | POA: Diagnosis present

## 2016-04-28 DIAGNOSIS — Z1621 Resistance to vancomycin: Secondary | ICD-10-CM | POA: Diagnosis present

## 2016-04-28 DIAGNOSIS — F0150 Vascular dementia without behavioral disturbance: Secondary | ICD-10-CM | POA: Diagnosis present

## 2016-04-28 DIAGNOSIS — Z6832 Body mass index (BMI) 32.0-32.9, adult: Secondary | ICD-10-CM | POA: Diagnosis not present

## 2016-04-28 DIAGNOSIS — Z8701 Personal history of pneumonia (recurrent): Secondary | ICD-10-CM | POA: Diagnosis not present

## 2016-04-28 DIAGNOSIS — F02B18 Dementia in other diseases classified elsewhere, moderate, with other behavioral disturbance: Secondary | ICD-10-CM | POA: Diagnosis present

## 2016-04-28 DIAGNOSIS — E039 Hypothyroidism, unspecified: Secondary | ICD-10-CM | POA: Diagnosis present

## 2016-04-28 DIAGNOSIS — Z800 Family history of malignant neoplasm of digestive organs: Secondary | ICD-10-CM

## 2016-04-28 DIAGNOSIS — M797 Fibromyalgia: Secondary | ICD-10-CM | POA: Diagnosis present

## 2016-04-28 DIAGNOSIS — N390 Urinary tract infection, site not specified: Secondary | ICD-10-CM | POA: Diagnosis present

## 2016-04-28 DIAGNOSIS — B952 Enterococcus as the cause of diseases classified elsewhere: Secondary | ICD-10-CM | POA: Diagnosis present

## 2016-04-28 DIAGNOSIS — F0281 Dementia in other diseases classified elsewhere with behavioral disturbance: Secondary | ICD-10-CM | POA: Diagnosis present

## 2016-04-28 DIAGNOSIS — I679 Cerebrovascular disease, unspecified: Secondary | ICD-10-CM | POA: Diagnosis present

## 2016-04-28 DIAGNOSIS — G319 Degenerative disease of nervous system, unspecified: Secondary | ICD-10-CM | POA: Diagnosis present

## 2016-04-28 DIAGNOSIS — F418 Other specified anxiety disorders: Secondary | ICD-10-CM | POA: Diagnosis present

## 2016-04-28 DIAGNOSIS — I2510 Atherosclerotic heart disease of native coronary artery without angina pectoris: Secondary | ICD-10-CM | POA: Diagnosis present

## 2016-04-28 DIAGNOSIS — I10 Essential (primary) hypertension: Secondary | ICD-10-CM | POA: Diagnosis present

## 2016-04-28 DIAGNOSIS — N179 Acute kidney failure, unspecified: Secondary | ICD-10-CM | POA: Diagnosis present

## 2016-04-28 DIAGNOSIS — E1140 Type 2 diabetes mellitus with diabetic neuropathy, unspecified: Secondary | ICD-10-CM | POA: Diagnosis present

## 2016-04-28 DIAGNOSIS — Z801 Family history of malignant neoplasm of trachea, bronchus and lung: Secondary | ICD-10-CM

## 2016-04-28 DIAGNOSIS — K449 Diaphragmatic hernia without obstruction or gangrene: Secondary | ICD-10-CM | POA: Diagnosis present

## 2016-04-28 DIAGNOSIS — K3189 Other diseases of stomach and duodenum: Secondary | ICD-10-CM | POA: Diagnosis present

## 2016-04-28 LAB — URINALYSIS, COMPLETE (UACMP) WITH MICROSCOPIC
BACTERIA UA: NONE SEEN
BILIRUBIN URINE: NEGATIVE
Glucose, UA: NEGATIVE mg/dL
Hgb urine dipstick: NEGATIVE
KETONES UR: NEGATIVE mg/dL
NITRITE: NEGATIVE
PH: 5 (ref 5.0–8.0)
Protein, ur: NEGATIVE mg/dL
SPECIFIC GRAVITY, URINE: 1.015 (ref 1.005–1.030)

## 2016-04-28 LAB — CBC WITH DIFFERENTIAL/PLATELET
BASOS PCT: 1 %
Basophils Absolute: 0.1 10*3/uL (ref 0–0.1)
Basophils Absolute: 0.1 10*3/uL (ref 0–0.1)
Basophils Relative: 1 %
EOS ABS: 0.3 10*3/uL (ref 0–0.7)
Eosinophils Absolute: 0.4 10*3/uL (ref 0–0.7)
Eosinophils Relative: 3 %
Eosinophils Relative: 2 %
HEMATOCRIT: 18.3 % — AB (ref 35.0–47.0)
HEMATOCRIT: 17.3 % — AB (ref 35.0–47.0)
HEMOGLOBIN: 5.7 g/dL — AB (ref 12.0–16.0)
Hemoglobin: 5.8 g/dL — ABNORMAL LOW (ref 12.0–16.0)
LYMPHS PCT: 11 %
Lymphocytes Relative: 17 %
Lymphs Abs: 2 10*3/uL (ref 1.0–3.6)
Lymphs Abs: 1.8 10*3/uL (ref 1.0–3.6)
MCH: 28.1 pg (ref 26.0–34.0)
MCH: 29 pg (ref 26.0–34.0)
MCHC: 31.7 g/dL — AB (ref 32.0–36.0)
MCHC: 32.9 g/dL (ref 32.0–36.0)
MCV: 88.7 fL (ref 80.0–100.0)
MCV: 88.1 fL (ref 80.0–100.0)
MONO ABS: 1.2 10*3/uL — AB (ref 0.2–0.9)
MONO ABS: 1.4 10*3/uL — AB (ref 0.2–0.9)
MONOS PCT: 10 %
MONOS PCT: 9 %
NEUTROS ABS: 8 10*3/uL — AB (ref 1.4–6.5)
NEUTROS ABS: 12.3 10*3/uL — AB (ref 1.4–6.5)
NEUTROS PCT: 77 %
Neutrophils Relative %: 69 %
Platelets: 354 10*3/uL (ref 150–440)
Platelets: 348 10*3/uL (ref 150–440)
RBC: 2.06 MIL/uL — ABNORMAL LOW (ref 3.80–5.20)
RBC: 1.96 MIL/uL — ABNORMAL LOW (ref 3.80–5.20)
RDW: 16.2 % — ABNORMAL HIGH (ref 11.5–14.5)
RDW: 16.1 % — AB (ref 11.5–14.5)
WBC: 11.6 10*3/uL — ABNORMAL HIGH (ref 3.6–11.0)
WBC: 16 10*3/uL — ABNORMAL HIGH (ref 3.6–11.0)

## 2016-04-28 LAB — COMPREHENSIVE METABOLIC PANEL
ALBUMIN: 2.9 g/dL — AB (ref 3.5–5.0)
ALT: 12 U/L — ABNORMAL LOW (ref 14–54)
AST: 16 U/L (ref 15–41)
Alkaline Phosphatase: 50 U/L (ref 38–126)
Anion gap: 5 (ref 5–15)
BILIRUBIN TOTAL: 0.3 mg/dL (ref 0.3–1.2)
BUN: 72 mg/dL — AB (ref 6–20)
CO2: 30 mmol/L (ref 22–32)
Calcium: 10.2 mg/dL (ref 8.9–10.3)
Chloride: 102 mmol/L (ref 101–111)
Creatinine, Ser: 1.7 mg/dL — ABNORMAL HIGH (ref 0.44–1.00)
GFR calc Af Amer: 33 mL/min — ABNORMAL LOW (ref >60)
GFR calc non Af Amer: 29 mL/min — ABNORMAL LOW (ref >60)
GLUCOSE: 103 mg/dL — AB (ref 65–99)
POTASSIUM: 5.4 mmol/L — AB (ref 3.5–5.1)
SODIUM: 137 mmol/L (ref 135–145)
TOTAL PROTEIN: 5.2 g/dL — AB (ref 6.5–8.1)

## 2016-04-28 LAB — URINE DRUG SCREEN, QUALITATIVE (ARMC ONLY)
AMPHETAMINES, UR SCREEN: NOT DETECTED
BARBITURATES, UR SCREEN: NOT DETECTED
BENZODIAZEPINE, UR SCRN: NOT DETECTED
Cannabinoid 50 Ng, Ur ~~LOC~~: NOT DETECTED
Cocaine Metabolite,Ur ~~LOC~~: NOT DETECTED
MDMA (Ecstasy)Ur Screen: NOT DETECTED
METHADONE SCREEN, URINE: NOT DETECTED
OPIATE, UR SCREEN: NOT DETECTED
Phencyclidine (PCP) Ur S: NOT DETECTED
TRICYCLIC, UR SCREEN: POSITIVE — AB

## 2016-04-28 LAB — LIPID PANEL
Cholesterol: 106 mg/dL (ref 0–200)
HDL: 27 mg/dL — ABNORMAL LOW (ref >40)
LDL CALC: 22 mg/dL (ref 0–99)
TRIGLYCERIDES: 287 mg/dL — AB (ref <150)
Total CHOL/HDL Ratio: 3.9 RATIO
VLDL: 57 mg/dL — ABNORMAL HIGH (ref 0–40)

## 2016-04-28 LAB — BASIC METABOLIC PANEL
Anion gap: 6 (ref 5–15)
BUN: 77 mg/dL — ABNORMAL HIGH (ref 6–20)
CALCIUM: 10 mg/dL (ref 8.9–10.3)
CHLORIDE: 104 mmol/L (ref 101–111)
CO2: 27 mmol/L (ref 22–32)
CREATININE: 1.88 mg/dL — AB (ref 0.44–1.00)
GFR calc Af Amer: 29 mL/min — ABNORMAL LOW (ref >60)
GFR calc non Af Amer: 25 mL/min — ABNORMAL LOW (ref >60)
GLUCOSE: 120 mg/dL — AB (ref 65–99)
Potassium: 5.1 mmol/L (ref 3.5–5.1)
Sodium: 137 mmol/L (ref 135–145)

## 2016-04-28 LAB — GLUCOSE, CAPILLARY
GLUCOSE-CAPILLARY: 109 mg/dL — AB (ref 65–99)
Glucose-Capillary: 121 mg/dL — ABNORMAL HIGH (ref 65–99)
Glucose-Capillary: 100 mg/dL — ABNORMAL HIGH (ref 65–99)

## 2016-04-28 LAB — MAGNESIUM: Magnesium: 2 mg/dL (ref 1.7–2.4)

## 2016-04-28 LAB — PREPARE RBC (CROSSMATCH)

## 2016-04-28 LAB — TSH: TSH: 3.109 u[IU]/mL (ref 0.350–4.500)

## 2016-04-28 MED ORDER — SODIUM CHLORIDE 0.9 % IV SOLN
10.0000 mL/h | Freq: Once | INTRAVENOUS | Status: AC
  Administered 2016-04-28: 10 mL/h via INTRAVENOUS

## 2016-04-28 MED ORDER — PANTOPRAZOLE SODIUM 40 MG IV SOLR: 40.0000 mg | Freq: Two times a day (BID) | INTRAVENOUS | Status: DC

## 2016-04-28 MED ORDER — HALOPERIDOL LACTATE 5 MG/ML IJ SOLN
5.0000 mg | Freq: Once | INTRAMUSCULAR | Status: AC
  Administered 2016-04-28: 5 mg via INTRAMUSCULAR

## 2016-04-28 MED ORDER — HALOPERIDOL LACTATE 5 MG/ML IJ SOLN
INTRAMUSCULAR | Status: AC
  Administered 2016-04-28: 5 mg via INTRAMUSCULAR
  Filled 2016-04-28: qty 1

## 2016-04-28 MED ORDER — SODIUM CHLORIDE 0.9 % IV BOLUS (SEPSIS)
1000.0000 mL | Freq: Once | INTRAVENOUS | Status: AC
  Administered 2016-04-28: 1000 mL via INTRAVENOUS

## 2016-04-28 MED ORDER — SODIUM CHLORIDE 0.9 % IV SOLN
80.0000 mg | Freq: Once | INTRAVENOUS | Status: AC
  Administered 2016-04-29: 80 mg via INTRAVENOUS
  Filled 2016-04-28: qty 80

## 2016-04-28 MED ORDER — SODIUM CHLORIDE 0.9 % IV SOLN
8.0000 mg/h | INTRAVENOUS | Status: DC
  Administered 2016-04-29 – 2016-04-30 (×3): 8 mg/h via INTRAVENOUS
  Filled 2016-04-28 (×5): qty 80

## 2016-04-28 NOTE — ED Provider Notes (Signed)
[facility] Emergency Department Provider Note  ____________________________________________  Time seen: Approximately 9:35 PM  I have reviewed the triage vital signs and the nursing notes.   HISTORY  Chief Complaint Abnormal Lab   HPI Leslie Weber is a 76 y.o. female a history of dementia, CAD, diabetes, hypertension, hypothyroidism who presents from her rehabilitation facility for altered mental status and multiple abnormal labs. According to the facility patient has been more confused than her baseline which prompted them to do blood work and urine. According to the facility patient was found to have an elevated creatinine of 1.7, anemia with hemoglobin of 5.8, K of 5.4, BUN of 72 and questionable UTI. She was transferred here for further evaluation. Patient is very confused at this time which per family is worse than her baseline. According to her daughter she has had no fever, no cough, no diarrhea, no vomiting.  Past Medical History:  Diagnosis Date  . Arthritis   . Coronary artery disease    a. 11/2014 CT chest w/ coronary atherosclerosis.  . Depression   . Diabetes mellitus without complication (HCC)   . Fever due to malaria    76 years old  . Fibromyalgia   . GERD (gastroesophageal reflux disease)   . History of hiatal hernia   . Hypertension   . Hypothyroidism   . Neuromuscular disorder (HCC)    Bells palsy left side of face  . Neuropathy of both feet   . Osteoarthritis    a. s/p R TKA 12/2015.  Marland Kitchen Pneumonia 1997   history of walking pneumonia  . Sleep apnea    not yet had sleep study    Patient Active Problem List   Diagnosis Date Noted  . Delirium due to sepsis 04/18/2016  . Major neurocognitive disorder, due to brain atrophy and cerebrovascular disease 04/18/2016  . Sepsis (HCC) 04/15/2016  . UTI (urinary tract infection) 04/15/2016  . Patellar fracture 03/19/2016  . Wound dehiscence 02/17/2016  . S/P total knee arthroplasty  01/12/2016  . Pulmonary arterial hypertension 11/04/2015  . 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 Obesity 05/03/2014  . Peripheral neuropathy (HCC) 05/03/2014  . Spinal stenosis 05/03/2014  . Hypothyroidism 05/03/2014  . Hypertension 05/03/2014  . HLD (hyperlipidemia) 05/03/2014  . Coronary artery disease 05/03/2014  . DDD (degenerative disc disease), lumbar 01/08/2014    Past Surgical History:  Procedure Laterality Date  . ABDOMINAL HYSTERECTOMY    . APPENDECTOMY    . BACK SURGERY    . BLADDER SUSPENSION  2002  . CHOLECYSTECTOMY    . KNEE ARTHROPLASTY Right 01/12/2016   Procedure: COMPUTER ASSISTED TOTAL KNEE ARTHROPLASTY;  Surgeon: Donato Heinz, MD;  Location: [facility] ORS;  Service: Orthopedics;  Laterality: Right;  . ORIF ANKLE FRACTURE Left 03/19/2016   Procedure: OPEN REDUCTION INTERNAL FIXATION (ORIF) ANKLE FRACTURE;  Surgeon: Donato Heinz, MD;  Location: [facility] ORS;  Service: Orthopedics;  Laterality: Left;  . ORIF PATELLA Right 03/19/2016   Procedure: OPEN REDUCTION INTERNAL (ORIF) FIXATION PATELLA;  Surgeon: Donato Heinz, MD;  Location: [facility] ORS;  Service: Orthopedics;  Laterality: Right;  . TONSILLECTOMY      Prior to Admission medications   Medication Sig Start Date End Date Taking? Authorizing Provider  acetaminophen (TYLENOL) 500 MG tablet Take 500 mg by mouth every 6 (six) hours.   Yes Historical Provider, MD  atorvastatin (LIPITOR) 40 MG tablet Take 40 mg by mouth every morning.   Yes Historical Provider, MD  Calcium Carb-Cholecalciferol (CALCIUM 600+D) 600-800 MG-UNIT TABS Take 1 Dose by mouth 2 (two) times daily.    Yes Historical Provider, MD  celecoxib (CELEBREX) 200 MG capsule Take 200 mg by mouth 2 (two) times daily.   Yes Historical Provider, MD  citalopram (CELEXA) 20 MG tablet Take 20 mg by mouth every morning.   Yes Historical Provider, MD  ferrous sulfate 325 (65 FE) MG tablet Take 325 mg by mouth daily with breakfast.   Yes Historical  Provider, MD  gabapentin (NEURONTIN) 600 MG tablet Take 0.5 tablets (300 mg total) by mouth 2 (two) times daily. 04/22/16  Yes Srikar Sudini, MD  haloperidol (HALDOL) 0.5 MG tablet Take 1 tablet (0.5 mg total) by mouth at bedtime. 04/21/16  Yes Srikar Sudini, MD  levothyroxine (SYNTHROID, LEVOTHROID) 50 MCG tablet Take 50 mcg by mouth daily before breakfast.   Yes Historical Provider, MD  liraglutide (VICTOZA) 18 MG/3ML SOPN Inject 0.6 mg into the skin every morning.   Yes Historical Provider, MD  LORazepam (ATIVAN) 0.5 MG tablet Take 1 tablet (0.5 mg total) by mouth at bedtime as needed for anxiety. 04/21/16  Yes Srikar Sudini, MD  metoprolol tartrate (LOPRESSOR) 25 MG tablet Take 1 tablet (25 mg total) by mouth 2 (two) times daily. 04/20/16  Yes Srikar Sudini, MD  Multiple Vitamins-Minerals (CENTRUM WOMEN PO) Take 1 tablet by mouth every morning.   Yes Historical Provider, MD  nortriptyline (PAMELOR) 75 MG capsule Take 75 mg by mouth at bedtime.   Yes Historical Provider, MD  oseltamivir (TAMIFLU) 75 MG capsule Take 75 mg by mouth 2 (two) times daily.   Yes Historical Provider, MD  pantoprazole (PROTONIX) 40 MG tablet Take 40 mg by mouth daily before breakfast.    Yes Historical Provider, MD  sulfamethoxazole-trimethoprim (BACTRIM DS,SEPTRA DS) 800-160 MG tablet Take 1 tablet by mouth 2 (two) times daily. 04/20/16  Yes Srikar Sudini, MD  traMADol (ULTRAM) 50 MG tablet Take 1 tablet (50 mg total) by mouth every 6 (six) hours as needed for moderate pain. 04/20/16  Yes Milagros Loll, MD    Allergies Oxycodone  Family History  Problem Relation Age of Onset  . Liver cancer Mother   . Lung cancer Father     Social History Social History  Substance Use Topics  . Smoking status: Never Smoker  . Smokeless tobacco: Never Used  . Alcohol use No    Review of Systems  Constitutional: Negative for fever. + confusion Eyes: Negative for visual changes. ENT: Negative for sore throat. Neck: No neck  pain  Cardiovascular: Negative for chest pain. Respiratory: Negative for shortness of breath. Gastrointestinal: Negative for abdominal pain, vomiting or diarrhea. Genitourinary: Negative for dysuria. Musculoskeletal: Negative for back pain. Skin: Negative for rash. Neurological: Negative for headaches, weakness or numbness. Psych: No SI or HI  ____________________________________________   PHYSICAL EXAM:  VITAL SIGNS: ED Triage Vitals  Enc Vitals Group     BP 04/28/16 2014 (!) 108/45     Pulse Rate 04/28/16 2014 94     Resp 04/28/16 2014 13     Temp 04/28/16 2014 98.2 F (36.8 C)     Temp Source 04/28/16 2014 Oral     SpO2 04/28/16 2009 99 %     Weight 04/28/16 2015 215 lb (97.5 kg)     Height 04/28/16 2015 5\' 7"  (1.702 m)     Head Circumference --      Peak Flow --      Pain Score --  Pain Loc --      Pain Edu? --      Excl. in GC? --     Constitutional: Alert, confused, combative, no distress, looks pale and dry HEENT:      Head: Normocephalic and atraumatic.         Eyes: Conjunctivae are normal. Sclera is non-icteric. EOMI. PERRL      Mouth/Throat: Mucous membranes are dry.       Neck: Supple with no signs of meningismus. Cardiovascular: Regular rate and rhythm. No murmurs, gallops, or rubs. 2+ symmetrical distal pulses are present in all extremities. No JVD. Respiratory: Normal respiratory effort. Lungs are clear to auscultation bilaterally. No wheezes, crackles, or rhonchi.  Gastrointestinal: Soft, non tender, and non distended with positive bowel sounds. No rebound or guarding. Rectal exam showing black stool grossly hemoccult positive Musculoskeletal: Nontender with normal range of motion in all extremities. No edema, cyanosis, or erythema of extremities. Neurologic: Normal speech and language. Face is symmetric. Moving all extremities. No gross focal neurologic deficits are appreciated. Skin: Skin is warm, dry and intact. No rash noted. Psychiatric: Mood  and affect are normal. Speech and behavior are normal.  ____________________________________________   LABS (all labs ordered are listed, but only abnormal results are displayed)  Labs Reviewed  CBC WITH DIFFERENTIAL/PLATELET - Abnormal; Notable for the following:       Result Value   WBC 16.0 (*)    RBC 1.96 (*)    Hemoglobin 5.7 (*)    HCT 17.3 (*)    RDW 16.1 (*)    Neutro Abs 12.3 (*)    Monocytes Absolute 1.4 (*)    All other components within normal limits  BASIC METABOLIC PANEL - Abnormal; Notable for the following:    Glucose, Bld 120 (*)    BUN 77 (*)    Creatinine, Ser 1.88 (*)    GFR calc non Af Amer 25 (*)    GFR calc Af Amer 29 (*)    All other components within normal limits  URINALYSIS, COMPLETE (UACMP) WITH MICROSCOPIC - Abnormal; Notable for the following:    Color, Urine YELLOW (*)    APPearance CLEAR (*)    Leukocytes, UA TRACE (*)    Squamous Epithelial / LPF 0-5 (*)    All other components within normal limits  URINE CULTURE  URINE DRUG SCREEN, QUALITATIVE ([facility] ONLY)  TYPE AND SCREEN  PREPARE RBC (CROSSMATCH)  TYPE AND SCREEN   ____________________________________________  EKG  none ____________________________________________  RADIOLOGY  none  ____________________________________________   PROCEDURES  Procedure(s) performed: None Procedures Critical Care performed:  None ____________________________________________   INITIAL IMPRESSION / ASSESSMENT AND PLAN / ED COURSE  76 y.o. female a history of dementia, CAD, diabetes, hypertension, hypothyroidism who presents from her rehabilitation facility for altered mental status and multiple abnormal labs. Patient is confused and combative. She was given 5 mg of IM hold all to allow for IV placement, blood draw, and urinalysis. Her daughter who is her healthcare power of attorney has consented to sedation. Will check CBC, BMP, urinalysis, chest x-ray. We'll watch patient on telemetry. Her  vitals are stable.   ED COURSE: Repeat hemoglobin here confirmed at 5.7 (baseline is between 8.5 and 9.7). Patient's rectal exam showing grossly positive black tarry stools. Patient's daughter who is at the bedside and is her healthcare power of attorney consented for blood transfusion. We'll give PPI bolus and drip. Patient also find have a KI with creatinine of 1.88 (baseline is 0.8).  Was started on IV fluids. Patient will be admitted to the hospitalist service.   Pertinent labs & imaging results that were available during my care of the patient were reviewed by me and considered in my medical decision making (see chart for details).    ____________________________________________   FINAL CLINICAL IMPRESSION(S) / ED DIAGNOSES  Final diagnoses:  UGIB (upper gastrointestinal bleed)  AKI (acute kidney injury) (HCC)  Delirium      NEW MEDICATIONS STARTED DURING THIS VISIT:  New Prescriptions   No medications on file     Note:  This document was prepared using Dragon voice recognition software and may include unintentional dictation errors.    Nita Sicklearolina Desyre Calma, MD 04/28/16 2248

## 2016-04-28 NOTE — ED Triage Notes (Signed)
[facility] arrives to [facility] from [facility] with c/o abnormal lab work. EMS reports pt is in rehab for right leg fracture and was called out for abnormal lab work. EMS states hemoglobin was low; paperwork brought with pt reports Hgb 5.8; K+ 5.4; BUN 72; Creat 1.7; WBC 11.6. Pt appears pale, confused d/t dementia at baseline.

## 2016-04-28 NOTE — ED Notes (Signed)
Pt given apple juice to drink, per Dr Don PerkingVeronese.

## 2016-04-28 NOTE — H&P (Signed)
[facility] [place] [facility]   PATIENT NAME: Leslie Weber    MR#:  [id number]  DATE OF BIRTH:  [birth year]  DATE OF ADMISSION:  04/28/2016  PRIMARY CARE PHYSICIAN: Rafael Bihari, MD   REQUESTING/REFERRING PHYSICIAN: Don Perking, MD  CHIEF COMPLAINT:   Chief Complaint  Patient presents with  . Abnormal Lab    HISTORY OF PRESENT ILLNESS:  Leslie Weber  is a 76 y.o. female who presents with Several days of worsening mental status. She was at her nursing facility after recent hospitalization. They drew labs which resulted today and showed her hemoglobin was down to 6. It was 9 when she was discharged from this hospital about a week ago. Here in the ED she was heme positive on stool test. Hospitalists were called for admission with strong suspicion for GI bleed.  PAST MEDICAL HISTORY:   Past Medical History:  Diagnosis Date  . Arthritis   . Coronary artery disease    a. 11/2014 CT chest w/ coronary atherosclerosis.  . Depression   . Diabetes mellitus without complication (HCC)   . Fever due to malaria    76 years old  . Fibromyalgia   . GERD (gastroesophageal reflux disease)   . History of hiatal hernia   . Hypertension   . Hypothyroidism   . Neuromuscular disorder (HCC)    Bells palsy left side of face  . Neuropathy of both feet   . Osteoarthritis    a. s/p R TKA 12/2015.  Marland Kitchen Pneumonia 1997   history of walking pneumonia  . Sleep apnea    not yet had sleep study    PAST SURGICAL HISTORY:   Past Surgical History:  Procedure Laterality Date  . ABDOMINAL HYSTERECTOMY    . APPENDECTOMY    . BACK SURGERY    . BLADDER SUSPENSION  2002  . CHOLECYSTECTOMY    . KNEE ARTHROPLASTY Right 01/12/2016   Procedure: COMPUTER ASSISTED TOTAL KNEE ARTHROPLASTY;  Surgeon: Donato Heinz, MD;  Location: [facility] ORS;  Service: Orthopedics;  Laterality: Right;  . ORIF ANKLE FRACTURE Left 03/19/2016   Procedure: OPEN REDUCTION INTERNAL FIXATION (ORIF) ANKLE  FRACTURE;  Surgeon: Donato Heinz, MD;  Location: [facility] ORS;  Service: Orthopedics;  Laterality: Left;  . ORIF PATELLA Right 03/19/2016   Procedure: OPEN REDUCTION INTERNAL (ORIF) FIXATION PATELLA;  Surgeon: Donato Heinz, MD;  Location: [facility] ORS;  Service: Orthopedics;  Laterality: Right;  . TONSILLECTOMY      SOCIAL HISTORY:   Social History  Substance Use Topics  . Smoking status: Never Smoker  . Smokeless tobacco: Never Used  . Alcohol use No    FAMILY HISTORY:   Family History  Problem Relation Age of Onset  . Liver cancer Mother   . Lung cancer Father     DRUG ALLERGIES:   Allergies  Allergen Reactions  . Oxycodone Other (See Comments)    "stopped breathing"    MEDICATIONS AT HOME:   Prior to Admission medications   Medication Sig Start Date End Date Taking? Authorizing Provider  acetaminophen (TYLENOL) 500 MG tablet Take 500 mg by mouth every 6 (six) hours.   Yes Historical Provider, MD  atorvastatin (LIPITOR) 40 MG tablet Take 40 mg by mouth every morning.   Yes Historical Provider, MD  Calcium Carb-Cholecalciferol (CALCIUM 600+D) 600-800 MG-UNIT TABS Take 1 Dose by mouth 2 (two) times daily.    Yes Historical Provider, MD  celecoxib (CELEBREX) 200 MG capsule Take 200 mg  by mouth 2 (two) times daily.   Yes Historical Provider, MD  citalopram (CELEXA) 20 MG tablet Take 20 mg by mouth every morning.   Yes Historical Provider, MD  ferrous sulfate 325 (65 FE) MG tablet Take 325 mg by mouth daily with breakfast.   Yes Historical Provider, MD  gabapentin (NEURONTIN) 600 MG tablet Take 0.5 tablets (300 mg total) by mouth 2 (two) times daily. 04/22/16  Yes Srikar Sudini, MD  haloperidol (HALDOL) 0.5 MG tablet Take 1 tablet (0.5 mg total) by mouth at bedtime. 04/21/16  Yes Srikar Sudini, MD  levothyroxine (SYNTHROID, LEVOTHROID) 50 MCG tablet Take 50 mcg by mouth daily before breakfast.   Yes Historical Provider, MD  liraglutide (VICTOZA) 18 MG/3ML SOPN Inject 0.6 mg into the  skin every morning.   Yes Historical Provider, MD  LORazepam (ATIVAN) 0.5 MG tablet Take 1 tablet (0.5 mg total) by mouth at bedtime as needed for anxiety. 04/21/16  Yes Srikar Sudini, MD  metoprolol tartrate (LOPRESSOR) 25 MG tablet Take 1 tablet (25 mg total) by mouth 2 (two) times daily. 04/20/16  Yes Srikar Sudini, MD  Multiple Vitamins-Minerals (CENTRUM WOMEN PO) Take 1 tablet by mouth every morning.   Yes Historical Provider, MD  nortriptyline (PAMELOR) 75 MG capsule Take 75 mg by mouth at bedtime.   Yes Historical Provider, MD  oseltamivir (TAMIFLU) 75 MG capsule Take 75 mg by mouth 2 (two) times daily.   Yes Historical Provider, MD  pantoprazole (PROTONIX) 40 MG tablet Take 40 mg by mouth daily before breakfast.    Yes Historical Provider, MD  sulfamethoxazole-trimethoprim (BACTRIM DS,SEPTRA DS) 800-160 MG tablet Take 1 tablet by mouth 2 (two) times daily. 04/20/16  Yes Srikar Sudini, MD  traMADol (ULTRAM) 50 MG tablet Take 1 tablet (50 mg total) by mouth every 6 (six) hours as needed for moderate pain. 04/20/16  Yes Srikar Sudini, MD    REVIEW OF SYSTEMS:  Review of Systems  Unable to perform ROS: Acuity of condition     VITAL SIGNS:   Vitals:   04/28/16 2130 04/28/16 2230 04/28/16 2233 04/28/16 2322  BP: (!) 84/54 (!) 104/56 (!) 104/56 (!) 104/56  Pulse: 83 87 88 84  Resp: (!) 21 15 18 18   Temp:    98.4 F (36.9 C)  TempSrc:    Oral  SpO2: (!) 81% 100% 94% 96%  Weight:      Height:       Wt Readings from Last 3 Encounters:  04/28/16 97.5 kg (215 lb)  04/16/16 97.8 kg (215 lb 9 oz)  03/19/16 98.9 kg (218 lb)    PHYSICAL EXAMINATION:  Physical Exam  Vitals reviewed. Constitutional: She appears well-developed and well-nourished. No distress.  HENT:  Head: Normocephalic and atraumatic.  Dry mucous membranes  Eyes: Conjunctivae and EOM are normal. Pupils are equal, round, and reactive to light. No scleral icterus.  Neck: Normal range of motion. Neck supple. No JVD  present. No thyromegaly present.  Cardiovascular: Normal rate, regular rhythm and intact distal pulses.  Exam reveals no gallop and no friction rub.   No murmur heard. Respiratory: Effort normal and breath sounds normal. No respiratory distress. She has no wheezes. She has no rales.  GI: Soft. Bowel sounds are normal. She exhibits no distension. There is no tenderness.  Musculoskeletal: Normal range of motion. She exhibits no edema.  No arthritis, no gout  Lymphadenopathy:    She has no cervical adenopathy.  Neurological: She is alert. No cranial nerve deficit.  Unable to fully assess due to patient condition  Skin: Skin is warm and dry. No rash noted. No erythema.  Psychiatric:  Unable to assess due to patient condition    LABORATORY PANEL:   CBC  Recent Labs Lab 04/28/16 2140  WBC 16.0*  HGB 5.7*  HCT 17.3*  PLT 348   ------------------------------------------------------------------------------------------------------------------  Chemistries   Recent Labs Lab 04/28/16 1615 04/28/16 2140  NA 137 137  K 5.4* 5.1  CL 102 104  CO2 30 27  GLUCOSE 103* 120*  BUN 72* 77*  CREATININE 1.70* 1.88*  CALCIUM 10.2 10.0  MG 2.0  --   AST 16  --   ALT 12*  --   ALKPHOS 50  --   BILITOT 0.3  --    ------------------------------------------------------------------------------------------------------------------  Cardiac Enzymes No results for input(s): TROPONINI in the last 168 hours. ------------------------------------------------------------------------------------------------------------------  RADIOLOGY:  Dg Chest Portable 1 View  Result Date: 04/28/2016 CLINICAL DATA:  Altered mental status and weakness EXAM: PORTABLE CHEST 1 VIEW COMPARISON:  Chest radiograph 04/10/2016 FINDINGS: The right hemidiaphragm remains elevated. There is bibasilar atelectasis. No focal airspace consolidation. No pneumothorax or sizable pleural effusion. Large hiatal hernia. IMPRESSION:  Shallow lung inflation with persistent elevation of the right hemidiaphragm. No focal consolidation. Electronically Signed   By: Deatra RobinsonKevin  Herman M.D.   On: 04/28/2016 21:41    EKG:   Orders placed or performed during the hospital encounter of 04/28/16  . EKG 12-Lead  . EKG 12-Lead  . ED EKG  . ED EKG    IMPRESSION AND PLAN:  Principal Problem:   GI bleed - blood transfusion ordered in the ED, follow serial hemoglobin, IV fluids, IV Protonix, GI consult, patient is hemodynamically stable Active Problems:   Hypertension - blood pressure is borderline low, but stable. We will hold home antihypertensives for now.   Coronary artery disease - continue home meds   Type 2 diabetes with nephropathy (HCC) - sliding scale insulin with corresponding glucose checks   Major neurocognitive disorder, due to brain atrophy and cerebrovascular disease - continue home meds   Hypothyroidism - home dose thyroid replacement  All the records are reviewed and case discussed with ED provider. Management plans discussed with the patient and/or family.  DVT PROPHYLAXIS: Mechanical only  GI PROPHYLAXIS: PPI  ADMISSION STATUS: Inpatient  CODE STATUS: Full Code Status History    Date Active Date Inactive Code Status Order ID Comments User Context   04/16/2016  2:02 AM 04/21/2016 11:57 PM Full Code [id number]  Oralia Manisavid Maude Hettich, MD Inpatient   03/19/2016  3:10 PM 03/22/2016  5:15 PM Full Code [id number]  Donato HeinzJames P Hooten, MD Inpatient   01/15/2016  8:06 PM 01/16/2016  6:23 PM Full Code [id number]  Enid Baasadhika Kalisetti, MD ED   01/12/2016  3:43 PM 01/14/2016 10:03 PM Full Code [id number]  Donato HeinzJames P Hooten, MD Inpatient    Advance Directive Documentation   Flowsheet Row Most Recent Value  Type of Advance Directive  Out of facility DNR (pink MOST or yellow form)  Pre-existing out of facility DNR order (yellow form or pink MOST form)  Pink MOST form placed in chart (order not valid for inpatient use)  "MOST" Form in Place?  No  data      TOTAL TIME TAKING CARE OF THIS PATIENT: 45 minutes.    Leslie Weber FIELDING 04/28/2016, 11:37 PM  [facility]  Office  [phone]  CC: Primary care physician; Rafael BihariWALKER III, JOHN B, MD

## 2016-04-29 DIAGNOSIS — K921 Melena: Principal | ICD-10-CM

## 2016-04-29 LAB — VITAMIN B12: VITAMIN B 12: 279 pg/mL (ref 180–914)

## 2016-04-29 LAB — VITAMIN D 25 HYDROXY (VIT D DEFICIENCY, FRACTURES): VIT D 25 HYDROXY: 31.1 ng/mL (ref 30.0–100.0)

## 2016-04-29 LAB — BASIC METABOLIC PANEL
Anion gap: 3 — ABNORMAL LOW (ref 5–15)
BUN: 66 mg/dL — AB (ref 6–20)
CHLORIDE: 107 mmol/L (ref 101–111)
CO2: 27 mmol/L (ref 22–32)
CREATININE: 1.66 mg/dL — AB (ref 0.44–1.00)
Calcium: 9.4 mg/dL (ref 8.9–10.3)
GFR calc Af Amer: 34 mL/min — ABNORMAL LOW (ref >60)
GFR calc non Af Amer: 30 mL/min — ABNORMAL LOW (ref >60)
GLUCOSE: 106 mg/dL — AB (ref 65–99)
POTASSIUM: 4.8 mmol/L (ref 3.5–5.1)
Sodium: 137 mmol/L (ref 135–145)

## 2016-04-29 LAB — GLUCOSE, CAPILLARY
GLUCOSE-CAPILLARY: 77 mg/dL (ref 65–99)
Glucose-Capillary: 112 mg/dL — ABNORMAL HIGH (ref 65–99)
Glucose-Capillary: 124 mg/dL — ABNORMAL HIGH (ref 65–99)
Glucose-Capillary: 94 mg/dL (ref 65–99)

## 2016-04-29 LAB — CBC
HEMATOCRIT: 23 % — AB (ref 35.0–47.0)
Hemoglobin: 7.6 g/dL — ABNORMAL LOW (ref 12.0–16.0)
MCH: 29.3 pg (ref 26.0–34.0)
MCHC: 33.1 g/dL (ref 32.0–36.0)
MCV: 88.5 fL (ref 80.0–100.0)
PLATELETS: 303 10*3/uL (ref 150–440)
RBC: 2.59 MIL/uL — ABNORMAL LOW (ref 3.80–5.20)
RDW: 14.6 % — AB (ref 11.5–14.5)
WBC: 13.5 10*3/uL — AB (ref 3.6–11.0)

## 2016-04-29 LAB — HEMOGLOBIN: HEMOGLOBIN: 8.5 g/dL — AB (ref 12.0–16.0)

## 2016-04-29 LAB — PREPARE RBC (CROSSMATCH)

## 2016-04-29 LAB — MRSA PCR SCREENING: MRSA by PCR: NEGATIVE

## 2016-04-29 MED ORDER — OSELTAMIVIR PHOSPHATE 75 MG PO CAPS
75.0000 mg | ORAL_CAPSULE | Freq: Two times a day (BID) | ORAL | Status: AC
  Administered 2016-04-29: 02:00:00 75 mg via ORAL
  Filled 2016-04-29: qty 1

## 2016-04-29 MED ORDER — INSULIN ASPART 100 UNIT/ML ~~LOC~~ SOLN
0.0000 [IU] | Freq: Four times a day (QID) | SUBCUTANEOUS | Status: DC
  Administered 2016-04-29: 1 [IU] via SUBCUTANEOUS
  Filled 2016-04-29: qty 1

## 2016-04-29 MED ORDER — LEVOTHYROXINE SODIUM 50 MCG PO TABS
50.0000 ug | ORAL_TABLET | Freq: Every day | ORAL | Status: DC
  Administered 2016-04-29 – 2016-05-01 (×3): 50 ug via ORAL
  Filled 2016-04-29 (×3): qty 1

## 2016-04-29 MED ORDER — SODIUM CHLORIDE 0.9 % IV SOLN
Freq: Once | INTRAVENOUS | Status: AC
  Administered 2016-04-29: 14:00:00 via INTRAVENOUS

## 2016-04-29 MED ORDER — SODIUM CHLORIDE 0.9 % IV SOLN
INTRAVENOUS | Status: AC
  Administered 2016-04-29: 06:00:00 via INTRAVENOUS

## 2016-04-29 MED ORDER — ONDANSETRON HCL 4 MG/2ML IJ SOLN: 4.0000 mg | Freq: Four times a day (QID) | INTRAMUSCULAR | Status: DC | PRN

## 2016-04-29 MED ORDER — ATORVASTATIN CALCIUM 20 MG PO TABS
40.0000 mg | ORAL_TABLET | Freq: Every day | ORAL | Status: DC
  Administered 2016-04-29 – 2016-04-30 (×2): 40 mg via ORAL
  Filled 2016-04-29 (×2): qty 2

## 2016-04-29 MED ORDER — ACETAMINOPHEN 325 MG PO TABS
650.0000 mg | ORAL_TABLET | Freq: Four times a day (QID) | ORAL | Status: DC | PRN
  Administered 2016-04-29 (×2): 650 mg via ORAL
  Filled 2016-04-29 (×2): qty 2

## 2016-04-29 MED ORDER — ONDANSETRON HCL 4 MG PO TABS: 4.0000 mg | ORAL_TABLET | Freq: Four times a day (QID) | ORAL | Status: DC | PRN

## 2016-04-29 MED ORDER — NORTRIPTYLINE HCL 25 MG PO CAPS
75.0000 mg | ORAL_CAPSULE | Freq: Every day | ORAL | Status: DC
  Administered 2016-04-29 – 2016-04-30 (×3): 75 mg via ORAL
  Filled 2016-04-29 (×3): qty 3

## 2016-04-29 MED ORDER — LORAZEPAM 0.5 MG PO TABS
0.5000 mg | ORAL_TABLET | Freq: Every evening | ORAL | Status: DC | PRN
  Administered 2016-04-29: 0.5 mg via ORAL
  Filled 2016-04-29: qty 1

## 2016-04-29 MED ORDER — HALOPERIDOL 1 MG PO TABS
0.5000 mg | ORAL_TABLET | Freq: Every day | ORAL | Status: DC
  Administered 2016-04-29 – 2016-04-30 (×2): 0.5 mg via ORAL
  Filled 2016-04-29 (×2): qty 1

## 2016-04-29 MED ORDER — CITALOPRAM HYDROBROMIDE 20 MG PO TABS
20.0000 mg | ORAL_TABLET | Freq: Every day | ORAL | Status: DC
  Administered 2016-04-29 – 2016-05-01 (×3): 20 mg via ORAL
  Filled 2016-04-29 (×3): qty 1

## 2016-04-29 MED ORDER — ACETAMINOPHEN 650 MG RE SUPP: 650.0000 mg | Freq: Four times a day (QID) | RECTAL | Status: DC | PRN

## 2016-04-29 MED ORDER — GABAPENTIN 600 MG PO TABS
300.0000 mg | ORAL_TABLET | Freq: Two times a day (BID) | ORAL | Status: DC
  Administered 2016-04-29 – 2016-05-01 (×4): 300 mg via ORAL
  Filled 2016-04-29 (×4): qty 1

## 2016-04-29 NOTE — Consult Note (Signed)
Midge Minium, MD [facility]  [address] [place], [place] [address] Phone: [phone] Fax : [phone]  Consultation  Referring Provider:     Dr. Hilton Sinclair Primary Care Physician:  Rafael Bihari, MD Primary Gastroenterologist:  None         Reason for Consultation:     Melena  Date of Admission:  04/28/2016 Date of Consultation:  04/29/2016         HPI:   Leslie Weber is a 76 y.o. female who is admitted to the hospital with worsening mental status.  The patient was at a nursing facility after having recent surgery on her Lower extremity.  The patient was found to have an elevated creatinine with anemia showing a hemoglobin of 5.8.  The patient does not give much of a history and most the history is gotten through the daughter. The patient had a recent admission at the end of January for a change in mental status also.  This was thought to be a result of sepsis. The patient was noted to have a dark bowel movement this morning. The patient's hemoglobin after 2 units of blood went up to 7.6 and was 8.5 this evening.  Past Medical History:  Diagnosis Date  . Arthritis   . Coronary artery disease    a. 11/2014 CT chest w/ coronary atherosclerosis.  . Depression   . Diabetes mellitus without complication (HCC)   . Fever due to malaria    76 years old  . Fibromyalgia   . GERD (gastroesophageal reflux disease)   . History of hiatal hernia   . Hypertension   . Hypothyroidism   . Neuromuscular disorder (HCC)    Bells palsy left side of face  . Neuropathy of both feet   . Osteoarthritis    a. s/p R TKA 12/2015.  Marland Kitchen Pneumonia 1997   history of walking pneumonia  . Sleep apnea    not yet had sleep study    Past Surgical History:  Procedure Laterality Date  . ABDOMINAL HYSTERECTOMY    . APPENDECTOMY    . BACK SURGERY    . BLADDER SUSPENSION  2002  . CHOLECYSTECTOMY    . KNEE ARTHROPLASTY Right 01/12/2016   Procedure: COMPUTER ASSISTED TOTAL KNEE ARTHROPLASTY;  Surgeon:  Donato Heinz, MD;  Location: [facility] ORS;  Service: Orthopedics;  Laterality: Right;  . ORIF ANKLE FRACTURE Left 03/19/2016   Procedure: OPEN REDUCTION INTERNAL FIXATION (ORIF) ANKLE FRACTURE;  Surgeon: Donato Heinz, MD;  Location: [facility] ORS;  Service: Orthopedics;  Laterality: Left;  . ORIF PATELLA Right 03/19/2016   Procedure: OPEN REDUCTION INTERNAL (ORIF) FIXATION PATELLA;  Surgeon: Donato Heinz, MD;  Location: [facility] ORS;  Service: Orthopedics;  Laterality: Right;  . TONSILLECTOMY      Prior to Admission medications   Medication Sig Start Date End Date Taking? Authorizing Provider  acetaminophen (TYLENOL) 500 MG tablet Take 500 mg by mouth every 6 (six) hours.   Yes Historical Provider, MD  atorvastatin (LIPITOR) 40 MG tablet Take 40 mg by mouth every morning.   Yes Historical Provider, MD  Calcium Carb-Cholecalciferol (CALCIUM 600+D) 600-800 MG-UNIT TABS Take 1 Dose by mouth 2 (two) times daily.    Yes Historical Provider, MD  celecoxib (CELEBREX) 200 MG capsule Take 200 mg by mouth 2 (two) times daily.   Yes Historical Provider, MD  citalopram (CELEXA) 20 MG tablet Take 20 mg by mouth every morning.   Yes Historical Provider, MD  ferrous sulfate  325 (65 FE) MG tablet Take 325 mg by mouth daily with breakfast.   Yes Historical Provider, MD  gabapentin (NEURONTIN) 600 MG tablet Take 0.5 tablets (300 mg total) by mouth 2 (two) times daily. 04/22/16  Yes Srikar Sudini, MD  haloperidol (HALDOL) 0.5 MG tablet Take 1 tablet (0.5 mg total) by mouth at bedtime. 04/21/16  Yes Srikar Sudini, MD  levothyroxine (SYNTHROID, LEVOTHROID) 50 MCG tablet Take 50 mcg by mouth daily before breakfast.   Yes Historical Provider, MD  liraglutide (VICTOZA) 18 MG/3ML SOPN Inject 0.6 mg into the skin every morning.   Yes Historical Provider, MD  LORazepam (ATIVAN) 0.5 MG tablet Take 1 tablet (0.5 mg total) by mouth at bedtime as needed for anxiety. 04/21/16  Yes Srikar Sudini, MD  metoprolol tartrate (LOPRESSOR) 25 MG  tablet Take 1 tablet (25 mg total) by mouth 2 (two) times daily. 04/20/16  Yes Srikar Sudini, MD  Multiple Vitamins-Minerals (CENTRUM WOMEN PO) Take 1 tablet by mouth every morning.   Yes Historical Provider, MD  nortriptyline (PAMELOR) 75 MG capsule Take 75 mg by mouth at bedtime.   Yes Historical Provider, MD  oseltamivir (TAMIFLU) 75 MG capsule Take 75 mg by mouth 2 (two) times daily.   Yes Historical Provider, MD  pantoprazole (PROTONIX) 40 MG tablet Take 40 mg by mouth daily before breakfast.    Yes Historical Provider, MD  sulfamethoxazole-trimethoprim (BACTRIM DS,SEPTRA DS) 800-160 MG tablet Take 1 tablet by mouth 2 (two) times daily. 04/20/16  Yes Srikar Sudini, MD  traMADol (ULTRAM) 50 MG tablet Take 1 tablet (50 mg total) by mouth every 6 (six) hours as needed for moderate pain. 04/20/16  Yes Milagros LollSrikar Sudini, MD    Family History  Problem Relation Age of Onset  . Liver cancer Mother   . Lung cancer Father      Social History  Substance Use Topics  . Smoking status: Never Smoker  . Smokeless tobacco: Never Used  . Alcohol use No    Allergies as of 04/28/2016 - Review Complete 04/28/2016  Allergen Reaction Noted  . Oxycodone Other (See Comments) 03/18/2016    Review of Systems:    All systems reviewed and negative except where noted in HPI.   Physical Exam:  Vital signs in last 24 hours: Temp:  [98 F (36.7 C)-98.7 F (37.1 C)] 98.6 F (37 C) (02/08 1714) Pulse Rate:  [80-94] 86 (02/08 1714) Resp:  [13-21] 16 (02/08 1714) BP: (84-132)/(41-101) 132/55 (02/08 1714) SpO2:  [81 %-100 %] 100 % (02/08 1714) Weight:  [208 lb 1.6 oz (94.4 kg)-215 lb (97.5 kg)] 208 lb 1.6 oz (94.4 kg) (02/08 0127) Last BM Date: 04/29/16 General:   Pleasant, cooperative in NAD Head:  Normocephalic and atraumatic. Eyes:   No icterus.   Conjunctiva pink. PERRLA. Ears:  Normal auditory acuity. Neck:  Supple; no masses or thyroidomegaly Lungs: Respirations even and unlabored. Lungs clear to  auscultation bilaterally.   No wheezes, crackles, or rhonchi.  Heart:  Regular rate and rhythm;  Without murmur, clicks, rubs or gallops Abdomen:  Soft, nondistended, nontender. Normal bowel sounds. No appreciable masses or hepatomegaly.  No rebound or guarding.  Rectal:  Not performed. Msk:  Symmetrical without gross deformities.    Extremities:  Without edema, cyanosis or clubbing. Patient has braces on her lower extremities Neurologic:  Alert and Confused;  grossly normal neurologically. Skin:  Intact without significant lesions or rashes. Cervical Nodes:  No significant cervical adenopathy. Psych:  Alert and cooperative. Normal affect.  LAB RESULTS:  Recent Labs  04/28/16 1615 04/28/16 2140 04/29/16 0626 04/29/16 1839  WBC 11.6* 16.0* 13.5*  --   HGB 5.8* 5.7* 7.6* 8.5*  HCT 18.3* 17.3* 23.0*  --   PLT 354 348 303  --    BMET  Recent Labs  04/28/16 1615 04/28/16 2140 04/29/16 0626  NA 137 137 137  K 5.4* 5.1 4.8  CL 102 104 107  CO2 30 27 27   GLUCOSE 103* 120* 106*  BUN 72* 77* 66*  CREATININE 1.70* 1.88* 1.66*  CALCIUM 10.2 10.0 9.4   LFT  Recent Labs  04/28/16 1615  PROT 5.2*  ALBUMIN 2.9*  AST 16  ALT 12*  ALKPHOS 50  BILITOT 0.3   PT/INR No results for input(s): LABPROT, INR in the last 72 hours.  STUDIES: Dg Chest Portable 1 View  Result Date: 04/28/2016 CLINICAL DATA:  Altered mental status and weakness EXAM: PORTABLE CHEST 1 VIEW COMPARISON:  Chest radiograph 04/10/2016 FINDINGS: The right hemidiaphragm remains elevated. There is bibasilar atelectasis. No focal airspace consolidation. No pneumothorax or sizable pleural effusion. Large hiatal hernia. IMPRESSION: Shallow lung inflation with persistent elevation of the right hemidiaphragm. No focal consolidation. Electronically Signed   By: Deatra Robinson M.D.   On: 04/28/2016 21:41      Impression / Plan:   Leslie Weber is a 76 y.o. y/o female with anemia and black stools.  The patient came in  with a hemoglobin of 5.8 that is up to 8.5 tonight.  This is after the patient had been transfused. Since the patient has been having black stools and anemia the patient will be set up for a upper endoscopy for tomorrow.  The patient's family and power of attorney have been explained the plan and agree with that.  Thank you for involving me in the care of this patient.      LOS: 1 day   Midge Minium, MD  04/29/2016, 7:57 PM   Note: This dictation was prepared with Dragon dictation along with smaller phrase technology. Any transcriptional errors that result from this process are unintentional.

## 2016-04-29 NOTE — Plan of Care (Signed)
Problem: Bowel/Gastric: Goal: Will show no signs and symptoms of gastrointestinal bleeding Outcome: Progressing Pt continues to have black stools

## 2016-04-29 NOTE — Progress Notes (Signed)
Patient ID: Leslie Weber, female   DOB: 1941/01/24, 76 y.o.   MRN: [id number]  [facility] PROGRESS NOTE  Leslie Weber XBJ:[id number] DOB: October 31, 1940 DOA: 04/28/2016 PCP: Rafael Bihari, MD  HPI/Subjective: Patient was very sleepy this morning. She answered some questions but fell back asleep easily. Patient still looked very pale. As per the nursing staff the patient did have a dark bowel movement at 10 AM  Objective: Vitals:   04/29/16 1432 04/29/16 1449  BP: (!) 110/41 (!) 121/48  Pulse: 80 84  Resp: 18 18  Temp: 98.5 F (36.9 C) 98.4 F (36.9 C)    Filed Weights   04/28/16 2015 04/29/16 0127  Weight: 97.5 kg (215 lb) 94.4 kg (208 lb 1.6 oz)    ROS: Review of Systems  Constitutional: Negative for chills and fever.  Eyes: Negative for blurred vision.  Respiratory: Negative for cough and shortness of breath.   Cardiovascular: Negative for chest pain.  Gastrointestinal: Positive for melena. Negative for abdominal pain, constipation, diarrhea, nausea and vomiting.  Genitourinary: Negative for dysuria.  Musculoskeletal: Negative for joint pain.  Neurological: Negative for dizziness and headaches.   Exam: Physical Exam  Constitutional: She is oriented to person, place, and time.  HENT:  Nose: No mucosal edema.  Mouth/Throat: No oropharyngeal exudate or posterior oropharyngeal edema.  Eyes: EOM and lids are normal. Pupils are equal, round, and reactive to light.  Conjunctiva pale  Neck: No JVD present. Carotid bruit is not present. No edema present. No thyroid mass and no thyromegaly present.  Cardiovascular: S1 normal and S2 normal.  Exam reveals no gallop.   No murmur heard. Pulses:      Dorsalis pedis pulses are 2+ on the right side, and 2+ on the left side.  Respiratory: No respiratory distress. She has no wheezes. She has no rhonchi. She has no rales.  GI: Soft. Bowel sounds are normal. There is no tenderness.  Musculoskeletal:       Right ankle: She  exhibits swelling.       Left ankle: She exhibits swelling.  Lymphadenopathy:    She has no cervical adenopathy.  Neurological: She is alert and oriented to person, place, and time. No cranial nerve deficit.  Skin: Skin is warm. Nails show no clubbing.  Do not see any drainage from prior surgical wound  Psychiatric: She has a normal mood and affect.      Data Reviewed: Basic Metabolic Panel:  Recent Labs Lab 04/24/16 0700 04/28/16 1615 04/28/16 2140 04/29/16 0626  NA 139 137 137 137  K 4.1 5.4* 5.1 4.8  CL 104 102 104 107  CO2 30 30 27 27   GLUCOSE 97 103* 120* 106*  BUN 19 72* 77* 66*  CREATININE 1.22* 1.70* 1.88* 1.66*  CALCIUM 9.5 10.2 10.0 9.4  MG  --  2.0  --   --    Liver Function Tests:  Recent Labs Lab 04/24/16 0700 04/28/16 1615  AST 19 16  ALT 13* 12*  ALKPHOS 67 50  BILITOT 0.3 0.3  PROT 5.7* 5.2*  ALBUMIN 3.0* 2.9*   CBC:  Recent Labs Lab 04/24/16 0700 04/28/16 1615 04/28/16 2140 04/29/16 0626  WBC 10.0 11.6* 16.0* 13.5*  NEUTROABS 6.9* 8.0* 12.3*  --   HGB 9.0* 5.8* 5.7* 7.6*  HCT 27.0* 18.3* 17.3* 23.0*  MCV 87.9 88.7 88.1 88.5  PLT 351 354 348 303    CBG:  Recent Labs Lab 04/28/16 1135 04/28/16 1655 04/29/16 0213 04/29/16 3086  04/29/16 1139  GLUCAP 109* 100* 112* 124* 94    Recent Results (from the past 240 hour(s))  MRSA PCR Screening     Status: Abnormal   Collection Time: 04/29/16  1:30 AM  Result Value Ref Range Status   MRSA by PCR (A) NEGATIVE Final    INVALID, UNABLE TO DETERMINE THE PRESENCE OF TARGET DNA DUE TO SPECIMEN INTEGRITY. RECOLLECTION REQUESTED.    Comment:        The GeneXpert MRSA Assay (FDA approved for NASAL specimens only), is one component of a comprehensive MRSA colonization surveillance program. It is not intended to diagnose MRSA infection nor to guide or monitor treatment for MRSA infections. REQUEST FOR RECOLLECT AND REORDER CALLED TO JACKIE PAGE AT 0636 ON 04/29/16 [facility].   MRSA PCR  Screening     Status: None   Collection Time: 04/29/16  6:40 AM  Result Value Ref Range Status   MRSA by PCR NEGATIVE NEGATIVE Final    Comment:        The GeneXpert MRSA Assay (FDA approved for NASAL specimens only), is one component of a comprehensive MRSA colonization surveillance program. It is not intended to diagnose MRSA infection nor to guide or monitor treatment for MRSA infections.      Studies: Dg Chest Portable 1 View  Result Date: 04/28/2016 CLINICAL DATA:  Altered mental status and weakness EXAM: PORTABLE CHEST 1 VIEW COMPARISON:  Chest radiograph 04/10/2016 FINDINGS: The right hemidiaphragm remains elevated. There is bibasilar atelectasis. No focal airspace consolidation. No pneumothorax or sizable pleural effusion. Large hiatal hernia. IMPRESSION: Shallow lung inflation with persistent elevation of the right hemidiaphragm. No focal consolidation. Electronically Signed   By: Deatra Robinson M.D.   On: 04/28/2016 21:41    Scheduled Meds: . atorvastatin  40 mg Oral q1800  . citalopram  20 mg Oral Daily  . gabapentin  300 mg Oral BID  . haloperidol  0.5 mg Oral QHS  . insulin aspart  0-9 Units Subcutaneous Q6H  . levothyroxine  50 mcg Oral QAC breakfast  . nortriptyline  75 mg Oral QHS  . [START ON 05/02/2016] pantoprazole  40 mg Intravenous Q12H   Continuous Infusions: . pantoprozole (PROTONIX) infusion 8 mg/hr (04/29/16 0541)    Assessment/Plan:  1. Acute gastrointestinal bleed with melena. Patient placed on Protonix drip. Patient's initial hemoglobin was 5.7. After 2 units of blood and increased to 7.6. Since the patient still looked pale and had another episode of melena at 10 AM I will give another unit of blood and recheck a hemoglobin this evening. Gastrointestinal consultation pending. Hold Celebrex. 2. Type 2 diabetes mellitus without complication. Sliding scale for now since the patient is nothing by mouth 3. Diabetic neuropathy on  gabapentin 4. Hyperlipidemia unspecified on atorvastatin 5. Anxiety depression on Celexa 6. Hypothyroidism unspecified on levothyroxine 7. Acute kidney injury hold Bactrim. Gentle IV fluid hydration and blood transfusions 8. History of hypertension initial blood pressure was on the lower side. Antihypertensive medications on hold for now 9. Recent hospitalization and placed on Bactrim by Dr. Sampson Goon. With the acute kidney injury I will hold this medication. 10. Numerous orthopedic surgeries in the recent past. Family recommended Dr. Ernest Pine orthopedic surgeries to see the patient  Code Status:     Code Status Orders        Start     Ordered   04/29/16 0132  Full code  Continuous     04/29/16 0131    Code Status History  Date Active Date Inactive Code Status Order ID Comments User Context   04/16/2016  2:02 AM 04/21/2016 11:57 PM Full Code [id number]  Oralia Manisavid Willis, MD Inpatient   03/19/2016  3:10 PM 03/22/2016  5:15 PM Full Code [id number]  Donato HeinzJames P Hooten, MD Inpatient   01/15/2016  8:06 PM 01/16/2016  6:23 PM Full Code [id number]  Enid Baasadhika Kalisetti, MD ED   01/12/2016  3:43 PM 01/14/2016 10:03 PM Full Code [id number]  Donato HeinzJames P Hooten, MD Inpatient    Advance Directive Documentation   Flowsheet Row Most Recent Value  Type of Advance Directive  Healthcare Power of Attorney, Living will  Pre-existing out of facility DNR order (yellow form or pink MOST form)  Pink MOST form placed in chart (order not valid for inpatient use)  "MOST" Form in Place?  No data     Family Communication: Spoke with daughter on the phone Disposition Plan: To be determined  Consultants:  Gastroenterology  Time spent: 28 minutes  Alford HighlandWIETING, Marqus Macphee  [employer]

## 2016-04-29 NOTE — Clinical Social Work Note (Signed)
Clinical Social Work Assessment  Patient Details  Name: Leslie Weber MRN: [id number] Date of Birth: 1940/12/18  Date of referral:  04/29/16               Reason for consult:  Facility Placement, Other (Comment Required) (From [facility])                Permission sought to share information with:  Chartered certified accountant granted to share information::  Yes, Verbal Permission Granted  Name::      [place]::   [place]   Relationship::     Contact Information:     Housing/Transportation Living arrangements for the past 2 months:  Page of Information:  Patient, Adult Children, Facility Patient Interpreter Needed:  None Criminal Activity/Legal Involvement Pertinent to Current Situation/Hospitalization:  No - Comment as needed Significant Relationships:  Adult Children, Spouse Lives with:  Facility Resident Do you feel safe going back to the place where you live?  Yes Need for family participation in patient care:  Yes (Comment)  Care giving concerns:  Patient is from [facility] for short term rehab.    Social Worker assessment / plan:  Holiday representative (CSW) reviewed chart and noted that patient is from [facility] CSW is familiar with patient from previous admission. Per [facility] admissions coordinator at [facility] patient can return only if they have a bed available on the day patient is ready for D/C. CSW met with patient's daughter/ HPOA Dorian Pod [phone]. Per Dorian Pod patient is making progress with PT and stood up on her leg and was able to bear weight last Tuesday. Per Dorian Pod she herself is in a better place now emotionally. Daughter would like for patient to return to [facility]. FL2 complete and faxed out. CSW will continue to follow and assist as needed.   Employment status:  Retired Nurse, adult PT Recommendations:  Not assessed at this time Information /  Referral to community resources:  [place]  Patient/Family's Response to care:  Daughter prefers for patient to return to [facility]   Patient/Family's Understanding of and Emotional Response to Diagnosis, Current Treatment, and Prognosis:  Patient and daughter were pleasant and thanked CSW for visit.   Emotional Assessment Appearance:  Appears stated age Attitude/Demeanor/Rapport:    Affect (typically observed):  Accepting, Adaptable, Pleasant Orientation:  Oriented to Self, Oriented to Place, Oriented to  Time, Oriented to Situation Alcohol / Substance use:  Not Applicable Psych involvement (Current and /or in the community):  No (Comment)  Discharge Needs  Concerns to be addressed:  Discharge Planning Concerns Readmission within the last 30 days:  No Current discharge risk:  Dependent with Mobility Barriers to Discharge:  Continued Medical Work up   [employer], Veronia Beets, LCSW 04/29/2016, 1:29 PM

## 2016-04-29 NOTE — Plan of Care (Signed)
Problem: Education: Goal: Ability to identify signs and symptoms of gastrointestinal bleeding will improve Outcome: Progressing  No active bleeding noted this shift, received 2 units of pRBC, tolerated well, no adverse reactions noted, will continue to monitor.

## 2016-04-29 NOTE — [place] FL2 (Addendum)
[place] MEDICA[place] FL2 LEVEL OF CARE SCREENING TOOL     IDENTIFICATION  Patient Name: Leslie Weber Birthdate: Apr 27, 1940 Sex: female Admission Date (Current Location): 04/28/2016  [place] and [place] Number:  Chiropodist and Address:  [facility], [address] [place], [place] [address]      Provider Number: [id number]  Attending Physician Name and Address:  Alford Highland, MD  Relative Name and Phone Number:       Current Level of Care: Hospital Recommended Level of Care: Skilled Nursing Facility Prior Approval Number:    Date Approved/Denied:   PASRR Number:  [id number])  Discharge Plan: SNF    Current Diagnoses: Patient Active Problem List   Diagnosis Date Noted  . GI bleed 04/28/2016  . Delirium due to sepsis 04/18/2016  . Major neurocognitive disorder, due to brain atrophy and cerebrovascular disease 04/18/2016  . Sepsis (HCC) 04/15/2016  . UTI (urinary tract infection) 04/15/2016  . Patellar fracture 03/19/2016  . Wound dehiscence 02/17/2016  . S/P total knee arthroplasty 01/12/2016  . Pulmonary arterial hypertension 11/04/2015  . 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 Obesity 05/03/2014  . Peripheral neuropathy (HCC) 05/03/2014  . Spinal stenosis 05/03/2014  . Hypothyroidism 05/03/2014  . Hypertension 05/03/2014  . HLD (hyperlipidemia) 05/03/2014  . Coronary artery disease 05/03/2014  . DDD (degenerative disc disease), lumbar 01/08/2014    Orientation RESPIRATION BLADDER Height & Weight     Self, Place  O2 (Nasal Cannula 2L/min) Continent Weight: 208 lb 1.6 oz (94.4 kg) Height:  5\' 7"  (170.2 cm)  BEHAVIORAL SYMPTOMS/MOOD NEUROLOGICAL BOWEL NUTRITION STATUS   (None. )  (None. ) Continent Tolerating liquids  AMBULATORY STATUS COMMUNICATION OF NEEDS Skin   Extensive Assist Verbally PU Stage and Appropriate Care (Pressure Injury: Left Foot Stage II)                       Personal Care  Assistance Level of Assistance  Bathing, Feeding, Dressing Bathing Assistance: Limited assistance Feeding assistance: Independent Dressing Assistance: Limited assistance     Functional Limitations Info  Sight, Hearing, Speech Sight Info: Adequate Hearing Info: Adequate Speech Info: Adequate    SPECIAL CARE FACTORS FREQUENCY  PT (By licensed PT), OT (By licensed OT)     PT Frequency:  (5) OT Frequency:  (5)            Contractures      Additional Factors Info  Code Status, Allergies, Insulin Sliding Scale Code Status Info:  (Full Code) Allergies Info:  (Oxycodone)   Insulin Sliding Scale Info:  (NovoLog)       Current Medications (04/29/2016):  This is the current hospital active medication list Current Facility-Administered Medications  Medication Dose Route Frequency Provider Last Rate Last Dose  . 0.9 %  sodium chloride infusion   Intravenous Continuous Alford Highland, MD 40 mL/hr at 04/29/16 [phone]    . acetaminophen (TYLENOL) tablet 650 mg  650 mg Oral Q6H PRN Oralia Manis, MD   650 mg at 04/29/16 0301   Or  . acetaminophen (TYLENOL) suppository 650 mg  650 mg Rectal Q6H PRN Oralia Manis, MD      . atorvastatin (LIPITOR) tablet 40 mg  40 mg Oral q1800 Oralia Manis, MD      . citalopram (CELEXA) tablet 20 mg  20 mg Oral Daily Oralia Manis, MD   20 mg at 04/29/16 0957  . gabapentin (NEURONTIN) tablet 300 mg  300 mg Oral  BID Oralia Manisavid Willis, MD   300 mg at 04/29/16 [id number]  . haloperidol (HALDOL) tablet 0.5 mg  0.5 mg Oral QHS Oralia Manisavid Willis, MD      . insulin aspart (novoLOG) injection 0-9 Units  0-9 Units Subcutaneous Q6H Oralia Manisavid Willis, MD   1 Units at 04/29/16 1003  . levothyroxine (SYNTHROID, LEVOTHROID) tablet 50 mcg  50 mcg Oral QAC breakfast Oralia Manisavid Willis, MD   50 mcg at 04/29/16 0957  . LORazepam (ATIVAN) tablet 0.5 mg  0.5 mg Oral QHS PRN Oralia Manisavid Willis, MD   0.5 mg at 04/29/16 0344  . nortriptyline (PAMELOR) capsule 75 mg  75 mg Oral QHS Oralia Manisavid Willis, MD   75 mg at  04/29/16 0217  . ondansetron (ZOFRAN) tablet 4 mg  4 mg Oral Q6H PRN Oralia Manisavid Willis, MD       Or  . ondansetron [facility](ZOFRAN) injection 4 mg  4 mg Intravenous Q6H PRN Oralia Manisavid Willis, MD      . pantoprazole (PROTONIX) 80 mg in sodium chloride 0.9 % 250 mL (0.32 mg/mL) infusion  8 mg/hr Intravenous Continuous Nita Sicklearolina Veronese, MD 25 mL/hr at 04/29/16 0541 8 mg/hr at 04/29/16 0541  . [START ON 05/02/2016] pantoprazole (PROTONIX) injection 40 mg  40 mg Intravenous Q12H Nita Sicklearolina Veronese, MD         Discharge Medications: Please see discharge summary for a list of discharge medications.  Relevant Imaging Results:  Relevant Lab Results:   Additional Information  (SSN: [id number])  Ralene BatheMackenzie Rayfield, Student-Social Work

## 2016-04-30 ENCOUNTER — Encounter
Admission: EM | Disposition: A | Discharge status: Home / Self Care | Payer: Self-pay | Source: Home / Self Care | Attending: Internal Medicine

## 2016-04-30 ENCOUNTER — Inpatient Hospital Stay: Payer: Medicare Other | Admitting: Anesthesiology

## 2016-04-30 DIAGNOSIS — D62 Acute posthemorrhagic anemia: Secondary | ICD-10-CM | POA: Insufficient documentation

## 2016-04-30 DIAGNOSIS — R4182 Altered mental status, unspecified: Secondary | ICD-10-CM | POA: Insufficient documentation

## 2016-04-30 DIAGNOSIS — L8990 Pressure ulcer of unspecified site, unspecified stage: Secondary | ICD-10-CM | POA: Insufficient documentation

## 2016-04-30 HISTORY — PX: ESOPHAGOGASTRODUODENOSCOPY (EGD) WITH PROPOFOL: SHX5813

## 2016-04-30 LAB — TYPE AND SCREEN
BLOOD PRODUCT EXPIRATION DATE: 201802152359
BLOOD PRODUCT EXPIRATION DATE: 201802282359
Blood Product Expiration Date: 201802182359
ISSUE DATE / TIME: 201802072325
ISSUE DATE / TIME: 201802080247
ISSUE DATE / TIME: 201802081423
UNIT TYPE AND RH: 5100
UNIT TYPE AND RH: 6200
Unit Type and Rh: 5100

## 2016-04-30 LAB — BASIC METABOLIC PANEL
Anion gap: 5 (ref 5–15)
BUN: 43 mg/dL — AB (ref 6–20)
CHLORIDE: 108 mmol/L (ref 101–111)
CO2: 27 mmol/L (ref 22–32)
CREATININE: 1.24 mg/dL — AB (ref 0.44–1.00)
Calcium: 9.2 mg/dL (ref 8.9–10.3)
GFR calc Af Amer: 49 mL/min — ABNORMAL LOW (ref >60)
GFR, EST NON AFRICAN AMERICAN: 42 mL/min — AB (ref >60)
Glucose, Bld: 79 mg/dL (ref 65–99)
Potassium: 4.3 mmol/L (ref 3.5–5.1)
SODIUM: 140 mmol/L (ref 135–145)

## 2016-04-30 LAB — HEMOGLOBIN
HEMOGLOBIN: 8.6 g/dL — AB (ref 12.0–16.0)
HEMOGLOBIN: 8.8 g/dL — AB (ref 12.0–16.0)
Hemoglobin: 9.4 g/dL — ABNORMAL LOW (ref 12.0–16.0)

## 2016-04-30 LAB — GLUCOSE, CAPILLARY
GLUCOSE-CAPILLARY: 76 mg/dL (ref 65–99)
GLUCOSE-CAPILLARY: 89 mg/dL (ref 65–99)
Glucose-Capillary: 77 mg/dL (ref 65–99)
Glucose-Capillary: 81 mg/dL (ref 65–99)
Glucose-Capillary: 85 mg/dL (ref 65–99)
Glucose-Capillary: 91 mg/dL (ref 65–99)

## 2016-04-30 SURGERY — ESOPHAGOGASTRODUODENOSCOPY (EGD) WITH PROPOFOL
Anesthesia: General

## 2016-04-30 MED ORDER — PROPOFOL 500 MG/50ML IV EMUL
INTRAVENOUS | Status: DC | PRN
  Administered 2016-04-30: 120 ug/kg/min via INTRAVENOUS

## 2016-04-30 MED ORDER — PANTOPRAZOLE SODIUM 40 MG PO TBEC
40.0000 mg | DELAYED_RELEASE_TABLET | Freq: Two times a day (BID) | ORAL | Status: DC
  Administered 2016-04-30 – 2016-05-01 (×3): 40 mg via ORAL
  Filled 2016-04-30 (×3): qty 1

## 2016-04-30 MED ORDER — ACETAMINOPHEN 325 MG PO TABS: 650.0000 mg | ORAL_TABLET | Freq: Four times a day (QID) | ORAL | 2 refills | Status: AC | PRN

## 2016-04-30 MED ORDER — PANTOPRAZOLE SODIUM 40 MG PO TBEC
40.0000 mg | DELAYED_RELEASE_TABLET | Freq: Two times a day (BID) | ORAL | 2 refills | Status: AC
Start: 2016-04-30 — End: ?

## 2016-04-30 MED ORDER — PROPOFOL 10 MG/ML IV BOLUS
INTRAVENOUS | Status: AC
  Filled 2016-04-30: qty 20

## 2016-04-30 MED ORDER — INSULIN ASPART 100 UNIT/ML ~~LOC~~ SOLN
0.0000 [IU] | Freq: Three times a day (TID) | SUBCUTANEOUS | Status: DC
  Administered 2016-05-01: 2 [IU] via SUBCUTANEOUS
  Filled 2016-04-30: qty 2

## 2016-04-30 MED ORDER — PROPOFOL 10 MG/ML IV BOLUS
INTRAVENOUS | Status: DC | PRN
  Administered 2016-04-30: 20 mg via INTRAVENOUS

## 2016-04-30 NOTE — Progress Notes (Signed)
REPORT TO DONNA ON 1C.TOLERATED LIQUIDS WITHOUT INCIDENT

## 2016-04-30 NOTE — Care Management Important Message (Signed)
Important Message  Patient Details  Name: Leslie Weber MRN: [id number] Date of Birth: Mar 18, 1941   Medicare Important Message Given:  Yes    Gwenette GreetBrenda S Clem Wisenbaker, RN 04/30/2016, 12:24 PM

## 2016-04-30 NOTE — Anesthesia Post-op Follow-up Note (Signed)
Anesthesia QCDR form completed.        

## 2016-04-30 NOTE — Progress Notes (Signed)
Plan is for [facility] if [facility]. RN will call report at [phone]. Clinical Child psychotherapistocial Worker (CSW) sent D/C Summary to [facility] today via [facility]. CSW will continue to follow and assist as needed.    [employer]Bailey Sharyl Panchal, LCSW [phone]

## 2016-04-30 NOTE — Discharge Summary (Signed)
[facility] [place] at [facility]   PATIENT NAME: Leslie Weber    MR#:  [id number]  DATE OF BIRTH:  Jan 29, 1941  DATE OF ADMISSION:  04/28/2016   ADMITTING PHYSICIAN: Oralia Manis, MD  DATE OF DISCHARGE: 04/30/2016  PRIMARY CARE PHYSICIAN: Rafael Bihari, MD   ADMISSION DIAGNOSIS:   Delirium [R41.0] AKI (acute kidney injury) (HCC) [N17.9] UGIB (upper gastrointestinal bleed) [K92.2]  DISCHARGE DIAGNOSIS:   Principal Problem:   GI bleed Active Problems:   Hypothyroidism   Hypertension   HLD (hyperlipidemia)   Coronary artery disease   Type 2 diabetes with nephropathy (HCC)   Major neurocognitive disorder, due to brain atrophy and cerebrovascular disease   Pressure injury of skin   SECONDARY DIAGNOSIS:   Past Medical History:  Diagnosis Date  . Arthritis   . Coronary artery disease    a. 11/2014 CT chest w/ coronary atherosclerosis.  . Depression   . Diabetes mellitus without complication (HCC)   . Fever due to malaria    76 years old  . Fibromyalgia   . GERD (gastroesophageal reflux disease)   . History of hiatal hernia   . Hypertension   . Hypothyroidism   . Neuromuscular disorder (HCC)    Bells palsy left side of face  . Neuropathy of both feet   . Osteoarthritis    a. s/p R TKA 12/2015.  Marland Kitchen Pneumonia 1997   history of walking pneumonia  . Sleep apnea    not yet had sleep study    HOSPITAL COURSE:   76 year old female with multiple medical problems including recurrent falls requiring right knee surgeries, left ankle fracture in an immobilizer in status post surgery, diabetes, hypertension, neuromuscular disorder, neuropathy, coronary artery disease presents to hospital from rehabilitation secondary to altered mental status and was noted to be anemic.  #1 acute on chronic anemia-secondary to slow GI bleeding. Has been having melanotic stools. -Baseline hemoglobin around 9, admission hemoglobin was 6. -Received total of 3 units of  packed RBC transfusion this admission and hemoglobin today is at 8.8. -Continue Protonix. GI has been consulted. -For upper endoscopy today. Has had prior history of hiatal hernia and esophageal dilatations for stricture in the past -Hold NSAIDS  #2 diabetes mellitus-since intake has been low, only on sliding scale insulin. Monitor sugars.  #3 acute renal failure on admission-Bactrim discontinued. Gentle hydration and improving creatinine. -Urine analysis on admission did not show any bacteria at this time  #4 hypertension-not on any medications. Monitor  #5 hypothyroidism-on Synthroid  #6 depression-continue home medications.  #7 DVT prophylaxis-Ted's and SCDs. No heparin products due to GI bleed   Family requested orthopedic consult due to recurrent orthopedic surgeries recently. -Will be discharged back to rehabilitation when stable. Continue physical therapy at the time with restrictions about weight bearing as prior.  DISCHARGE CONDITIONS:   Guarded  CONSULTS OBTAINED:   Treatment Team:  Midge Minium, MD Donato Heinz, MD  DRUG ALLERGIES:   Allergies  Allergen Reactions  . Oxycodone Other (See Comments)    "stopped breathing"   DISCHARGE MEDICATIONS:   Allergies as of 04/30/2016      Reactions   Oxycodone Other (See Comments)   "stopped breathing"      Medication List    STOP taking these medications   celecoxib 200 MG capsule Commonly known as:  CELEBREX   metoprolol tartrate 25 MG tablet Commonly known as:  LOPRESSOR   oseltamivir 75 MG capsule Commonly known as:  TAMIFLU   sulfamethoxazole-trimethoprim 800-160 MG tablet Commonly known as:  BACTRIM DS,SEPTRA DS   VICTOZA 18 MG/3ML Sopn Generic drug:  liraglutide     TAKE these medications   acetaminophen 325 MG tablet Commonly known as:  TYLENOL Take 2 tablets (650 mg total) by mouth every 6 (six) hours as needed for mild pain (or Fever >/= 101). What changed:  medication  strength  how much to take  when to take this  reasons to take this   atorvastatin 40 MG tablet Commonly known as:  LIPITOR Take 40 mg by mouth every morning.   CALCIUM 600+D 600-800 MG-UNIT Tabs Generic drug:  Calcium Carb-Cholecalciferol Take 1 Dose by mouth 2 (two) times daily.   CENTRUM WOMEN PO Take 1 tablet by mouth every morning.   citalopram 20 MG tablet Commonly known as:  CELEXA Take 20 mg by mouth every morning.   ferrous sulfate 325 (65 FE) MG tablet Take 325 mg by mouth daily with breakfast.   gabapentin 600 MG tablet Commonly known as:  NEURONTIN Take 0.5 tablets (300 mg total) by mouth 2 (two) times daily.   haloperidol 0.5 MG tablet Commonly known as:  HALDOL Take 1 tablet (0.5 mg total) by mouth at bedtime.   levothyroxine 50 MCG tablet Commonly known as:  SYNTHROID, LEVOTHROID Take 50 mcg by mouth daily before breakfast.   LORazepam 0.5 MG tablet Commonly known as:  ATIVAN Take 1 tablet (0.5 mg total) by mouth at bedtime as needed for anxiety.   nortriptyline 75 MG capsule Commonly known as:  PAMELOR Take 75 mg by mouth at bedtime.   pantoprazole 40 MG tablet Commonly known as:  PROTONIX Take 1 tablet (40 mg total) by mouth 2 (two) times daily. What changed:  when to take this   traMADol 50 MG tablet Commonly known as:  ULTRAM Take 1 tablet (50 mg total) by mouth every 6 (six) hours as needed for moderate pain.        DISCHARGE INSTRUCTIONS:   1. PCP f/u in 1 week 2. Ortho f/u as prior scheduled  DIET:   Cardiac diet  ACTIVITY:   Activity as tolerated  OXYGEN:   Home Oxygen: Yes.    Oxygen Delivery: 2 liters/min via Patient connected to nasal cannula oxygen  DISCHARGE LOCATION:   nursing home   If you experience worsening of your admission symptoms, develop shortness of breath, life threatening emergency, suicidal or homicidal thoughts you must seek medical attention immediately by calling 911 or calling your MD  immediately  if symptoms less severe.  You Must read complete instructions/literature along with all the possible adverse reactions/side effects for all the Medicines you take and that have been prescribed to you. Take any new Medicines after you have completely understood and accpet all the possible adverse reactions/side effects.   Please note  You were cared for by a hospitalist during your hospital stay. If you have any questions about your discharge medications or the care you received while you were in the hospital after you are discharged, you can call the unit and asked to speak with the hospitalist on call if the hospitalist that took care of you is not available. Once you are discharged, your primary care physician will handle any further medical issues. Please note that NO REFILLS for any discharge medications will be authorized once you are discharged, as it is imperative that you return to your primary care physician (or establish a relationship with a primary care physician  if you do not have one) for your aftercare needs so that they can reassess your need for medications and monitor your lab values.    On the day of Discharge:  VITAL SIGNS:   Blood pressure 139/66, pulse 87, temperature 97.9 F (36.6 C), temperature source Oral, resp. rate 18, height 5\' 7"  (1.702 m), weight 94.4 kg (208 lb 1.6 oz), SpO2 100 %.  PHYSICAL EXAMINATION:    GENERAL:  76 y.o.-year-old frail appearing elderly patient lying in the bed with no acute distress.  EYES: Pupils equal, round, reactive to light and accommodation. No scleral icterus. Extraocular muscles intact. Pale conjunctiva HEENT: Head atraumatic, normocephalic. Oropharynx and nasopharynx clear.  NECK:  Supple, no jugular venous distention. No thyroid enlargement, no tenderness.  LUNGS: Normal breath sounds bilaterally, no wheezing, rales,rhonchi or crepitation. No use of accessory muscles of respiration. Decreased bibasilar breath  sounds CARDIOVASCULAR: S1, S2 normal. No rubs, or gallops. 3/6 systolic murmur present. ABDOMEN: Soft, nontender, nondistended. Bowel sounds present. No organomegaly or mass.  EXTREMITIES: Right knee in a brace, left ankle in a brace as well  NEUROLOGIC: Cranial nerves II through XII are intact. Moving upper extremities well, lower extremity movement limited due to braces. Sensation intact. Gait not checked. Global weakness PSYCHIATRIC: The patient is alert and oriented x 2. Very fatigued today and prefers going back to sleep. SKIN: No obvious rash, lesion, or ulcer.  DATA REVIEW:   CBC  Recent Labs Lab 04/29/16 0626  04/30/16 0855  WBC 13.5*  --   --   HGB 7.6*  < > 8.8*  HCT 23.0*  --   --   PLT 303  --   --   < > = values in this interval not displayed.  Chemistries   Recent Labs Lab 04/28/16 1615  04/30/16 0106  NA 137  < > 140  K 5.4*  < > 4.3  CL 102  < > 108  CO2 30  < > 27  GLUCOSE 103*  < > 79  BUN 72*  < > 43*  CREATININE 1.70*  < > 1.24*  CALCIUM 10.2  < > 9.2  MG 2.0  --   --   AST 16  --   --   ALT 12*  --   --   ALKPHOS 50  --   --   BILITOT 0.3  --   --   < > = values in this interval not displayed.   Microbiology Results  Results for orders placed or performed during the hospital encounter of 04/28/16  Urine culture     Status: Abnormal (Preliminary result)   Collection Time: 04/28/16  9:53 PM  Result Value Ref Range Status   Specimen Description URINE, RANDOM  Final   Special Requests NONE  Final   Culture (A)  Final    >=100,000 COLONIES/mL ENTEROCOCCUS FAECIUM SUSCEPTIBILITIES TO FOLLOW Performed at [facility], [address], [place], [place] [address]    Report Status PENDING  Incomplete  MRSA PCR Screening     Status: Abnormal   Collection Time: 04/29/16  1:30 AM  Result Value Ref Range Status   MRSA by PCR (A) NEGATIVE Final    INVALID, UNABLE TO DETERMINE THE PRESENCE OF TARGET DNA DUE TO SPECIMEN INTEGRITY. RECOLLECTION  REQUESTED.    Comment:        The GeneXpert MRSA Assay (FDA approved for NASAL specimens only), is one component of a comprehensive MRSA colonization surveillance program. It  is not intended to diagnose MRSA infection nor to guide or monitor treatment for MRSA infections. REQUEST FOR RECOLLECT AND REORDER CALLED TO JACKIE PAGE AT 0636 ON 04/29/16 [facility].   MRSA PCR Screening     Status: None   Collection Time: 04/29/16  6:40 AM  Result Value Ref Range Status   MRSA by PCR NEGATIVE NEGATIVE Final    Comment:        The GeneXpert MRSA Assay (FDA approved for NASAL specimens only), is one component of a comprehensive MRSA colonization surveillance program. It is not intended to diagnose MRSA infection nor to guide or monitor treatment for MRSA infections.     RADIOLOGY:  No results found.   Management plans discussed with the patient, family and they are in agreement.  CODE STATUS:     Code Status Orders        Start     Ordered   04/29/16 0132  Full code  Continuous     04/29/16 0131    Code Status History    Date Active Date Inactive Code Status Order ID Comments User Context   04/16/2016  2:02 AM 04/21/2016 11:57 PM Full Code [id number]  Oralia Manis, MD Inpatient   03/19/2016  3:10 PM 03/22/2016  5:15 PM Full Code [id number]  Donato Heinz, MD Inpatient   01/15/2016  8:06 PM 01/16/2016  6:23 PM Full Code [id number]  Enid Baas, MD ED   01/12/2016  3:43 PM 01/14/2016 10:03 PM Full Code [id number]  Donato Heinz, MD Inpatient    Advance Directive Documentation   Flowsheet Row Most Recent Value  Type of Advance Directive  Healthcare Power of Attorney, Living will  Pre-existing out of facility DNR order (yellow form or pink MOST form)  Pink MOST form placed in chart (order not valid for inpatient use)  "MOST" Form in Place?  No data      TOTAL TIME TAKING CARE OF THIS PATIENT: 38 minutes.    Rupa Lagan M.D on 04/30/2016 at 1:28 PM  Between 7am to 6pm -  Pager - [phone]  After 6pm go to [email or web address] - Social research officer, government  [facility] [facility]  Office  [phone]  CC: Primary care physician; Rafael Bihari, MD   Note: This dictation was prepared with Dragon dictation along with smaller phrase technology. Any transcriptional errors that result from this process are unintentional.

## 2016-04-30 NOTE — Anesthesia Preprocedure Evaluation (Signed)
Anesthesia Evaluation  Patient identified by MRN, date of birth, ID band Patient awake    Reviewed: Allergy & Precautions  Airway Mallampati: III       Dental  (+) Teeth Intact   Pulmonary sleep apnea ,     + decreased breath sounds      Cardiovascular Exercise Tolerance: Poor hypertension, Pt. on home beta blockers + CAD   Rhythm:Regular     Neuro/Psych Depression    GI/Hepatic Neg liver ROS, hiatal hernia,   Endo/Other  diabetes, Type 1, Insulin DependentHypothyroidism Morbid obesity  Renal/GU CRFRenal disease     Musculoskeletal  (+) Arthritis , Osteoarthritis,    Abdominal (+) + obese,   Peds  Hematology  (+) anemia ,   Anesthesia Other Findings   Reproductive/Obstetrics                             Anesthesia Physical Anesthesia Plan  ASA: III  Anesthesia Plan: General   Post-op Pain Management:    Induction:   Airway Management Planned: Natural Airway and Nasal Cannula  Additional Equipment:   Intra-op Plan:   Post-operative Plan:   Informed Consent: I have reviewed the patients History and Physical, chart, labs and discussed the procedure including the risks, benefits and alternatives for the proposed anesthesia with the patient or authorized representative who has indicated his/her understanding and acceptance.     Plan Discussed with: Surgeon  Anesthesia Plan Comments:         Anesthesia Quick Evaluation

## 2016-04-30 NOTE — Transfer of Care (Signed)
Immediate Anesthesia Transfer of Care Note  Patient: Demetria PoreGean I Bogart  Procedure(s) Performed: Procedure(s): ESOPHAGOGASTRODUODENOSCOPY (EGD) WITH PROPOFOL (N/A)  Patient Location: PACU and Endoscopy Unit  Anesthesia Type:General  Level of Consciousness: awake  Airway & Oxygen Therapy: Patient Spontanous Breathing and Patient connected to nasal cannula oxygen  Post-op Assessment: Report given to RN  Post vital signs: Reviewed  Last Vitals:  Vitals:   04/30/16 1131 04/30/16 1342  BP: 139/66 127/62  Pulse: 87 90  Resp: 18 15  Temp: 36.6 C 36.8 C    Last Pain:  Vitals:   04/30/16 1342  TempSrc: Tympanic  PainSc:          Complications: No apparent anesthesia complications

## 2016-04-30 NOTE — Op Note (Addendum)
[facility] Gastroenterology Patient Name: Leslie DillingGean Nawrot Procedure Date: 04/30/2016 1:26 PM MRN: [id number] Account #: [id number] Date of Birth: 1940-11-02 Admit Type: Inpatient Age: [age] [facility] ROOM 4 Gender: Female Note Status: Finalized Procedure:            Upper GI endoscopy Indications:          Melena Providers:            Midge Miniumarren Kiylee Thoreson MD, MD Referring MD:         Letta PateJohn B. Danne HarborWalker III, MD (Referring MD) Medicines:            Propofol per Anesthesia Complications:        No immediate complications. Procedure:            Pre-Anesthesia Assessment:                       - Prior to the procedure, a History and Physical was                        performed, and patient medications and allergies were                        reviewed. The patient's tolerance of previous                        anesthesia was also reviewed. The risks and benefits of                        the procedure and the sedation options and risks were                        discussed with the patient. All questions were                        answered, and informed consent was obtained. Prior                        Anticoagulants: The patient has taken no previous                        anticoagulant or antiplatelet agents. ASA Grade                        Assessment: II - A patient with mild systemic disease.                        After reviewing the risks and benefits, the patient was                        deemed in satisfactory condition to undergo the                        procedure.                       After obtaining informed consent, the endoscope was                        passed under direct vision. Throughout the procedure,  the patient's blood pressure, pulse, and oxygen                        saturations were monitored continuously. The Endoscope                        was introduced through the mouth, and advanced to the                        second  part of duodenum. The upper GI endoscopy was                        accomplished without difficulty. The patient tolerated                        the procedure well. Findings:      A large hiatal hernia was present.      The entire examined stomach was normal.      Diffuse moderately erythematous mucosa without active bleeding and with       no stigmata of bleeding was found in the duodenal bulb. Impression:           - Large hiatal hernia.                       - Normal stomach.                       - Erythematous duodenopathy.                       - No specimens collected. Recommendation:       - Return patient to hospital ward for ongoing care.                       - Resume previous diet.                       - Continue present medications. Procedure Code(s):    --- Professional ---                       [phone], Esophagogastroduodenoscopy, flexible, transoral;                        diagnostic, including collection of specimen(s) by                        brushing or washing, when performed (separate procedure) Diagnosis Code(s):    --- Professional ---                       K92.1, Melena (includes Hematochezia)                       K44.9, Diaphragmatic hernia without obstruction or                        gangrene                       K31.89, Other diseases of stomach and duodenum CPT copyright 2016 American Medical Association. All rights reserved. The codes documented in this report are preliminary and upon coder review may  be  revised to meet current compliance requirements. Midge Minium MD, MD 04/30/2016 1:40:03 PM This report has been signed electronically. Number of Addenda: 0 Note Initiated On: 04/30/2016 1:26 PM      [facility]

## 2016-04-30 NOTE — Progress Notes (Signed)
[facility] - [place] at [facility]   PATIENT NAME: Leslie DillingGean Weber    MR#:  [id number]  DATE OF BIRTH:  1940/10/11  SUBJECTIVE:  CHIEF COMPLAINT:   Chief Complaint  Patient presents with  . Abnormal Lab   - Complains of pain all over. No dark stools today. -.Daughter at bedside. For endoscopy today  REVIEW OF SYSTEMS:  Review of Systems  Constitutional: Positive for malaise/fatigue. Negative for chills and fever.  HENT: Negative for ear discharge, hearing loss and nosebleeds.   Eyes: Negative for blurred vision.  Respiratory: Negative for cough, shortness of breath and wheezing.   Cardiovascular: Positive for leg swelling. Negative for chest pain and palpitations.  Gastrointestinal: Negative for abdominal pain, constipation, diarrhea, nausea and vomiting.  Genitourinary: Negative for dysuria.  Musculoskeletal: Positive for joint pain and myalgias.  Neurological: Positive for weakness. Negative for dizziness, speech change, focal weakness, seizures and headaches.  Psychiatric/Behavioral: Negative for depression.    DRUG ALLERGIES:   Allergies  Allergen Reactions  . Oxycodone Other (See Comments)    "stopped breathing"    VITALS:  Blood pressure 139/66, pulse 87, temperature 97.9 F (36.6 C), temperature source Oral, resp. rate 18, height 5\' 7"  (1.702 m), weight 94.4 kg (208 lb 1.6 oz), SpO2 100 %.  PHYSICAL EXAMINATION:  Physical Exam  GENERAL:  76 y.o.-year-old frail appearing elderly patient lying in the bed with no acute distress.  EYES: Pupils equal, round, reactive to light and accommodation. No scleral icterus. Extraocular muscles intact. Pale conjunctiva HEENT: Head atraumatic, normocephalic. Oropharynx and nasopharynx clear.  NECK:  Supple, no jugular venous distention. No thyroid enlargement, no tenderness.  LUNGS: Normal breath sounds bilaterally, no wheezing, rales,rhonchi or crepitation. No use of accessory muscles of respiration. Decreased  bibasilar breath sounds CARDIOVASCULAR: S1, S2 normal. No rubs, or gallops. 3/6 systolic murmur present. ABDOMEN: Soft, nontender, nondistended. Bowel sounds present. No organomegaly or mass.  EXTREMITIES: Right knee in a brace, left ankle in a brace as well  NEUROLOGIC: Cranial nerves II through XII are intact. Moving upper extremities well, lower extremity movement limited due to braces. Sensation intact. Gait not checked. Global weakness PSYCHIATRIC: The patient is alert and oriented x 2. Very fatigued today and prefers going back to sleep. SKIN: No obvious rash, lesion, or ulcer.    LABORATORY PANEL:   CBC  Recent Labs Lab 04/29/16 0626  04/30/16 0855  WBC 13.5*  --   --   HGB 7.6*  < > 8.8*  HCT 23.0*  --   --   PLT 303  --   --   < > = values in this interval not displayed. ------------------------------------------------------------------------------------------------------------------  Chemistries   Recent Labs Lab 04/28/16 1615  04/30/16 0106  NA 137  < > 140  K 5.4*  < > 4.3  CL 102  < > 108  CO2 30  < > 27  GLUCOSE 103*  < > 79  BUN 72*  < > 43*  CREATININE 1.70*  < > 1.24*  CALCIUM 10.2  < > 9.2  MG 2.0  --   --   AST 16  --   --   ALT 12*  --   --   ALKPHOS 50  --   --   BILITOT 0.3  --   --   < > = values in this interval not displayed. ------------------------------------------------------------------------------------------------------------------  Cardiac Enzymes No results for input(s): TROPONINI in the last 168 hours. ------------------------------------------------------------------------------------------------------------------  RADIOLOGY:  Dg  Chest Portable 1 View  Result Date: 04/28/2016 CLINICAL DATA:  Altered mental status and weakness EXAM: PORTABLE CHEST 1 VIEW COMPARISON:  Chest radiograph 04/10/2016 FINDINGS: The right hemidiaphragm remains elevated. There is bibasilar atelectasis. No focal airspace consolidation. No pneumothorax or  sizable pleural effusion. Large hiatal hernia. IMPRESSION: Shallow lung inflation with persistent elevation of the right hemidiaphragm. No focal consolidation. Electronically Signed   By: Deatra Robinson M.D.   On: 04/28/2016 21:41    EKG:   Orders placed or performed during the hospital encounter of 04/28/16  . EKG 12-Lead  . EKG 12-Lead    ASSESSMENT AND PLAN:   76 year old female with multiple medical problems including recurrent falls requiring right knee surgeries, left ankle fracture in an immobilizer in status post surgery, diabetes, hypertension, neuromuscular disorder, neuropathy, coronary artery disease presents to hospital from rehabilitation secondary to altered mental status and was noted to be anemic.  #1 acute on chronic anemia-secondary to slow GI bleeding. Has been having melanotic stools. -Baseline hemoglobin around 9, admission hemoglobin was 6. -Received total of 3 units of packed RBC transfusion this admission and hemoglobin today is at 8.8. -Continue Protonix. GI has been consulted. -For upper endoscopy today. Has had prior history of hiatal hernia and esophageal dilatations for stricture in the past -Hold NSAIDS  #2 diabetes mellitus-since intake has been low, only on sliding scale insulin. Monitor sugars.  #3 acute renal failure on admission-Bactrim discontinued. Gentle hydration and improving creatinine. -Urine analysis on admission did not show any bacteria at this time  #4 hypertension-not on any medications. Monitor  #5 hypothyroidism-on Synthroid  #6 depression-continue home medications.  #7 DVT prophylaxis-Ted's and SCDs. No heparin products due to GI bleed   Family requested orthopedic consult due to recurrent orthopedic surgeries recently. -Will be discharged back to rehabilitation when stable. Continue physical therapy at the time with restrictions about weight bearing as prior.     All the records are reviewed and case discussed with Care  Management/Social Workerr. Management plans discussed with the patient, family and they are in agreement.  CODE STATUS: Full Code  TOTAL TIME TAKING CARE OF THIS PATIENT: 39 minutes.   POSSIBLE D/C IN 2 DAYS, DEPENDING ON CLINICAL CONDITION.   Courtney Fenlon M.D on 04/30/2016 at 1:17 PM  Between 7am to 6pm - Pager - [phone]  After 6pm go to [email or web address] - Social research officer, government  [facility] [place] [facility]  Office  [phone]  CC: Primary care physician; Rafael Bihari, MD

## 2016-04-30 NOTE — Progress Notes (Signed)
Location:      Place of Service:  SNF (31) Provider:  Toni Arthurs, NP-C  Madelyn Brunner, MD  Patient Care Team: Madelyn Brunner, MD as PCP - General (Internal Medicine)  Extended Emergency Contact Information Primary Emergency Contact: [place] Phone: [phone] Relation: Daughter Secondary Emergency Contact: Claud Kelp Address: [place], [place] [address] [place] Phone: [phone] Mobile Phone: [phone] Relation: Spouse  Code Status:  full Goals of care: Advanced Directive information Advanced Directives 04/29/2016  Does Patient Have a Medical Advance Directive? Yes  Type of Paramedic of [place];Living will  Does patient want to make changes to medical advance directive? No - Patient declined  Copy of [place] in Chart? Yes  Would patient like information on creating a medical advance directive? -  Pre-existing out of facility DNR order (yellow form or pink MOST form) -     Chief Complaint  Patient presents with  . Acute Visit    HPI:  Pt is a 76 y.o. female seen today for an acute visit for on-going delirium/AMS. Pt was admitted to the facility for rehab following an extended recovery s/p Left TKA. Pt had been at another rehab facility, got a UTI, went into sepsis, pt was intubated and on a vent in the hospital. Once stable, pt was then admitted for continuation of rehab services. Since admit, pt has been confused, oriented to person/self only. Unable to fully participate in therapy. Family is rightfully concerned the pt does not appear to be getting better. At this time, she is still receiving po antibiotics for the presumed septic uti. Pt is in the recliner at the nurses station d/t her confusion and concerns for her safety. Pt is lethargi, but arousable. Though easily falls back asleep in mid-conversation. Unable to focus.  Unable to follow simple commands. Able to state her name and birthdate, but this appears to be the extent of her orientation. Pt is very pale/ very pale mucus membranes. Mucus membranes are tachy. Pt did report that she did not feel well, but unable to elaborate. Unable to confirm/deny pain. Denies shortness of breath, denies n/v/d/f/c/cp/ha/abd pain. Pt appears very weak. No visible s/s of a cva. Speech very soft and difficult to understand. Will send labs for full work up. VSS. No other complaints.    Past Medical History:  Diagnosis Date  . Arthritis   . Coronary artery disease    a. 11/2014 CT chest w/ coronary atherosclerosis.  . Depression   . Diabetes mellitus without complication ([place])   . Fever due to malaria    77 years old  . Fibromyalgia   . GERD (gastroesophageal reflux disease)   . History of hiatal hernia   . Hypertension   . Hypothyroidism   . Neuromuscular disorder ([place])    Bells palsy left side of face  . Neuropathy of both feet   . Osteoarthritis    a. s/p R TKA 12/2015.  [place] Kitchen Pneumonia 1997   history of walking pneumonia  . Sleep apnea    not yet had sleep study   Past Surgical History:  Procedure Laterality Date  . ABDOMINAL HYSTERECTOMY    . APPENDECTOMY    . BACK SURGERY    . BLADDER SUSPENSION  2002  . CHOLECYSTECTOMY    . KNEE ARTHROPLASTY Right 01/12/2016  Procedure: COMPUTER ASSISTED TOTAL KNEE ARTHROPLASTY;  Surgeon: Dereck Leep, MD;  Location: [facility] ORS;  Service: Orthopedics;  Laterality: Right;  . ORIF ANKLE FRACTURE Left 03/19/2016   Procedure: OPEN REDUCTION INTERNAL FIXATION (ORIF) ANKLE FRACTURE;  Surgeon: Dereck Leep, MD;  Location: [facility] ORS;  Service: Orthopedics;  Laterality: Left;  . ORIF PATELLA Right 03/19/2016   Procedure: OPEN REDUCTION INTERNAL (ORIF) FIXATION PATELLA;  Surgeon: Dereck Leep, MD;  Location: [facility] ORS;  Service: Orthopedics;  Laterality: Right;  . TONSILLECTOMY      Allergies  Allergen Reactions  . Oxycodone  Other (See Comments)    "stopped breathing"    Allergies as of 04/28/2016      Reactions   Oxycodone Other (See Comments)   "stopped breathing"      Medication List    Notice   This visit is during an admission. Changes to the med list made in this visit will be reflected in the After Visit Summary of the admission.     Review of Systems  Unable to perform ROS: Mental status change  Constitutional: Positive for activity change, appetite change and fatigue. Negative for chills, diaphoresis and fever.  HENT: Negative for congestion, sneezing, sore throat, trouble swallowing and voice change.   Eyes: Negative for pain, redness and visual disturbance.  Respiratory: Negative for apnea, cough, choking, chest tightness, shortness of breath and wheezing.   Cardiovascular: Negative for chest pain, palpitations and leg swelling.  Gastrointestinal: Negative for abdominal distention, abdominal pain, constipation, diarrhea and nausea.  Genitourinary: Negative for difficulty urinating, dysuria, frequency and urgency.  Musculoskeletal: Negative for back pain, gait problem and myalgias. Arthralgias: typical arthritis.  Skin: Negative for color change, pallor, rash and wound.  Neurological: Positive for speech difficulty and weakness. Negative for tremors, syncope, facial asymmetry and headaches.  Psychiatric/Behavioral: Positive for confusion, decreased concentration and dysphoric mood. Negative for agitation and behavioral problems.  All other systems reviewed and are negative.   Immunization History  Administered Date(s) Administered  . Influenza,inj,Quad PF,36+ Mos 01/14/2016   There are no preventive care reminders to display for this patient. No flowsheet data found. Functional Status Survey:    Vitals:   04/28/16 0600  BP: (!) 157/69  Pulse: 80  Resp: 19  Temp: 97.7 F (36.5 C)  SpO2: 95%   There is no height or weight on file to calculate BMI. Physical Exam  Constitutional:  Vital signs are normal. She appears well-developed and well-nourished. She appears lethargic. She is sleeping. She is easily aroused. She has a sickly appearance. She does not appear ill. No distress.  HENT:  Head: Normocephalic and atraumatic.  Mouth/Throat: Uvula is midline and oropharynx is clear and moist. Mucous membranes are pale, dry (tachy) and not cyanotic.  Eyes: Conjunctivae, EOM and lids are normal. Pupils are equal, round, and reactive to light.  Neck: Trachea normal, normal range of motion and full passive range of motion without pain. Neck supple. No JVD present. No tracheal deviation, no edema and no erythema present. No thyromegaly present.  Cardiovascular: Normal rate, normal heart sounds and intact distal pulses.  An irregular rhythm present. Exam reveals no gallop, no distant heart sounds and no friction rub.   No murmur heard. Pulses:      Dorsalis pedis pulses are 1+ on the right side, and 1+ on the left side.  Pulmonary/Chest: Effort normal. No accessory muscle usage. No respiratory distress. She has decreased breath sounds in the right lower field and the left  lower field. She has no wheezes. She has no rhonchi. She has no rales. She exhibits no tenderness.  Abdominal: Soft. Normal appearance and bowel sounds are normal. She exhibits no distension and no ascites. There is no tenderness.  Musculoskeletal: She exhibits no edema or tenderness.       Right knee: She exhibits decreased range of motion and swelling.  Expected osteoarthritis, stiffness. Bledsoe brace in place. Calves soft, supple. Negative Homans' sign  Neurological: She is easily aroused. She has normal strength. She appears lethargic.  Skin: Skin is warm, dry and intact. She is not diaphoretic. No cyanosis. There is pallor. Nails show no clubbing.  Skin pale, cool  Psychiatric: Thought content normal. Her affect is blunt. Her speech is slurred. Cognition and memory are impaired. She expresses impulsivity. She  exhibits abnormal recent memory and abnormal remote memory. She is inattentive.  Nursing note and vitals reviewed.   Labs reviewed:  Recent Labs  04/19/16 0458 04/20/16 1058 04/20/16 2028 04/21/16 0445  04/28/16 1615 04/28/16 2140 04/29/16 0626  NA 142  --   --  138  < > 137 137 137  K 3.5  --   --  3.7  < > 5.4* 5.1 4.8  CL 112*  --   --  104  < > 102 104 107  CO2 27  --   --  29  < > 30 27 27   GLUCOSE 92  --   --  106*  < > 103* 120* 106*  BUN 18  --   --  12  < > 72* 77* 66*  CREATININE 0.80  --   --  0.85  < > 1.70* 1.88* 1.66*  CALCIUM 8.7*  --   --  8.9  < > 10.2 10.0 9.4  MG 2.1  --   --  1.8  --  2.0  --   --   PHOS  --  1.3* 2.2* 4.6  --   --   --   --   < > = values in this interval not displayed.  Recent Labs  04/15/16 2032 04/24/16 0700 04/28/16 1615  AST 14* 19 16  ALT 9* 13* 12*  ALKPHOS 84 67 50  BILITOT 0.4 0.3 0.3  PROT 6.9 5.7* 5.2*  ALBUMIN 3.6 3.0* 2.9*    Recent Labs  04/24/16 0700 04/28/16 1615 04/28/16 2140 04/29/16 0626 04/29/16 1839  WBC 10.0 11.6* 16.0* 13.5*  --   NEUTROABS 6.9* 8.0* 12.3*  --   --   HGB 9.0* 5.8* 5.7* 7.6* 8.5*  HCT 27.0* 18.3* 17.3* 23.0*  --   MCV 87.9 88.7 88.1 88.5  --   PLT 351 354 348 303  --    Lab Results  Component Value Date   TSH 3.109 04/28/2016   Lab Results  Component Value Date   HGBA1C 5.2 04/16/2016   Lab Results  Component Value Date   CHOL 106 04/28/2016   HDL 27 (L) 04/28/2016   LDLCALC 22 04/28/2016   TRIG 287 (H) 04/28/2016   CHOLHDL 3.9 04/28/2016    Significant Diagnostic Results in last 30 days:  Ct Head Wo Contrast  Result Date: 04/16/2016 CLINICAL DATA:  76 year old female with falls. EXAM: CT HEAD WITHOUT CONTRAST TECHNIQUE: Contiguous axial images were obtained from the base of the skull through the vertex without intravenous contrast. COMPARISON:  None. FINDINGS: Brain: There is mild age-related atrophy and chronic microvascular ischemic changes. There is no acute  intracranial hemorrhage. No mass  effect or midline shift noted. No extra-axial fluid collection. Vascular: No hyperdense vessel or unexpected calcification. Skull: Normal. Negative for fracture or focal lesion. Sinuses/Orbits: Minimal mucoperiosteal thickening of paranasal sinuses. No air-fluid levels. Mastoid air cells are clear. The globes are intact. Other: None IMPRESSION: No acute intracranial hemorrhage. Mild age-related atrophy and chronic microvascular ischemic changes. Electronically Signed   By: Anner Crete M.D.   On: 04/16/2016 00:13   Dg Chest Portable 1 View  Result Date: 04/28/2016 CLINICAL DATA:  Altered mental status and weakness EXAM: PORTABLE CHEST 1 VIEW COMPARISON:  Chest radiograph 04/10/2016 FINDINGS: The right hemidiaphragm remains elevated. There is bibasilar atelectasis. No focal airspace consolidation. No pneumothorax or sizable pleural effusion. Large hiatal hernia. IMPRESSION: Shallow lung inflation with persistent elevation of the right hemidiaphragm. No focal consolidation. Electronically Signed   By: Ulyses Jarred M.D.   On: 04/28/2016 21:41   Dg Chest Port 1 View  Result Date: 04/18/2016 CLINICAL DATA:  UTI and sepsis EXAM: PORTABLE CHEST 1 VIEW COMPARISON:  04/15/2016 FINDINGS: Elevation the right hemidiaphragm is again seen. Cardiac shadow is stable. Hiatal hernia is again seen. No focal infiltrate or sizable effusion is seen. IMPRESSION: No acute abnormality noted. Electronically Signed   By: Inez Catalina M.D.   On: 04/18/2016 18:26   Dg Chest Port 1 View  Result Date: 04/15/2016 CLINICAL DATA:  Mental status changes EXAM: PORTABLE CHEST 1 VIEW COMPARISON:  01/15/2016 FINDINGS: 2017 hours. Stable marked asymmetric elevation right hemidiaphragm. Basilar atelectasis again noted. The cardiopericardial silhouette is within normal limits for size. Hiatal hernia evident. The visualized bony structures of the thorax are intact. Apparent right PICC line is evident with tip  in the region of the subclavian vein. IMPRESSION: Stable. Asymmetric elevation right hemidiaphragm with bibasilar atelectasis. Hiatal hernia. Electronically Signed   By: Misty Stanley M.D.   On: 04/15/2016 20:35    Assessment/Plan 1. Delirium  DC Haldol  DC Bactrim  DC Tamiflu  Labs  Repeat UA, C&S  2. Acute blood loss anemia  Send to Emergency Room for evaluation/ work up, transfusion  Family/ staff Communication:   Total Time:  Documentation:  Face to Face:  Family/Phone:   Labs/tests ordered: cbc, met c, tsh. B12, D, mag+, ammonia, ua, c&s  Medication list reviewed and assessed for continued appropriateness.  Vikki Ports, NP-C Geriatrics [facility] [phone], [place] [address] Cell Phone (Mon-Fri 8am-5pm):  [phone] On Call:  [phone] & follow prompts after 5pm & weekends Office Phone:  [phone] Office Fax:  [phone]

## 2016-05-01 LAB — BASIC METABOLIC PANEL
ANION GAP: 6 (ref 5–15)
BUN: 24 mg/dL — ABNORMAL HIGH (ref 6–20)
CALCIUM: 9.5 mg/dL (ref 8.9–10.3)
CO2: 27 mmol/L (ref 22–32)
Chloride: 106 mmol/L (ref 101–111)
Creatinine, Ser: 0.97 mg/dL (ref 0.44–1.00)
GFR, EST NON AFRICAN AMERICAN: 57 mL/min — AB (ref >60)
Glucose, Bld: 95 mg/dL (ref 65–99)
Potassium: 4 mmol/L (ref 3.5–5.1)
Sodium: 139 mmol/L (ref 135–145)

## 2016-05-01 LAB — CBC
HEMATOCRIT: 26.6 % — AB (ref 35.0–47.0)
Hemoglobin: 9.1 g/dL — ABNORMAL LOW (ref 12.0–16.0)
MCH: 30.5 pg (ref 26.0–34.0)
MCHC: 34.1 g/dL (ref 32.0–36.0)
MCV: 89.4 fL (ref 80.0–100.0)
PLATELETS: 309 10*3/uL (ref 150–440)
RBC: 2.98 MIL/uL — AB (ref 3.80–5.20)
RDW: 14.7 % — AB (ref 11.5–14.5)
WBC: 10.5 10*3/uL (ref 3.6–11.0)

## 2016-05-01 LAB — URINE CULTURE

## 2016-05-01 LAB — GLUCOSE, CAPILLARY
GLUCOSE-CAPILLARY: 93 mg/dL (ref 65–99)
GLUCOSE-CAPILLARY: 193 mg/dL — AB (ref 65–99)

## 2016-05-01 LAB — HEMOGLOBIN
HEMOGLOBIN: 8.6 g/dL — AB (ref 12.0–16.0)
Hemoglobin: 8.9 g/dL — ABNORMAL LOW (ref 12.0–16.0)

## 2016-05-01 MED ORDER — LEVOTHYROXINE SODIUM 50 MCG PO TABS: 50.0000 ug | ORAL_TABLET | Freq: Every day | ORAL | Status: DC

## 2016-05-01 MED ORDER — HALOPERIDOL 1 MG PO TABS
0.5000 mg | ORAL_TABLET | Freq: Two times a day (BID) | ORAL | Status: DC | PRN
  Administered 2016-05-01: 10:00:00 0.5 mg via ORAL
  Filled 2016-05-01: qty 1

## 2016-05-01 MED ORDER — CITALOPRAM HYDROBROMIDE 20 MG PO TABS: 20.0000 mg | ORAL_TABLET | ORAL | 1 refills | Status: DC

## 2016-05-01 MED ORDER — LINEZOLID 600 MG PO TABS: 600.0000 mg | ORAL_TABLET | Freq: Two times a day (BID) | ORAL | Status: DC

## 2016-05-01 MED ORDER — SODIUM CHLORIDE 0.9% FLUSH
3.0000 mL | Freq: Two times a day (BID) | INTRAVENOUS | Status: DC
Start: 2016-05-01 — End: 2016-05-01
  Administered 2016-05-01: 07:00:00 3 mL via INTRAVENOUS

## 2016-05-01 MED ORDER — CITALOPRAM HYDROBROMIDE 20 MG PO TABS: 20.0000 mg | ORAL_TABLET | ORAL | 1 refills | Status: AC

## 2016-05-01 MED ORDER — LINEZOLID 600 MG PO TABS: 600.0000 mg | ORAL_TABLET | Freq: Two times a day (BID) | ORAL | 0 refills | Status: DC

## 2016-05-01 MED ORDER — HALOPERIDOL 0.5 MG PO TABS: 0.5000 mg | ORAL_TABLET | Freq: Two times a day (BID) | ORAL | 2 refills | Status: AC | PRN

## 2016-05-01 NOTE — Progress Notes (Signed)
[employer] [facility] [place] at [facility]   PATIENT NAME: Leslie Weber    MR#:  [id number]  DATE OF BIRTH:  09-10-1940  SUBJECTIVE:  CHIEF COMPLAINT:   Chief Complaint  Patient presents with  . Abnormal Lab   - more alert today and tearful speaking about her recent sick months - worried that daughter might be angry with her decisions  REVIEW OF SYSTEMS:  Review of Systems  Constitutional: Negative for chills, fever and malaise/fatigue.  HENT: Negative for ear discharge, hearing loss and nosebleeds.   Eyes: Negative for blurred vision.  Respiratory: Negative for cough, shortness of breath and wheezing.   Cardiovascular: Positive for leg swelling. Negative for chest pain and palpitations.  Gastrointestinal: Negative for abdominal pain, constipation, diarrhea, nausea and vomiting.  Genitourinary: Negative for dysuria.  Musculoskeletal: Positive for myalgias. Negative for joint pain.  Neurological: Positive for weakness. Negative for dizziness, speech change, focal weakness, seizures and headaches.  Psychiatric/Behavioral: Negative for depression.    DRUG ALLERGIES:   Allergies  Allergen Reactions  . Oxycodone Other (See Comments)    "stopped breathing"    VITALS:  Blood pressure (!) 156/79, pulse 98, temperature 98 F (36.7 C), temperature source Oral, resp. rate 18, height 5\' 7"  (1.702 m), weight 94.4 kg (208 lb 1.6 oz), SpO2 96 %.  PHYSICAL EXAMINATION:  Physical Exam  GENERAL:  76 y.o.-year-old appearing elderly patient lying in the bed with no acute distress.  EYES: Pupils equal, round, reactive to light and accommodation. No scleral icterus. Extraocular muscles intact. Pale conjunctiva HEENT: Head atraumatic, normocephalic. Oropharynx and nasopharynx clear.  NECK:  Supple, no jugular venous distention. No thyroid enlargement, no tenderness.  LUNGS: Normal breath sounds bilaterally, no wheezing, rales,rhonchi or crepitation. No use of accessory muscles  of respiration. Decreased bibasilar breath sounds CARDIOVASCULAR: S1, S2 normal. No rubs, or gallops. 3/6 systolic murmur present. ABDOMEN: Soft, nontender, nondistended. Bowel sounds present. No organomegaly or mass.  EXTREMITIES: Right knee in a brace, left ankle in a brace as well  NEUROLOGIC: Cranial nerves II through XII are intact. Moving upper extremities well, lower extremity movement limited due to braces. However more mobility in bed noted today/. Sensation intact. Gait not checked. Global weakness PSYCHIATRIC: The patient is alert and oriented x 3, more alert. Only intemittent confusion. SKIN: No obvious rash, lesion, or ulcer.    LABORATORY PANEL:   CBC  Recent Labs Lab 05/01/16 0516  WBC 10.5  HGB 9.1*  HCT 26.6*  PLT 309   ------------------------------------------------------------------------------------------------------------------  Chemistries   Recent Labs Lab 04/28/16 1615  05/01/16 0516  NA 137  < > 139  K 5.4*  < > 4.0  CL 102  < > 106  CO2 30  < > 27  GLUCOSE 103*  < > 95  BUN 72*  < > 24*  CREATININE 1.70*  < > 0.97  CALCIUM 10.2  < > 9.5  MG 2.0  --   --   AST 16  --   --   ALT 12*  --   --   ALKPHOS 50  --   --   BILITOT 0.3  --   --   < > = values in this interval not displayed. ------------------------------------------------------------------------------------------------------------------  Cardiac Enzymes No results for input(s): TROPONINI in the last 168 hours. ------------------------------------------------------------------------------------------------------------------  RADIOLOGY:  No results found.  EKG:   Orders placed or performed during the hospital encounter of 04/28/16  . EKG 12-Lead  . EKG 12-Lead  ASSESSMENT AND PLAN:   76 year old female with multiple medical problems including recurrent falls requiring right knee surgeries, left ankle fracture in an immobilizer in status post surgery, diabetes, hypertension,  neuromuscular disorder, neuropathy, coronary artery disease presents to hospital from rehabilitation secondary to altered mental status and was noted to be anemic.  #1 acute on chronic anemia-secondary to slow GI bleeding. Has been having melanotic stools. -Baseline hemoglobin around 9, admission hemoglobin was 6. -Received total of 3 units of packed RBC transfusion this admission and hemoglobin today is at 9.0. -Continue Protonix. GI consult appreciated. -s/p EGD showing hiatal hernia and duodenitis. Changed protonix to oral bid -Hold NSAIDS- celebrex at discharge  #2 diabetes mellitus-since intake has been low, only on sliding scale insulin. Monitor sugars.  #3 acute renal failure on admission-Bactrim discontinued. Gentle hydration and improving creatinine. -Urine analysis on admission did not show any bacteria at this time  #4 hypertension-not on any medications. Monitor  #5 hypothyroidism-on Synthroid  #6 depression and dementia-continue home medications. Outpatient psychiatry referral Haldol at bedtime and during daytime PRN  #7 DVT prophylaxis-Ted's and SCDs. No heparin products due to GI bleed   Discharge to rehab likely today.     All the records are reviewed and case discussed with Care Management/Social Workerr. Management plans discussed with the patient, family and they are in agreement.  CODE STATUS: Full Code  TOTAL TIME TAKING CARE OF THIS PATIENT: 74 minutes.   POSSIBLE D/C toDAY, DEPENDING ON CLINICAL CONDITION.   Enid BaasKALISETTI,Lynnet Hefley M.D on 05/01/2016 at 9:12 AM  Between 7am to 6pm - Pager - [phone]  After 6pm go to [email or web address] - Social research officer, governmentpassword EPAS [facility]  [facility]  Office  [phone]  CC: Primary care physician; Rafael BihariWALKER III, JOHN B, MD

## 2016-05-01 NOTE — Discharge Instructions (Signed)
Gastrointestinal Bleeding °Gastrointestinal (GI) bleeding is bleeding somewhere along the digestive tract, between the mouth and anus. This can be caused by various problems. The severity of these problems can range from mild to serious or even life-threatening. If you have GI bleeding, you may find blood in your stools (feces), you may have black stools, or you may vomit blood. If there is a lot of bleeding, you may need to stay in the hospital. °What are the causes? °This condition may be caused by: °· Esophagitis. This is inflammation, irritation, or swelling of the esophagus. °· Hemorrhoids. These are swollen veins in the rectum. °· Anal fissures. These are areas of painful tearing that are often caused by passing hard stool. °· Diverticulosis. These are pouches that form on the colon over time, with age, and may bleed a lot. °· Diverticulitis. This is inflammation in areas with diverticulosis. It can cause pain, fever, and bloody stools, although bleeding may be mild. °· Polyps and cancer. Colon cancer often starts out as precancerous polyps. °· Gastritis and ulcers. With these, bleeding may come from the upper GI tract, near the stomach. °What are the signs or symptoms? °Symptoms of this condition may include: °· Bright red blood in your vomit, or vomit that looks like coffee grounds. °· Bloody, black, or tarry stools. °¨ Bleeding from the lower GI tract will usually cause red or maroon blood in the stools. °¨ Bleeding from the upper GI tract may cause black, tarry, often bad-smelling stools. °¨ In certain cases, if the bleeding is fast enough, the stools may be red. °· Pain or cramping in the abdomen. °How is this diagnosed? °This condition may be diagnosed based on: °· Medical history and physical exam. °· Various tests, such as: °¨ Blood tests. °¨ X-rays and other imaging tests. °¨ Esophagogastroduodenoscopy (EGD). In this test, a flexible, lighted tube is used to look at your esophagus, stomach, and small  intestine. °¨ Colonoscopy. In this test, a flexible, lighted tube is used to look at your colon. °How is this treated? °Treatment for this condition depends on the cause of the bleeding. For example: °· For bleeding from the esophagus, stomach, small intestine, or colon, the health care provider doing your EGD or colonoscopy may be able to stop the bleeding as part of the procedure. °· Inflammation or infection of the colon can be treated with medicines. °· Certain rectal problems can be treated with creams, suppositories, or warm baths. °· Surgery is sometimes needed. °· Blood transfusions are sometimes needed if a lot of blood has been lost. °If bleeding is slow, you may be allowed to go home. If there is a lot of bleeding, you will need to stay in the hospital for observation. °Follow these instructions at home: °· Take over-the-counter and prescription medicines only as told by your health care provider. °· Eat foods that are high in fiber. This will help to keep your stools soft. These foods include whole grains, legumes, fruits, and vegetables. Eating 1-3 prunes each day works well for many people. °· Drink enough fluid to keep your urine clear or pale yellow. °· Keep all follow-up visits as told by your health care provider. This is important. °Contact a health care provider if: °· Your symptoms do not improve. °Get help right away if: °· Your bleeding increases. °· You feel light-headed or you faint. °· You feel weak. °· You have severe cramps in your back or abdomen. °· You pass large blood clots in your stool. °· Your symptoms are   getting worse. °This information is not intended to replace advice given to you by your health care provider. Make sure you discuss any questions you have with your health care provider. °Document Released: 03/05/2000 Document Revised: 08/06/2015 Document Reviewed: 08/26/2014 °Elsevier Interactive Patient Education © 2017 Elsevier Inc. ° °

## 2016-05-01 NOTE — Progress Notes (Signed)
[facility]. EMS called for routine transport. Familiy and pt updated. Pt calmer and dozing at intervals.

## 2016-05-01 NOTE — Progress Notes (Signed)
Family requesting medicare contact information with computer address and toll free phone number given. Pt/family advised of reason to delay zyvox due to contraindications with her celexa and nortriptyline. TC with Clydie BraunKaren, SW with no further interventions available. TC with Dr. Budd PalmerKalisettia and advised family has question/concerns with [facility].

## 2016-05-01 NOTE — Progress Notes (Signed)
Pt alert/confused-wants us to call her parents to come take her home; wants to get OOB.  Continues to pull 02 off with pulse ox RA in normal limits. Dgt requested we reapply-done. No sign/symptom active bleeding. Tolerated soft diet. VSS. Dgt and family member in. Oral and written AVS instructions reviewed with pt/dgt/husband and placed in transfer packet. Haldol x 1 given with minimal effect.

## 2016-05-01 NOTE — Clinical Social Work Note (Signed)
CSW received notification from attending of addition of oral antibiotic to dc med list. CSW sent updated dc summary to facility and alerted the admissions coordinator. CSW con't to follow for any additional dc needs.  Argentina PonderKaren Martha Norvel Wenker, MSW, Theresia MajorsLCSWA [phone]

## 2016-05-01 NOTE — Progress Notes (Signed)
Husband and dgts now agreeable [facility].Pt has been napping/resting quietly with no co's. EMS here for transport with discharge packet given; dgt has copy of AVS. VSS. [facility].

## 2016-05-01 NOTE — Clinical Social Work Note (Signed)
CSW spoke with patient's daughter Alvino Chapelllen about the patient dc today to [facility]. The patient's daughter agrees with the plan, but has concern about continued rehospitalization and hemoglobin level. CSW assured the patient's daughter that the SNF is ready for her return per the admissions coordinator, and that the hemoglobin is stable per labs and attending report. The patient's daughter indicated that EMS will be the necessary transport due to the pressure wound and the patient's dementia status. Facility is aware, and all necessary paperwork is sent. CSW will con't to follow pending additional dc needs.  Argentina PonderKaren Martha Peaches Vanoverbeke, MSW, Theresia MajorsLCSWA [phone]

## 2016-05-01 NOTE — Discharge Summary (Addendum)
[facility] [place] [facility]   PATIENT NAME: Leslie Weber    MR#:  [id number]  DATE OF BIRTH:  Aug 01, 1940  DATE OF ADMISSION:  04/28/2016   ADMITTING PHYSICIAN: Oralia Manis, MD  DATE OF DISCHARGE: 02/10//2018  PRIMARY CARE PHYSICIAN: Rafael Bihari, MD   ADMISSION DIAGNOSIS:   Delirium [R41.0] AKI (acute kidney injury) (HCC) [N17.9] UGIB (upper gastrointestinal bleed) [K92.2]  DISCHARGE DIAGNOSIS:   Principal Problem:   GI bleed Active Problems:   Hypothyroidism   Hypertension   HLD (hyperlipidemia)   Coronary artery disease   Type 2 diabetes with nephropathy (HCC)   Major neurocognitive disorder, due to brain atrophy and cerebrovascular disease   Pressure injury of skin   SECONDARY DIAGNOSIS:   Past Medical History:  Diagnosis Date  . Arthritis   . Coronary artery disease    a. 11/2014 CT chest w/ coronary atherosclerosis.  . Depression   . Diabetes mellitus without complication (HCC)   . Fever due to malaria    76 years old  . Fibromyalgia   . GERD (gastroesophageal reflux disease)   . History of hiatal hernia   . Hypertension   . Hypothyroidism   . Neuromuscular disorder (HCC)    Bells palsy left side of face  . Neuropathy of both feet   . Osteoarthritis    a. s/p R TKA 12/2015.  Marland Kitchen Pneumonia 1997   history of walking pneumonia  . Sleep apnea    not yet had sleep study    HOSPITAL COURSE:   76 year old female with multiple medical problems including recurrent falls requiring right knee surgeries, left ankle fracture in an immobilizer in status post surgery, diabetes, hypertension, neuromuscular disorder, neuropathy, coronary artery disease presents to hospital from rehabilitation secondary to altered mental status and was noted to be anemic.  #1 acute on chronic anemia-secondary to slow GI bleeding. Has been having melanotic stools. -Baseline hemoglobin around 9, admission hemoglobin was 6. -Received total of 3 units  of packed RBC transfusion this admission and hemoglobin today is at 9.0. -Continue Protonix. GI consult appreciated. -s/p EGD showing hiatal hernia and duodenitis. Changed protonix to oral bid -Hold NSAIDS- celebrex at discharge  #2 diabetes mellitus-since intake has been low, only on sliding scale insulin. Monitor sugars.  #3 acute renal failure on admission-Bactrim discontinued. Gentle hydration and improving creatinine. -Urine analysis on admission did not show any bacteria at this time  #4 hypertension-not on any medications. Monitor  #5 hypothyroidism-on Synthroid  #6 depression and dementia-continue home medications. Outpatient psychiatry referral Haldol at bedtime and during daytime PRN  #7 DVT prophylaxis-Ted's and SCDs. No heparin products due to GI bleed  #8 VRE UTI- colonization, only sensitive to zyvox- but apparently it reacts with celexa, nortryptiline and causes SSRI It cant be started until 14 days after stopping nortriptyline. So will hold off on treating as patient is not exhibiting any signs of uti,infection. And she hasnt been treated while here. Also daughter worried that if celexa and nortriptyline stopped- patient gets very agitated.   Discharge to rehab likely today.  DISCHARGE CONDITIONS:   Guarded  CONSULTS OBTAINED:   Treatment Team:  Midge Minium, MD Donato Heinz, MD  DRUG ALLERGIES:   Allergies  Allergen Reactions  . Oxycodone Other (See Comments)    "stopped breathing"   DISCHARGE MEDICATIONS:   Allergies as of 05/01/2016      Reactions   Oxycodone Other (See Comments)   "stopped breathing"  Medication List    STOP taking these medications   celecoxib 200 MG capsule Commonly known as:  CELEBREX   metoprolol tartrate 25 MG tablet Commonly known as:  LOPRESSOR   oseltamivir 75 MG capsule Commonly known as:  TAMIFLU   sulfamethoxazole-trimethoprim 800-160 MG tablet Commonly known as:  BACTRIM DS,SEPTRA DS     VICTOZA 18 MG/3ML Sopn Generic drug:  liraglutide     TAKE these medications   acetaminophen 325 MG tablet Commonly known as:  TYLENOL Take 2 tablets (650 mg total) by mouth every 6 (six) hours as needed for mild pain (or Fever >/= 101). What changed:  medication strength  how much to take  when to take this  reasons to take this   atorvastatin 40 MG tablet Commonly known as:  LIPITOR Take 40 mg by mouth every morning.   CALCIUM 600+D 600-800 MG-UNIT Tabs Generic drug:  Calcium Carb-Cholecalciferol Take 1 Dose by mouth 2 (two) times daily.   CENTRUM WOMEN PO Take 1 tablet by mouth every morning.   citalopram 20 MG tablet Commonly known as:  CELEXA Take 1 tablet (20 mg total) by mouth every morning.   ferrous sulfate 325 (65 FE) MG tablet Take 325 mg by mouth daily with breakfast.   gabapentin 600 MG tablet Commonly known as:  NEURONTIN Take 0.5 tablets (300 mg total) by mouth 2 (two) times daily.   haloperidol 0.5 MG tablet Commonly known as:  HALDOL Take 1 tablet (0.5 mg total) by mouth at bedtime. What changed:  Another medication with the same name was added. Make sure you understand how and when to take each.   haloperidol 0.5 MG tablet Commonly known as:  HALDOL Take 1 tablet (0.5 mg total) by mouth 2 (two) times daily as needed for agitation. What changed:  You were already taking a medication with the same name, and this prescription was added. Make sure you understand how and when to take each. Notes to patient:  Given dose at 0937   levothyroxine 50 MCG tablet Commonly known as:  SYNTHROID, LEVOTHROID Take 50 mcg by mouth daily before breakfast.   LORazepam 0.5 MG tablet Commonly known as:  ATIVAN Take 1 tablet (0.5 mg total) by mouth at bedtime as needed for anxiety.   nortriptyline 75 MG capsule Commonly known as:  PAMELOR Take 75 mg by mouth at bedtime.   pantoprazole 40 MG tablet Commonly known as:  PROTONIX Take 1 tablet (40 mg total)  by mouth 2 (two) times daily. What changed:  when to take this   traMADol 50 MG tablet Commonly known as:  ULTRAM Take 1 tablet (50 mg total) by mouth every 6 (six) hours as needed for moderate pain.        DISCHARGE INSTRUCTIONS:   1. PCP f/u in 1 week 2. Ortho f/u as prior scheduled 3. Psych f/u with Dr. Caryn Section in 2 weeks 4. Palliative care follow up  DIET:   Cardiac diet  ACTIVITY:   Activity as tolerated  OXYGEN:   Home Oxygen: Yes.    Oxygen Delivery: 2 liters/min via Patient connected to nasal cannula oxygen  DISCHARGE LOCATION:   nursing home   If you experience worsening of your admission symptoms, develop shortness of breath, life threatening emergency, suicidal or homicidal thoughts you must seek medical attention immediately by calling 911 or calling your MD immediately  if symptoms less severe.  You Must read complete instructions/literature along with all the possible adverse reactions/side effects for  all the Medicines you take and that have been prescribed to you. Take any new Medicines after you have completely understood and accpet all the possible adverse reactions/side effects.   Please note  You were cared for by a hospitalist during your hospital stay. If you have any questions about your discharge medications or the care you received while you were in the hospital after you are discharged, you can call the unit and asked to speak with the hospitalist on call if the hospitalist that took care of you is not available. Once you are discharged, your primary care physician will handle any further medical issues. Please note that NO REFILLS for any discharge medications will be authorized once you are discharged, as it is imperative that you return to your primary care physician (or establish a relationship with a primary care physician if you do not have one) for your aftercare needs so that they can reassess your need for medications and monitor your  lab values.    On the day of Discharge:  VITAL SIGNS:   Blood pressure (!) 156/79, pulse 98, temperature 98 F (36.7 C), temperature source Oral, resp. rate 18, height 5\' 7"  (1.702 m), weight 94.4 kg (208 lb 1.6 oz), SpO2 96 %.  PHYSICAL EXAMINATION:    GENERAL:  76 y.o.-year-old appearing elderly patient lying in the bed with no acute distress.  EYES: Pupils equal, round, reactive to light and accommodation. No scleral icterus. Extraocular muscles intact. Pale conjunctiva HEENT: Head atraumatic, normocephalic. Oropharynx and nasopharynx clear.  NECK:  Supple, no jugular venous distention. No thyroid enlargement, no tenderness.  LUNGS: Normal breath sounds bilaterally, no wheezing, rales,rhonchi or crepitation. No use of accessory muscles of respiration. Decreased bibasilar breath sounds CARDIOVASCULAR: S1, S2 normal. No rubs, or gallops. 3/6 systolic murmur present. ABDOMEN: Soft, nontender, nondistended. Bowel sounds present. No organomegaly or mass.  EXTREMITIES: Right knee in a brace, left ankle in a brace as well  NEUROLOGIC: Cranial nerves II through XII are intact. Moving upper extremities well, lower extremity movement limited due to braces. However more mobility in bed noted today/. Sensation intact. Gait not checked. Global weakness PSYCHIATRIC: The patient is alert and oriented x 3, more alert. Only intemittent confusion. SKIN: No obvious rash, lesion, or ulcer.   DATA REVIEW:   CBC  Recent Labs Lab 05/01/16 0516  WBC 10.5  HGB 9.1*  HCT 26.6*  PLT 309    Chemistries   Recent Labs Lab 04/28/16 1615  05/01/16 0516  NA 137  < > 139  K 5.4*  < > 4.0  CL 102  < > 106  CO2 30  < > 27  GLUCOSE 103*  < > 95  BUN 72*  < > 24*  CREATININE 1.70*  < > 0.97  CALCIUM 10.2  < > 9.5  MG 2.0  --   --   AST 16  --   --   ALT 12*  --   --   ALKPHOS 50  --   --   BILITOT 0.3  --   --   < > = values in this interval not displayed.   Microbiology Results  Results  for orders placed or performed during the hospital encounter of 04/28/16  Urine culture     Status: Abnormal   Collection Time: 04/28/16  9:53 PM  Result Value Ref Range Status   Specimen Description URINE, RANDOM  Final   Special Requests NONE  Final   Culture (A)  Final    >=100,000 COLONIES/mL VANCOMYCIN RESISTANT ENTEROCOCCUS   Report Status 05/01/2016 FINAL  Final   Organism ID, Bacteria VANCOMYCIN RESISTANT ENTEROCOCCUS (A)  Final      Susceptibility   Vancomycin resistant enterococcus - MIC*    AMPICILLIN >=32 RESISTANT Resistant     LEVOFLOXACIN >=8 RESISTANT Resistant     NITROFURANTOIN 128 RESISTANT Resistant     VANCOMYCIN >=32 RESISTANT Resistant     LINEZOLID 2 SENSITIVE Sensitive     * >=100,000 COLONIES/mL VANCOMYCIN RESISTANT ENTEROCOCCUS  MRSA PCR Screening     Status: Abnormal   Collection Time: 04/29/16  1:30 AM  Result Value Ref Range Status   MRSA by PCR (A) NEGATIVE Final    INVALID, UNABLE TO DETERMINE THE PRESENCE OF TARGET DNA DUE TO SPECIMEN INTEGRITY. RECOLLECTION REQUESTED.    Comment:        The GeneXpert MRSA Assay (FDA approved for NASAL specimens only), is one component of a comprehensive MRSA colonization surveillance program. It is not intended to diagnose MRSA infection nor to guide or monitor treatment for MRSA infections. REQUEST FOR RECOLLECT AND REORDER CALLED TO JACKIE PAGE AT 0636 ON 04/29/16 [facility].   MRSA PCR Screening     Status: None   Collection Time: 04/29/16  6:40 AM  Result Value Ref Range Status   MRSA by PCR NEGATIVE NEGATIVE Final    Comment:        The GeneXpert MRSA Assay (FDA approved for NASAL specimens only), is one component of a comprehensive MRSA colonization surveillance program. It is not intended to diagnose MRSA infection nor to guide or monitor treatment for MRSA infections.     RADIOLOGY:  No results found.   Management plans discussed with the patient, family and they are in agreement.  CODE  STATUS:     Code Status Orders        Start     Ordered   04/29/16 0132  Full code  Continuous     04/29/16 0131    Code Status History    Date Active Date Inactive Code Status Order ID Comments User Context   04/16/2016  2:02 AM 04/21/2016 11:57 PM Full Code [id number]  Oralia Manisavid Willis, MD Inpatient   03/19/2016  3:10 PM 03/22/2016  5:15 PM Full Code [id number]  Donato HeinzJames P Hooten, MD Inpatient   01/15/2016  8:06 PM 01/16/2016  6:23 PM Full Code [id number]  Enid Baasadhika Lyrica Mcclarty, MD ED   01/12/2016  3:43 PM 01/14/2016 10:03 PM Full Code [id number]  Donato HeinzJames P Hooten, MD Inpatient    Advance Directive Documentation   Flowsheet Row Most Recent Value  Type of Advance Directive  Healthcare Power of Attorney, Living will  Pre-existing out of facility DNR order (yellow form or pink MOST form)  Pink MOST form placed in chart (order not valid for inpatient use)  "MOST" Form in Place?  No data      TOTAL TIME TAKING CARE OF THIS PATIENT: 38 minutes.    Masiah Woody M.D on 05/01/2016 at 9:16 AM  Between 7am to 6pm - Pager - [phone]  After 6pm go to [email or web address] - Social research officer, [email or web address] [email or web address]  [facility] [place] [facility]  Office  [phone]  CC: Primary care physician; Rafael BihariWALKER III, JOHN B, MD   Note: This dictation was prepared with Dragon dictation along with smaller phrase technology. Any transcriptional errors that result from this process are unintentional.

## 2016-05-03 ENCOUNTER — Encounter: Payer: Self-pay | Admitting: Gastroenterology

## 2016-05-03 DIAGNOSIS — R5381 Other malaise: Secondary | ICD-10-CM | POA: Diagnosis not present

## 2016-05-03 DIAGNOSIS — D62 Acute posthemorrhagic anemia: Secondary | ICD-10-CM | POA: Diagnosis not present

## 2016-05-03 DIAGNOSIS — R410 Disorientation, unspecified: Secondary | ICD-10-CM | POA: Diagnosis not present

## 2016-05-03 LAB — GLUCOSE, CAPILLARY
GLUCOSE-CAPILLARY: 136 mg/dL — AB (ref 65–99)
GLUCOSE-CAPILLARY: 151 mg/dL — AB (ref 65–99)
Glucose-Capillary: 125 mg/dL — ABNORMAL HIGH (ref 65–99)
Glucose-Capillary: 179 mg/dL — ABNORMAL HIGH (ref 65–99)
Glucose-Capillary: 170 mg/dL — ABNORMAL HIGH (ref 65–99)
Glucose-Capillary: 134 mg/dL — ABNORMAL HIGH (ref 65–99)

## 2016-05-04 DIAGNOSIS — R5381 Other malaise: Secondary | ICD-10-CM | POA: Diagnosis not present

## 2016-05-04 LAB — GLUCOSE, CAPILLARY
Glucose-Capillary: 151 mg/dL — ABNORMAL HIGH (ref 65–99)
Glucose-Capillary: 100 mg/dL — ABNORMAL HIGH (ref 65–99)

## 2016-05-04 NOTE — Anesthesia Postprocedure Evaluation (Signed)
Anesthesia Post Note  Patient: Rylen I Pocius  Procedure(s) Performed: Procedure(s) (LRB): ESOPHAGOGASTRODUODENOSCOPY (EGD) WITH PROPOFOL (N/A)  Patient location during evaluation: PACU Anesthesia Type: General Level of consciousness: awake Pain management: pain level controlled Vital Signs Assessment: post-procedure vital signs reviewed and stable Respiratory status: spontaneous breathing Cardiovascular status: stable Anesthetic complications: no     Last Vitals:  Vitals:   05/01/16 0722 05/01/16 1553  BP:  140/68  Pulse: 98 92  Resp:  18  Temp:  37.1 C    Last Pain:  Vitals:   05/01/16 1553  TempSrc: Oral  PainSc:                  VAN STAVEREN,Jeriko Kowalke

## 2016-05-05 ENCOUNTER — Non-Acute Institutional Stay (SKILLED_NURSING_FACILITY): Payer: Medicare Other | Admitting: Gerontology

## 2016-05-05 DIAGNOSIS — N390 Urinary tract infection, site not specified: Secondary | ICD-10-CM

## 2016-05-05 DIAGNOSIS — D62 Acute posthemorrhagic anemia: Secondary | ICD-10-CM | POA: Diagnosis not present

## 2016-05-05 DIAGNOSIS — R410 Disorientation, unspecified: Secondary | ICD-10-CM

## 2016-05-05 LAB — GLUCOSE, CAPILLARY
GLUCOSE-CAPILLARY: 161 mg/dL — AB (ref 65–99)
Glucose-Capillary: 104 mg/dL — ABNORMAL HIGH (ref 65–99)

## 2016-05-05 NOTE — Progress Notes (Signed)
Location:      Place of Service:  SNF (31) Provider:  Toni Arthurs, NP-C  Madelyn Brunner, MD  Patient Care Team: Madelyn Brunner, MD as PCP - General (Internal Medicine)  Extended Emergency Contact Information Primary Emergency Contact: [place] Phone: [phone] Relation: Daughter Secondary Emergency Contact: Claud Kelp Address: [place], [place] [address] [place] Phone: [phone] Mobile Phone: [phone] Relation: Spouse  Code Status:  full Goals of care: Advanced Directive information Advanced Directives 04/30/2016  Does Patient Have a Medical Advance Directive? Yes  Type of Advance Directive -  Does patient want to make changes to medical advance directive? -  Copy of [place] in Chart? -  Would patient like information on creating a medical advance directive? -  Pre-existing out of facility DNR order (yellow form or pink MOST form) -     Chief Complaint  Patient presents with  . Follow-up    HPI:  Pt is a 76 y.o. female seen today for a follow up visit for on-going delirium/AMS. Pt was admitted to the facility for rehab following an extended recovery s/p Left TKA. Pt had been at another rehab facility, got a UTI, went into sepsis, pt was intubated and on a vent in the hospital. Once stable, pt was then admitted for continuation of rehab services. Since admit, pt had been confused, oriented to person/self only. Unable to fully participate in therapy. Family is rightfully concerned the pt does not appear to be getting better. At the time (one week ago), she was still receiving po antibiotics for the presumed septic uti. Pt was lethargic, but arousable. Though easily falls back asleep in mid-conversation. Unable to focus. Unable to follow simple commands. Able to state her name and birthdate, but this appears to be the extent of her orientation. Pt is very  pale/ very pale mucus membranes. Mucus membranes were tachy. Pt did report that she did not feel well, but unable to elaborate. Unable to confirm/deny pain. Denies shortness of breath, denies n/v/d/f/c/cp/ha/abd pain. Pt appears very weak. No visible s/s of a cva. Speech very soft and difficult to understand. After obtaining labs/ results, she was found to be severely anemic. Pt was sent to the ED for further work up, blood transfusion. Upon discharge back to the facility, she was found to have another UTI. This time it was VRE. Hospitalist was wanting to initiate Zyvox after pt had been off her amitryptyline for 2 weeks. Daughter was very concerned about this as she has been on this med for many years. Decision was made to begin [facility] and then recheck urine at completion of course. Today, pt is alert, oriented x 4. Pleasant mood, joking. Pt reports she is bored because she on contact isolation and having to stay in her room. Mucus membranes mist and pink. Denies chest pain, shortness of breath. Continues with generalized weakness and fatigue. C/o tremors in the hands at rest, worse with transfers. Pt reports she has had this before the surgeries, but is now worsening. VSS. No other complaints.    Past Medical History:  Diagnosis Date  . Arthritis   . Coronary artery disease    a. 11/2014 CT chest w/ coronary atherosclerosis.  . Depression   . Diabetes mellitus without complication (Normal)   . Fever due to malaria    76 years  old  . Fibromyalgia   . GERD (gastroesophageal reflux disease)   . History of hiatal hernia   . Hypertension   . Hypothyroidism   . Neuromuscular disorder ([place])    Bells palsy left side of face  . Neuropathy of both feet   . Osteoarthritis    a. s/p R TKA 12/2015.  Marland Kitchen Pneumonia 1997   history of walking pneumonia  . Sleep apnea    not yet had sleep study   Past Surgical History:  Procedure Laterality Date  . ABDOMINAL HYSTERECTOMY    . APPENDECTOMY    . BACK SURGERY     . BLADDER SUSPENSION  2002  . CHOLECYSTECTOMY    . ESOPHAGOGASTRODUODENOSCOPY (EGD) WITH PROPOFOL N/A 04/30/2016   Procedure: ESOPHAGOGASTRODUODENOSCOPY (EGD) WITH PROPOFOL;  Surgeon: Lucilla Lame, MD;  Location: [facility] END[facility];  Service: Endoscopy;  Laterality: N/A;  . KNEE ARTHROPLASTY Right 01/12/2016   Procedure: COMPUTER ASSISTED TOTAL KNEE ARTHROPLASTY;  Surgeon: Dereck Leep, MD;  Location: [facility] ORS;  Service: Orthopedics;  Laterality: Right;  . ORIF ANKLE FRACTURE Left 03/19/2016   Procedure: OPEN REDUCTION INTERNAL FIXATION (ORIF) ANKLE FRACTURE;  Surgeon: Dereck Leep, MD;  Location: [facility] ORS;  Service: Orthopedics;  Laterality: Left;  . ORIF PATELLA Right 03/19/2016   Procedure: OPEN REDUCTION INTERNAL (ORIF) FIXATION PATELLA;  Surgeon: Dereck Leep, MD;  Location: [facility] ORS;  Service: Orthopedics;  Laterality: Right;  . TONSILLECTOMY      Allergies  Allergen Reactions  . Oxycodone Other (See Comments)    "stopped breathing"    Allergies as of 05/05/2016      Reactions   Oxycodone Other (See Comments)   "stopped breathing"      Medication List       Accurate as of 05/05/16  3:47 PM. Always use your most recent med list.          acetaminophen 325 MG tablet Commonly known as:  TYLENOL Take 2 tablets (650 mg total) by mouth every 6 (six) hours as needed for mild pain (or Fever >/= 101).   atorvastatin 40 MG tablet Commonly known as:  LIPITOR Take 40 mg by mouth every morning.   CALCIUM 600+D 600-800 MG-UNIT Tabs Generic drug:  Calcium Carb-Cholecalciferol Take 1 Dose by mouth 2 (two) times daily.   CENTRUM WOMEN PO Take 1 tablet by mouth every morning.   citalopram 20 MG tablet Commonly known as:  CELEXA Take 1 tablet (20 mg total) by mouth every morning.   ferrous sulfate 325 (65 FE) MG tablet Take 325 mg by mouth daily with breakfast.   gabapentin 600 MG tablet Commonly known as:  NEURONTIN Take 0.5 tablets (300 mg total) by mouth 2 (two) times  daily.   haloperidol 0.5 MG tablet Commonly known as:  HALDOL Take 1 tablet (0.5 mg total) by mouth at bedtime.   haloperidol 0.5 MG tablet Commonly known as:  HALDOL Take 1 tablet (0.5 mg total) by mouth 2 (two) times daily as needed for agitation.   levothyroxine 50 MCG tablet Commonly known as:  SYNTHROID, LEVOTHROID Take 50 mcg by mouth daily before breakfast.   LORazepam 0.5 MG tablet Commonly known as:  ATIVAN Take 1 tablet (0.5 mg total) by mouth at bedtime as needed for anxiety.   nortriptyline 75 MG capsule Commonly known as:  PAMELOR Take 75 mg by mouth at bedtime.   pantoprazole 40 MG tablet Commonly known as:  PROTONIX Take 1 tablet (40 mg total) by mouth 2 (two)  times daily.   traMADol 50 MG tablet Commonly known as:  ULTRAM Take 1 tablet (50 mg total) by mouth every 6 (six) hours as needed for moderate pain.       Review of Systems  Unable to perform ROS: Mental status change  Constitutional: Positive for fatigue. Negative for activity change, appetite change, chills, diaphoresis and fever.  HENT: Negative for congestion, sneezing, sore throat, trouble swallowing and voice change.   Respiratory: Negative for apnea, cough, choking, chest tightness, shortness of breath and wheezing.   Cardiovascular: Negative for chest pain, palpitations and leg swelling.  Gastrointestinal: Negative for abdominal distention, abdominal pain, constipation, diarrhea and nausea.  Genitourinary: Negative for difficulty urinating, dysuria, frequency and urgency.  Musculoskeletal: Negative for back pain, gait problem and myalgias. Arthralgias: typical arthritis.  Skin: Negative for color change, pallor, rash and wound.  Neurological: Positive for weakness. Negative for tremors, syncope, facial asymmetry, speech difficulty and headaches.  Psychiatric/Behavioral: Negative for agitation, behavioral problems, confusion, decreased concentration and dysphoric mood.  All other systems  reviewed and are negative.   Immunization History  Administered Date(s) Administered  . Influenza,inj,Quad PF,36+ Mos 01/14/2016   Pertinent  Health Maintenance Due  Topic Date Due  . FOOT EXAM  09/05/1950  . OPHTHALMOLOGY EXAM  09/05/1950  . URINE MICROALBUMIN  09/05/1950  . COLONOSCOPY  09/05/1992  . DEXA SCAN  09/06/2007  . PNA vac Low Risk Adult (1 of 2 - PCV13) 09/06/2007  . HEMOGLOBIN A1C  10/14/2016  . MAMMOGRAM  12/31/2017  . INFLUENZA VACCINE  Completed   No flowsheet data found. Functional Status Survey:    Vitals:   05/04/16 2030  BP: 124/71  Pulse: 77  Resp: 14  Temp: 98.6 F (37 C)  SpO2: 98%   There is no height or weight on file to calculate BMI. Physical Exam  Constitutional: Vital signs are normal. She appears well-developed and well-nourished. She appears lethargic. She is sleeping. She is easily aroused. She has a sickly appearance. She does not appear ill. No distress.  HENT:  Head: Normocephalic and atraumatic.  Mouth/Throat: Uvula is midline and oropharynx is clear and moist. Mucous membranes are pale, dry (tachy) and not cyanotic.  Eyes: Conjunctivae, EOM and lids are normal. Pupils are equal, round, and reactive to light.  Neck: Trachea normal, normal range of motion and full passive range of motion without pain. Neck supple. No JVD present. No tracheal deviation, no edema and no erythema present. No thyromegaly present.  Cardiovascular: Normal rate, normal heart sounds and intact distal pulses.  An irregular rhythm present. Exam reveals no gallop, no distant heart sounds and no friction rub.   No murmur heard. Pulses:      Dorsalis pedis pulses are 1+ on the right side, and 1+ on the left side.  Pulmonary/Chest: Effort normal. No accessory muscle usage. No respiratory distress. She has decreased breath sounds in the right lower field and the left lower field. She has no wheezes. She has no rhonchi. She has no rales. She exhibits no tenderness.    Abdominal: Soft. Normal appearance and bowel sounds are normal. She exhibits no distension and no ascites. There is no tenderness.  Musculoskeletal: She exhibits no edema or tenderness.       Right knee: She exhibits decreased range of motion and swelling.  Expected osteoarthritis, stiffness. Bledsoe brace in place. Calves soft, supple. Negative Homans' sign  Neurological: She is easily aroused. She has normal strength. She appears lethargic.  Skin: Skin is  warm, dry and intact. No rash noted. She is not diaphoretic. No cyanosis or erythema. There is pallor. Nails show no clubbing.  Skin pale, cool  Psychiatric: Thought content normal. Her affect is blunt. Her speech is slurred. Cognition and memory are impaired. She expresses impulsivity. She exhibits abnormal recent memory and abnormal remote memory. She is inattentive.  Nursing note and vitals reviewed.   Labs reviewed:  Recent Labs  04/19/16 0458 04/20/16 1058 04/20/16 2028 04/21/16 0445  04/28/16 1615  04/29/16 0626 04/30/16 0106 05/01/16 0516  NA 142  --   --  138  < > 137  < > 137 140 139  K 3.5  --   --  3.7  < > 5.4*  < > 4.8 4.3 4.0  CL 112*  --   --  104  < > 102  < > 107 108 106  CO2 27  --   --  29  < > 30  < > 27 27 27   GLUCOSE 92  --   --  106*  < > 103*  < > 106* 79 95  BUN 18  --   --  12  < > 72*  < > 66* 43* 24*  CREATININE 0.80  --   --  0.85  < > 1.70*  < > 1.66* 1.24* 0.97  CALCIUM 8.7*  --   --  8.9  < > 10.2  < > 9.4 9.2 9.5  MG 2.1  --   --  1.8  --  2.0  --   --   --   --   PHOS  --  1.3* 2.2* 4.6  --   --   --   --   --   --   < > = values in this interval not displayed.  Recent Labs  04/15/16 2032 04/24/16 0700 04/28/16 1615  AST 14* 19 16  ALT 9* 13* 12*  ALKPHOS 84 67 50  BILITOT 0.4 0.3 0.3  PROT 6.9 5.7* 5.2*  ALBUMIN 3.6 3.0* 2.9*    Recent Labs  04/24/16 0700 04/28/16 1615 04/28/16 2140 04/29/16 0626  05/01/16 0051 05/01/16 0516 05/01/16 0907  WBC 10.0 11.6* 16.0* 13.5*  --    --  10.5  --   NEUTROABS 6.9* 8.0* 12.3*  --   --   --   --   --   HGB 9.0* 5.8* 5.7* 7.6*  < > 8.6* 9.1* 8.9*  HCT 27.0* 18.3* 17.3* 23.0*  --   --  26.6*  --   MCV 87.9 88.7 88.1 88.5  --   --  89.4  --   PLT 351 354 348 303  --   --  309  --   < > = values in this interval not displayed. Lab Results  Component Value Date   TSH 3.109 04/28/2016   Lab Results  Component Value Date   HGBA1C 5.2 04/16/2016   Lab Results  Component Value Date   CHOL 106 04/28/2016   HDL 27 (L) 04/28/2016   LDLCALC 22 04/28/2016   TRIG 287 (H) 04/28/2016   CHOLHDL 3.9 04/28/2016    Significant Diagnostic Results in last 30 days:  Ct Head Wo Contrast  Result Date: 04/16/2016 CLINICAL DATA:  76 year old female with falls. EXAM: CT HEAD WITHOUT CONTRAST TECHNIQUE: Contiguous axial images were obtained from the base of the skull through the vertex without intravenous contrast. COMPARISON:  None. FINDINGS: Brain: There is mild age-related atrophy and  chronic microvascular ischemic changes. There is no acute intracranial hemorrhage. No mass effect or midline shift noted. No extra-axial fluid collection. Vascular: No hyperdense vessel or unexpected calcification. Skull: Normal. Negative for fracture or focal lesion. Sinuses/Orbits: Minimal mucoperiosteal thickening of paranasal sinuses. No air-fluid levels. Mastoid air cells are clear. The globes are intact. Other: None IMPRESSION: No acute intracranial hemorrhage. Mild age-related atrophy and chronic microvascular ischemic changes. Electronically Signed   By: Anner Crete M.D.   On: 04/16/2016 00:13   Dg Chest Portable 1 View  Result Date: 04/28/2016 CLINICAL DATA:  Altered mental status and weakness EXAM: PORTABLE CHEST 1 VIEW COMPARISON:  Chest radiograph 04/10/2016 FINDINGS: The right hemidiaphragm remains elevated. There is bibasilar atelectasis. No focal airspace consolidation. No pneumothorax or sizable pleural effusion. Large hiatal hernia.  IMPRESSION: Shallow lung inflation with persistent elevation of the right hemidiaphragm. No focal consolidation. Electronically Signed   By: Ulyses Jarred M.D.   On: 04/28/2016 21:41   Dg Chest Port 1 View  Result Date: 04/18/2016 CLINICAL DATA:  UTI and sepsis EXAM: PORTABLE CHEST 1 VIEW COMPARISON:  04/15/2016 FINDINGS: Elevation the right hemidiaphragm is again seen. Cardiac shadow is stable. Hiatal hernia is again seen. No focal infiltrate or sizable effusion is seen. IMPRESSION: No acute abnormality noted. Electronically Signed   By: Inez Catalina M.D.   On: 04/18/2016 18:26   Dg Chest Port 1 View  Result Date: 04/15/2016 CLINICAL DATA:  Mental status changes EXAM: PORTABLE CHEST 1 VIEW COMPARISON:  01/15/2016 FINDINGS: 2017 hours. Stable marked asymmetric elevation right hemidiaphragm. Basilar atelectasis again noted. The cardiopericardial silhouette is within normal limits for size. Hiatal hernia evident. The visualized bony structures of the thorax are intact. Apparent right PICC line is evident with tip in the region of the subclavian vein. IMPRESSION: Stable. Asymmetric elevation right hemidiaphragm with bibasilar atelectasis. Hiatal hernia. Electronically Signed   By: Misty Stanley M.D.   On: 04/15/2016 20:35    Assessment/Plan 1. Delirium  SLP treating  Reminders throughout room to identify objects or instructions  Haldol 0.5 mg po Q HS  Haldol 0.5 mg po BID prn for agitation  2. Acute blood loss anemia  Twice weekly CBCs to monitor Hgb  3. UTI  VRE UTI  Continue Monurol 3 gram po EOD x 4 doses  Contact Precautions until culture negative  Re-check urine culture at completion of abt   Encourage po fluid intake  Family/ staff Communication:   Total Time:  Documentation:  Face to Face:  Family/Phone:   Labs/tests ordered: cbc, met c, tsh. B12, D, mag+, ammonia, ua, c&s  Medication list reviewed and assessed for continued appropriateness.  Vikki Ports,  NP-C Geriatrics [facility] [phone], [place] [address] Cell Phone (Mon-Fri 8am-5pm):  [phone] On Call:  [phone] & follow prompts after 5pm & weekends Office Phone:  [phone] Office Fax:  [phone]

## 2016-05-06 DIAGNOSIS — R5381 Other malaise: Secondary | ICD-10-CM | POA: Diagnosis not present

## 2016-05-06 LAB — CBC WITH DIFFERENTIAL/PLATELET
Basophils Absolute: 0.1 10*3/uL (ref 0–0.1)
Basophils Relative: 1 %
EOS PCT: 4 %
Eosinophils Absolute: 0.3 10*3/uL (ref 0–0.7)
HCT: 32.7 % — ABNORMAL LOW (ref 35.0–47.0)
Hemoglobin: 10.7 g/dL — ABNORMAL LOW (ref 12.0–16.0)
LYMPHS ABS: 1.4 10*3/uL (ref 1.0–3.6)
Lymphocytes Relative: 17 %
MCH: 30 pg (ref 26.0–34.0)
MCHC: 32.8 g/dL (ref 32.0–36.0)
MCV: 91.6 fL (ref 80.0–100.0)
MONOS PCT: 10 %
Monocytes Absolute: 0.8 10*3/uL (ref 0.2–0.9)
Neutro Abs: 5.6 10*3/uL (ref 1.4–6.5)
Neutrophils Relative %: 68 %
PLATELETS: 365 10*3/uL (ref 150–440)
RBC: 3.57 MIL/uL — ABNORMAL LOW (ref 3.80–5.20)
RDW: 14.9 % — ABNORMAL HIGH (ref 11.5–14.5)
WBC: 8.2 10*3/uL (ref 3.6–11.0)

## 2016-05-06 LAB — GLUCOSE, CAPILLARY
GLUCOSE-CAPILLARY: 132 mg/dL — AB (ref 65–99)
GLUCOSE-CAPILLARY: 118 mg/dL — AB (ref 65–99)
Glucose-Capillary: 166 mg/dL — ABNORMAL HIGH (ref 65–99)
Glucose-Capillary: 108 mg/dL — ABNORMAL HIGH (ref 65–99)

## 2016-05-07 DIAGNOSIS — R5381 Other malaise: Secondary | ICD-10-CM | POA: Diagnosis not present

## 2016-05-07 LAB — GLUCOSE, CAPILLARY
GLUCOSE-CAPILLARY: 164 mg/dL — AB (ref 65–99)
GLUCOSE-CAPILLARY: 111 mg/dL — AB (ref 65–99)
Glucose-Capillary: 149 mg/dL — ABNORMAL HIGH (ref 65–99)

## 2016-05-10 DIAGNOSIS — R5381 Other malaise: Secondary | ICD-10-CM | POA: Diagnosis not present

## 2016-05-10 LAB — GLUCOSE, CAPILLARY
GLUCOSE-CAPILLARY: 107 mg/dL — AB (ref 65–99)
GLUCOSE-CAPILLARY: 131 mg/dL — AB (ref 65–99)
GLUCOSE-CAPILLARY: 166 mg/dL — AB (ref 65–99)
Glucose-Capillary: 159 mg/dL — ABNORMAL HIGH (ref 65–99)
Glucose-Capillary: 116 mg/dL — ABNORMAL HIGH (ref 65–99)
Glucose-Capillary: 124 mg/dL — ABNORMAL HIGH (ref 65–99)

## 2016-05-10 LAB — CBC WITH DIFFERENTIAL/PLATELET
BASOS PCT: 1 %
Basophils Absolute: 0.1 10*3/uL (ref 0–0.1)
EOS ABS: 0.4 10*3/uL (ref 0–0.7)
Eosinophils Relative: 4 %
HCT: 30.3 % — ABNORMAL LOW (ref 35.0–47.0)
HEMOGLOBIN: 10.2 g/dL — AB (ref 12.0–16.0)
Lymphocytes Relative: 14 %
Lymphs Abs: 1.2 10*3/uL (ref 1.0–3.6)
MCH: 30.6 pg (ref 26.0–34.0)
MCHC: 33.9 g/dL (ref 32.0–36.0)
MCV: 90.5 fL (ref 80.0–100.0)
Monocytes Absolute: 1.1 10*3/uL — ABNORMAL HIGH (ref 0.2–0.9)
Monocytes Relative: 13 %
NEUTROS ABS: 5.9 10*3/uL (ref 1.4–6.5)
NEUTROS PCT: 68 %
Platelets: 300 10*3/uL (ref 150–440)
RBC: 3.34 MIL/uL — AB (ref 3.80–5.20)
RDW: 15 % — ABNORMAL HIGH (ref 11.5–14.5)
WBC: 8.7 10*3/uL (ref 3.6–11.0)

## 2016-05-10 LAB — URINALYSIS, COMPLETE (UACMP) WITH MICROSCOPIC
Bilirubin Urine: NEGATIVE
Glucose, UA: NEGATIVE mg/dL
Hgb urine dipstick: NEGATIVE
KETONES UR: NEGATIVE mg/dL
Nitrite: POSITIVE — AB
Protein, ur: NEGATIVE mg/dL
SPECIFIC GRAVITY, URINE: 1.014 (ref 1.005–1.030)
SQUAMOUS EPITHELIAL / LPF: NONE SEEN
pH: 6 (ref 5.0–8.0)

## 2016-05-11 DIAGNOSIS — R5381 Other malaise: Secondary | ICD-10-CM | POA: Diagnosis not present

## 2016-05-11 LAB — GLUCOSE, CAPILLARY
GLUCOSE-CAPILLARY: 144 mg/dL — AB (ref 65–99)
GLUCOSE-CAPILLARY: 110 mg/dL — AB (ref 65–99)
Glucose-Capillary: 125 mg/dL — ABNORMAL HIGH (ref 65–99)
Glucose-Capillary: 111 mg/dL — ABNORMAL HIGH (ref 65–99)

## 2016-05-12 LAB — URINE CULTURE

## 2016-05-12 LAB — GLUCOSE, CAPILLARY
GLUCOSE-CAPILLARY: 186 mg/dL — AB (ref 65–99)
Glucose-Capillary: 101 mg/dL — ABNORMAL HIGH (ref 65–99)
Glucose-Capillary: 125 mg/dL — ABNORMAL HIGH (ref 65–99)
Glucose-Capillary: 111 mg/dL — ABNORMAL HIGH (ref 65–99)

## 2016-05-13 DIAGNOSIS — R5381 Other malaise: Secondary | ICD-10-CM | POA: Diagnosis not present

## 2016-05-13 LAB — GLUCOSE, CAPILLARY
GLUCOSE-CAPILLARY: 198 mg/dL — AB (ref 65–99)
GLUCOSE-CAPILLARY: 111 mg/dL — AB (ref 65–99)

## 2016-05-14 DIAGNOSIS — R5381 Other malaise: Secondary | ICD-10-CM | POA: Diagnosis not present

## 2016-05-14 LAB — GLUCOSE, CAPILLARY
Glucose-Capillary: 124 mg/dL — ABNORMAL HIGH (ref 65–99)
Glucose-Capillary: 121 mg/dL — ABNORMAL HIGH (ref 65–99)

## 2016-05-17 DIAGNOSIS — R5381 Other malaise: Secondary | ICD-10-CM | POA: Diagnosis not present

## 2016-05-17 LAB — CBC WITH DIFFERENTIAL/PLATELET
Basophils Absolute: 0 10*3/uL (ref 0–0.1)
Basophils Relative: 1 %
EOS ABS: 0.4 10*3/uL (ref 0–0.7)
Eosinophils Relative: 5 %
HCT: 30.9 % — ABNORMAL LOW (ref 35.0–47.0)
HEMOGLOBIN: 10.1 g/dL — AB (ref 12.0–16.0)
LYMPHS ABS: 1.2 10*3/uL (ref 1.0–3.6)
Lymphocytes Relative: 14 %
MCH: 29.8 pg (ref 26.0–34.0)
MCHC: 32.7 g/dL (ref 32.0–36.0)
MCV: 91 fL (ref 80.0–100.0)
MONO ABS: 1 10*3/uL — AB (ref 0.2–0.9)
MONOS PCT: 12 %
NEUTROS PCT: 68 %
Neutro Abs: 6.1 10*3/uL (ref 1.4–6.5)
Platelets: 308 10*3/uL (ref 150–440)
RBC: 3.4 MIL/uL — ABNORMAL LOW (ref 3.80–5.20)
RDW: 14.8 % — AB (ref 11.5–14.5)
WBC: 8.8 10*3/uL (ref 3.6–11.0)

## 2016-05-17 LAB — GLUCOSE, CAPILLARY
GLUCOSE-CAPILLARY: 115 mg/dL — AB (ref 65–99)
GLUCOSE-CAPILLARY: 131 mg/dL — AB (ref 65–99)
GLUCOSE-CAPILLARY: 112 mg/dL — AB (ref 65–99)
GLUCOSE-CAPILLARY: 105 mg/dL — AB (ref 65–99)
GLUCOSE-CAPILLARY: 101 mg/dL — AB (ref 65–99)
GLUCOSE-CAPILLARY: 110 mg/dL — AB (ref 65–99)
Glucose-Capillary: 153 mg/dL — ABNORMAL HIGH (ref 65–99)
Glucose-Capillary: 108 mg/dL — ABNORMAL HIGH (ref 65–99)
Glucose-Capillary: 110 mg/dL — ABNORMAL HIGH (ref 65–99)
Glucose-Capillary: 116 mg/dL — ABNORMAL HIGH (ref 65–99)
Glucose-Capillary: 111 mg/dL — ABNORMAL HIGH (ref 65–99)
Glucose-Capillary: 150 mg/dL — ABNORMAL HIGH (ref 65–99)

## 2016-05-18 DIAGNOSIS — R5381 Other malaise: Secondary | ICD-10-CM | POA: Diagnosis not present

## 2016-05-18 LAB — GLUCOSE, CAPILLARY
GLUCOSE-CAPILLARY: 119 mg/dL — AB (ref 65–99)
GLUCOSE-CAPILLARY: 90 mg/dL (ref 65–99)

## 2016-05-19 LAB — GLUCOSE, CAPILLARY
GLUCOSE-CAPILLARY: 140 mg/dL — AB (ref 65–99)
GLUCOSE-CAPILLARY: 108 mg/dL — AB (ref 65–99)
Glucose-Capillary: 100 mg/dL — ABNORMAL HIGH (ref 65–99)

## 2016-05-20 ENCOUNTER — Encounter
Admission: RE | Admit: 2016-05-20 | Discharge: 2016-05-20 | Disposition: A | Discharge status: Home / Self Care | Payer: Medicare Other | Source: Ambulatory Visit | Attending: Internal Medicine | Admitting: Internal Medicine

## 2016-05-20 DIAGNOSIS — D62 Acute posthemorrhagic anemia: Secondary | ICD-10-CM | POA: Insufficient documentation

## 2016-05-20 DIAGNOSIS — E119 Type 2 diabetes mellitus without complications: Secondary | ICD-10-CM | POA: Diagnosis present

## 2016-05-20 LAB — CBC WITH DIFFERENTIAL/PLATELET
Basophils Absolute: 0.1 10*3/uL (ref 0–0.1)
Basophils Relative: 1 %
EOS PCT: 2 %
Eosinophils Absolute: 0.2 10*3/uL (ref 0–0.7)
HEMATOCRIT: 32.1 % — AB (ref 35.0–47.0)
Hemoglobin: 10.7 g/dL — ABNORMAL LOW (ref 12.0–16.0)
LYMPHS ABS: 1.3 10*3/uL (ref 1.0–3.6)
LYMPHS PCT: 11 %
MCH: 29.6 pg (ref 26.0–34.0)
MCHC: 33.2 g/dL (ref 32.0–36.0)
MCV: 89.1 fL (ref 80.0–100.0)
MONO ABS: 1 10*3/uL — AB (ref 0.2–0.9)
MONOS PCT: 9 %
NEUTROS ABS: 9.3 10*3/uL — AB (ref 1.4–6.5)
Neutrophils Relative %: 77 %
PLATELETS: 346 10*3/uL (ref 150–440)
RBC: 3.6 MIL/uL — ABNORMAL LOW (ref 3.80–5.20)
RDW: 14.8 % — AB (ref 11.5–14.5)
WBC: 11.9 10*3/uL — ABNORMAL HIGH (ref 3.6–11.0)

## 2016-05-20 LAB — GLUCOSE, CAPILLARY
GLUCOSE-CAPILLARY: 88 mg/dL (ref 65–99)
Glucose-Capillary: 108 mg/dL — ABNORMAL HIGH (ref 65–99)
Glucose-Capillary: 101 mg/dL — ABNORMAL HIGH (ref 65–99)
Glucose-Capillary: 98 mg/dL (ref 65–99)

## 2016-05-21 DIAGNOSIS — D62 Acute posthemorrhagic anemia: Secondary | ICD-10-CM | POA: Diagnosis not present

## 2016-05-21 LAB — GLUCOSE, CAPILLARY
GLUCOSE-CAPILLARY: 87 mg/dL (ref 65–99)
Glucose-Capillary: 105 mg/dL — ABNORMAL HIGH (ref 65–99)
Glucose-Capillary: 107 mg/dL — ABNORMAL HIGH (ref 65–99)
Glucose-Capillary: 123 mg/dL — ABNORMAL HIGH (ref 65–99)

## 2016-05-24 DIAGNOSIS — D62 Acute posthemorrhagic anemia: Secondary | ICD-10-CM | POA: Diagnosis not present

## 2016-05-24 LAB — CBC WITH DIFFERENTIAL/PLATELET
Basophils Absolute: 0 10*3/uL (ref 0–0.1)
Basophils Relative: 0 %
EOS PCT: 4 %
Eosinophils Absolute: 0.4 10*3/uL (ref 0–0.7)
HCT: 31.8 % — ABNORMAL LOW (ref 35.0–47.0)
Hemoglobin: 10.6 g/dL — ABNORMAL LOW (ref 12.0–16.0)
LYMPHS ABS: 1.2 10*3/uL (ref 1.0–3.6)
LYMPHS PCT: 11 %
MCH: 29.9 pg (ref 26.0–34.0)
MCHC: 33.2 g/dL (ref 32.0–36.0)
MCV: 90.1 fL (ref 80.0–100.0)
MONO ABS: 1 10*3/uL — AB (ref 0.2–0.9)
Monocytes Relative: 10 %
Neutro Abs: 7.9 10*3/uL — ABNORMAL HIGH (ref 1.4–6.5)
Neutrophils Relative %: 75 %
PLATELETS: 349 10*3/uL (ref 150–440)
RBC: 3.53 MIL/uL — ABNORMAL LOW (ref 3.80–5.20)
RDW: 15.1 % — AB (ref 11.5–14.5)
WBC: 10.6 10*3/uL (ref 3.6–11.0)

## 2016-05-24 LAB — GLUCOSE, CAPILLARY
Glucose-Capillary: 115 mg/dL — ABNORMAL HIGH (ref 65–99)
Glucose-Capillary: 136 mg/dL — ABNORMAL HIGH (ref 65–99)
Glucose-Capillary: 97 mg/dL (ref 65–99)
Glucose-Capillary: 104 mg/dL — ABNORMAL HIGH (ref 65–99)
Glucose-Capillary: 110 mg/dL — ABNORMAL HIGH (ref 65–99)

## 2016-05-25 DIAGNOSIS — D62 Acute posthemorrhagic anemia: Secondary | ICD-10-CM | POA: Diagnosis not present

## 2016-05-25 LAB — GLUCOSE, CAPILLARY
Glucose-Capillary: 106 mg/dL — ABNORMAL HIGH (ref 65–99)
Glucose-Capillary: 95 mg/dL (ref 65–99)
Glucose-Capillary: 100 mg/dL — ABNORMAL HIGH (ref 65–99)
Glucose-Capillary: 96 mg/dL (ref 65–99)

## 2016-05-26 LAB — GLUCOSE, CAPILLARY
GLUCOSE-CAPILLARY: 113 mg/dL — AB (ref 65–99)
GLUCOSE-CAPILLARY: 145 mg/dL — AB (ref 65–99)
Glucose-Capillary: 112 mg/dL — ABNORMAL HIGH (ref 65–99)
Glucose-Capillary: 113 mg/dL — ABNORMAL HIGH (ref 65–99)

## 2016-05-27 DIAGNOSIS — D62 Acute posthemorrhagic anemia: Secondary | ICD-10-CM | POA: Diagnosis not present

## 2016-05-27 LAB — GLUCOSE, CAPILLARY
GLUCOSE-CAPILLARY: 118 mg/dL — AB (ref 65–99)
GLUCOSE-CAPILLARY: 120 mg/dL — AB (ref 65–99)

## 2016-05-28 DIAGNOSIS — D62 Acute posthemorrhagic anemia: Secondary | ICD-10-CM | POA: Diagnosis not present

## 2016-05-28 LAB — GLUCOSE, CAPILLARY
GLUCOSE-CAPILLARY: 148 mg/dL — AB (ref 65–99)
Glucose-Capillary: 119 mg/dL — ABNORMAL HIGH (ref 65–99)

## 2016-05-31 ENCOUNTER — Other Ambulatory Visit
Admission: RE | Admit: 2016-05-31 | Discharge: 2016-05-31 | Disposition: A | Discharge status: Home / Self Care | Payer: Medicare Other | Source: Ambulatory Visit | Attending: Gerontology | Admitting: Gerontology

## 2016-05-31 DIAGNOSIS — D62 Acute posthemorrhagic anemia: Secondary | ICD-10-CM | POA: Insufficient documentation

## 2016-05-31 LAB — CBC WITH DIFFERENTIAL/PLATELET
BASOS ABS: 0 10*3/uL (ref 0–0.1)
Basophils Relative: 0 %
EOS PCT: 2 %
Eosinophils Absolute: 0.2 10*3/uL (ref 0–0.7)
HCT: 34.1 % — ABNORMAL LOW (ref 35.0–47.0)
HEMOGLOBIN: 11.4 g/dL — AB (ref 12.0–16.0)
LYMPHS ABS: 1.2 10*3/uL (ref 1.0–3.6)
LYMPHS PCT: 11 %
MCH: 30 pg (ref 26.0–34.0)
MCHC: 33.4 g/dL (ref 32.0–36.0)
MCV: 90.1 fL (ref 80.0–100.0)
Monocytes Absolute: 1.2 10*3/uL — ABNORMAL HIGH (ref 0.2–0.9)
Monocytes Relative: 11 %
NEUTROS ABS: 8.5 10*3/uL — AB (ref 1.4–6.5)
NEUTROS PCT: 76 %
PLATELETS: 288 10*3/uL (ref 150–440)
RBC: 3.79 MIL/uL — AB (ref 3.80–5.20)
RDW: 14.5 % (ref 11.5–14.5)
WBC: 11.1 10*3/uL — AB (ref 3.6–11.0)

## 2016-05-31 LAB — GLUCOSE, CAPILLARY
GLUCOSE-CAPILLARY: 104 mg/dL — AB (ref 65–99)
GLUCOSE-CAPILLARY: 96 mg/dL (ref 65–99)
GLUCOSE-CAPILLARY: 92 mg/dL (ref 65–99)
GLUCOSE-CAPILLARY: 121 mg/dL — AB (ref 65–99)
GLUCOSE-CAPILLARY: 104 mg/dL — AB (ref 65–99)
Glucose-Capillary: 110 mg/dL — ABNORMAL HIGH (ref 65–99)
Glucose-Capillary: 96 mg/dL (ref 65–99)
Glucose-Capillary: 131 mg/dL — ABNORMAL HIGH (ref 65–99)
Glucose-Capillary: 100 mg/dL — ABNORMAL HIGH (ref 65–99)
Glucose-Capillary: 94 mg/dL (ref 65–99)
Glucose-Capillary: 119 mg/dL — ABNORMAL HIGH (ref 65–99)
Glucose-Capillary: 114 mg/dL — ABNORMAL HIGH (ref 65–99)

## 2016-06-01 DIAGNOSIS — D62 Acute posthemorrhagic anemia: Secondary | ICD-10-CM | POA: Diagnosis not present

## 2016-06-01 LAB — GLUCOSE, CAPILLARY
Glucose-Capillary: 145 mg/dL — ABNORMAL HIGH (ref 65–99)
Glucose-Capillary: 105 mg/dL — ABNORMAL HIGH (ref 65–99)

## 2016-06-02 LAB — GLUCOSE, CAPILLARY
GLUCOSE-CAPILLARY: 132 mg/dL — AB (ref 65–99)
Glucose-Capillary: 138 mg/dL — ABNORMAL HIGH (ref 65–99)

## 2016-06-03 ENCOUNTER — Other Ambulatory Visit
Admission: RE | Admit: 2016-06-03 | Discharge: 2016-06-03 | Disposition: A | Discharge status: Home / Self Care | Payer: Medicare Other | Source: Ambulatory Visit | Attending: Internal Medicine | Admitting: Internal Medicine

## 2016-06-03 DIAGNOSIS — D62 Acute posthemorrhagic anemia: Secondary | ICD-10-CM | POA: Diagnosis not present

## 2016-06-03 LAB — CBC WITH DIFFERENTIAL/PLATELET
Basophils Absolute: 0.1 10*3/uL (ref 0–0.1)
Basophils Relative: 1 %
EOS ABS: 0.3 10*3/uL (ref 0–0.7)
EOS PCT: 2 %
HCT: 34.3 % — ABNORMAL LOW (ref 35.0–47.0)
Hemoglobin: 11.3 g/dL — ABNORMAL LOW (ref 12.0–16.0)
LYMPHS ABS: 1.6 10*3/uL (ref 1.0–3.6)
Lymphocytes Relative: 13 %
MCH: 29.5 pg (ref 26.0–34.0)
MCHC: 32.8 g/dL (ref 32.0–36.0)
MCV: 89.8 fL (ref 80.0–100.0)
MONO ABS: 1.3 10*3/uL — AB (ref 0.2–0.9)
MONOS PCT: 10 %
Neutro Abs: 9.2 10*3/uL — ABNORMAL HIGH (ref 1.4–6.5)
Neutrophils Relative %: 74 %
PLATELETS: 318 10*3/uL (ref 150–440)
RBC: 3.82 MIL/uL (ref 3.80–5.20)
RDW: 15 % — AB (ref 11.5–14.5)
WBC: 12.4 10*3/uL — AB (ref 3.6–11.0)

## 2016-06-03 LAB — GLUCOSE, CAPILLARY
Glucose-Capillary: 148 mg/dL — ABNORMAL HIGH (ref 65–99)
Glucose-Capillary: 98 mg/dL (ref 65–99)
Glucose-Capillary: 132 mg/dL — ABNORMAL HIGH (ref 65–99)
Glucose-Capillary: 109 mg/dL — ABNORMAL HIGH (ref 65–99)

## 2016-06-04 DIAGNOSIS — D62 Acute posthemorrhagic anemia: Secondary | ICD-10-CM | POA: Diagnosis not present

## 2016-06-04 LAB — GLUCOSE, CAPILLARY
GLUCOSE-CAPILLARY: 115 mg/dL — AB (ref 65–99)
GLUCOSE-CAPILLARY: 114 mg/dL — AB (ref 65–99)
Glucose-Capillary: 112 mg/dL — ABNORMAL HIGH (ref 65–99)
Glucose-Capillary: 148 mg/dL — ABNORMAL HIGH (ref 65–99)

## 2016-06-07 DIAGNOSIS — D62 Acute posthemorrhagic anemia: Secondary | ICD-10-CM | POA: Diagnosis not present

## 2016-06-07 LAB — GLUCOSE, CAPILLARY
GLUCOSE-CAPILLARY: 134 mg/dL — AB (ref 65–99)
GLUCOSE-CAPILLARY: 120 mg/dL — AB (ref 65–99)
GLUCOSE-CAPILLARY: 137 mg/dL — AB (ref 65–99)
Glucose-Capillary: 122 mg/dL — ABNORMAL HIGH (ref 65–99)
Glucose-Capillary: 144 mg/dL — ABNORMAL HIGH (ref 65–99)
Glucose-Capillary: 119 mg/dL — ABNORMAL HIGH (ref 65–99)

## 2016-06-08 ENCOUNTER — Non-Acute Institutional Stay (SKILLED_NURSING_FACILITY): Payer: Medicare Other | Admitting: Gerontology

## 2016-06-08 DIAGNOSIS — R400 Somnolence: Secondary | ICD-10-CM | POA: Diagnosis not present

## 2016-06-08 DIAGNOSIS — Z515 Encounter for palliative care: Secondary | ICD-10-CM

## 2016-06-08 DIAGNOSIS — D62 Acute posthemorrhagic anemia: Secondary | ICD-10-CM | POA: Diagnosis not present

## 2016-06-08 LAB — GLUCOSE, CAPILLARY
GLUCOSE-CAPILLARY: 218 mg/dL — AB (ref 65–99)
GLUCOSE-CAPILLARY: 185 mg/dL — AB (ref 65–99)
Glucose-Capillary: 162 mg/dL — ABNORMAL HIGH (ref 65–99)
Glucose-Capillary: 178 mg/dL — ABNORMAL HIGH (ref 65–99)

## 2016-06-09 LAB — GLUCOSE, CAPILLARY
GLUCOSE-CAPILLARY: 220 mg/dL — AB (ref 65–99)
Glucose-Capillary: 191 mg/dL — ABNORMAL HIGH (ref 65–99)
Glucose-Capillary: 176 mg/dL — ABNORMAL HIGH (ref 65–99)
Glucose-Capillary: 195 mg/dL — ABNORMAL HIGH (ref 65–99)

## 2016-06-10 DIAGNOSIS — D62 Acute posthemorrhagic anemia: Secondary | ICD-10-CM | POA: Diagnosis not present

## 2016-06-10 LAB — GLUCOSE, CAPILLARY
GLUCOSE-CAPILLARY: 186 mg/dL — AB (ref 65–99)
GLUCOSE-CAPILLARY: 213 mg/dL — AB (ref 65–99)
Glucose-Capillary: 193 mg/dL — ABNORMAL HIGH (ref 65–99)
Glucose-Capillary: 229 mg/dL — ABNORMAL HIGH (ref 65–99)

## 2016-06-20 DEATH — deceased

## 2016-06-21 ENCOUNTER — Non-Acute Institutional Stay: Payer: Self-pay | Admitting: Gerontology

## 2016-06-21 DIAGNOSIS — R400 Somnolence: Secondary | ICD-10-CM

## 2016-06-21 DIAGNOSIS — Z515 Encounter for palliative care: Secondary | ICD-10-CM

## 2016-06-21 NOTE — Progress Notes (Addendum)
Review of Systems    Physical Exam                This encounter was created in error - please disregard.

## 2016-07-13 NOTE — Addendum Note (Signed)
Addended by: Lorenso Quarry H on: 07/13/2016 12:32 PM   Modules accepted: Level of Service, SmartSet

## 2016-07-13 NOTE — Progress Notes (Signed)
Location:    Place of Service:  SNF (31) Provider:  Lorenso Quarry, NP-C  Rafael Bihari, MD  Patient Care Team: Rafael Bihari, MD as PCP - General (Internal Medicine)  Extended Emergency Contact Information Primary Emergency Contact: Brenton Grills [place] Home Phone: [phone] Relation: Daughter Secondary Emergency Contact: Sharilyn Sites Address: [address] [place], [place] [address] [place] Home Phone: [phone] Mobile Phone: [phone] Relation: Spouse  Code Status:  dnr Goals of care: Advanced Directive information Advanced Directives 04/30/2016  Does Patient Have a Medical Advance Directive? Yes  Type of Advance Directive -  Does patient want to make changes to medical advance directive? -  Copy of Healthcare Power of Attorney in Chart? -  Would patient like information on creating a medical advance directive? -  Pre-existing out of facility DNR order (yellow form or pink MOST form) -        Chief Complaint  Patient presents with  . Follow-up    HPI:  Pt is a 76 y.o. female seen today for an acute visit for increased somnolence, decreased responsiveness. Nursing spoke with daughter and updated on pt condition. Nursing offered to collect a urine sample to evaluate for UTI. Daughter declined, opted instead to keep pt comfortable. Pt unable to safely swallow medications. Tachycardic. Low grade temp. Pt quickly falls back asleep when roused. Does respond some to name. O2 in place via [place]. Nursing to continue to monitor closely for s/s of pain/discomfort. Will dc non-essential meds and initiate comfort meds. No other complaints at this time.       Past Medical History:  Diagnosis Date  . Arthritis   . Coronary artery disease    a. 11/2014 CT chest w/ coronary atherosclerosis.  . Depression   . Diabetes mellitus without complication (HCC)   . Fever due to malaria    76 years old  .  Fibromyalgia   . GERD (gastroesophageal reflux disease)   . History of hiatal hernia   . Hypertension   . Hypothyroidism   . Neuromuscular disorder (HCC)    Bells palsy left side of face  . Neuropathy of both feet   . Osteoarthritis    a. s/p R TKA 12/2015.  Marland Kitchen Pneumonia 1997   history of walking pneumonia  . Sleep apnea    not yet had sleep study        Past Surgical History:  Procedure Laterality Date  . ABDOMINAL HYSTERECTOMY    . APPENDECTOMY    . BACK SURGERY    . BLADDER SUSPENSION  2002  . CHOLECYSTECTOMY    . ESOPHAGOGASTRODUODENOSCOPY (EGD) WITH PROPOFOL N/A 04/30/2016   Procedure: ESOPHAGOGASTRODUODENOSCOPY (EGD) WITH PROPOFOL;  Surgeon: Midge Minium, MD;  Location: [facility];  Service: Endoscopy;  Laterality: N/A;  . KNEE ARTHROPLASTY Right 01/12/2016   Procedure: COMPUTER ASSISTED TOTAL KNEE ARTHROPLASTY;  Surgeon: Donato Heinz, MD;  Location: [facility] OR[facility];  Service: Orthopedics;  Laterality: Right;  . ORIF ANKLE FRACTURE Left 03/19/2016   Procedure: OPEN REDUCTION INTERNAL FIXATION (ORIF) ANKLE FRACTURE;  Surgeon: Donato Heinz, MD;  Location: [facility] OR[facility];  Service: Orthopedics;  Laterality: Left;  . ORIF PATELLA Right 03/19/2016   Procedure: OPEN REDUCTION INTERNAL (ORIF) FIXATION PATELLA;  Surgeon: Donato Heinz, MD;  Location: [facility];  Service: Orthopedics;  Laterality: Right;  . TONSILLECTOMY  Allergies  Allergen Reactions  . Oxycodone Other (See Comments)    "stopped breathing"        Allergies as of 06/21/2016      Reactions   Oxycodone Other (See Comments)   "stopped breathing"               Medication List           Accurate as of 06/21/16 11:01 PM. Always use your most recent med list.           acetaminophen 325 MG tablet Commonly known as:  TYLENOL Take 2 tablets (650 mg total) by mouth every 6 (six) hours as needed for mild pain (or Fever >/= 101).   atorvastatin 40 MG  tablet Commonly known as:  LIPITOR Take 40 mg by mouth every morning.   CALCIUM 600+D 600-800 MG-UNIT Tabs Generic drug:  Calcium Carb-Cholecalciferol Take 1 Dose by mouth 2 (two) times daily.   CENTRUM WOMEN PO Take 1 tablet by mouth every morning.   citalopram 20 MG tablet Commonly known as:  CELEXA Take 1 tablet (20 mg total) by mouth every morning.   ferrous sulfate 325 (65 FE) MG tablet Take 325 mg by mouth daily with breakfast.   gabapentin 600 MG tablet Commonly known as:  NEURONTIN Take 0.5 tablets (300 mg total) by mouth 2 (two) times daily.   haloperidol 0.5 MG tablet Commonly known as:  HALDOL Take 1 tablet (0.5 mg total) by mouth at bedtime.   haloperidol 0.5 MG tablet Commonly known as:  HALDOL Take 1 tablet (0.5 mg total) by mouth 2 (two) times daily as needed for agitation.   levothyroxine 50 MCG tablet Commonly known as:  SYNTHROID, LEVOTHROID Take 50 mcg by mouth daily before breakfast.   LORazepam 0.5 MG tablet Commonly known as:  ATIVAN Take 1 tablet (0.5 mg total) by mouth at bedtime as needed for anxiety.   nortriptyline 75 MG capsule Commonly known as:  PAMELOR Take 75 mg by mouth at bedtime.   pantoprazole 40 MG tablet Commonly known as:  PROTONIX Take 1 tablet (40 mg total) by mouth 2 (two) times daily.   traMADol 50 MG tablet Commonly known as:  ULTRAM Take 1 tablet (50 mg total) by mouth every 6 (six) hours as needed for moderate pain.       Review of Systems  Unable to perform ROS: Mental status change  Constitutional: Positive for activity change, appetite change, fatigue and fever. Negative for chills and diaphoresis.  HENT: Negative for congestion, sneezing, sore throat, trouble swallowing and voice change.   Respiratory: Positive for shortness of breath. Negative for apnea, cough, choking, chest tightness and wheezing.   Cardiovascular: Negative for chest pain, palpitations and leg swelling.  Gastrointestinal:  Negative for abdominal distention, abdominal pain, constipation, diarrhea and nausea.  Genitourinary: Negative for difficulty urinating, dysuria, frequency and urgency.  Musculoskeletal: Negative for back pain, gait problem and myalgias. Arthralgias: typical arthritis.  Skin: Negative for color change, pallor, rash and wound.  Neurological: Positive for weakness. Negative for tremors, syncope, facial asymmetry, speech difficulty and headaches.  Psychiatric/Behavioral: Negative for agitation, behavioral problems, confusion, decreased concentration and dysphoric mood.  All other systems reviewed and are negative.       Immunization History  Administered Date(s) Administered  . Influenza,inj,Quad PF,36+ Mos 01/14/2016       Pertinent  Health Maintenance Due  Topic Date Due  . FOOT EXAM  09/05/1950  . OPHTHALMOLOGY EXAM  09/05/1950  . URINE  MICROALBUMIN  09/05/1950  . COLONOSCOPY  09/05/1992  . DEXA SCAN  09/06/2007  . PNA vac Low Risk Adult (1 of 2 - PCV13) 09/06/2007  . HEMOGLOBIN A1C  10/14/2016  . INFLUENZA VACCINE  10/20/2016  . MAMMOGRAM  12/31/2017   No flowsheet data found. Functional Status Survey:     Vitals:   06/08/16 2030  BP: (!) 148/91  Pulse: (!) 117  Resp: (!) 24  Temp: 98.2 F (36.8 C)  SpO2: 96%   There is no height or weight on file to calculate BMI. Physical Exam  Constitutional: Vital signs are normal. She appears well-developed and well-nourished. She appears lethargic. She is sleeping. She is easily aroused. She has a sickly appearance. She does not appear ill. No distress.  HENT:  Head: Normocephalic and atraumatic.  Mouth/Throat: Uvula is midline and oropharynx is clear and moist. Mucous membranes are pale, dry (tachy) and not cyanotic.  Eyes: Conjunctivae, EOM and lids are normal. Pupils are equal, round, and reactive to light.  Neck: Trachea normal, normal range of motion and full passive range of motion without pain. Neck supple. No JVD  present. No tracheal deviation, no edema and no erythema present. No thyromegaly present.  Cardiovascular: Normal heart sounds and intact distal pulses.  An irregular rhythm present. Tachycardia present.  Exam reveals no gallop, no distant heart sounds and no friction rub.   No murmur heard. Pulses:      Dorsalis pedis pulses are 1+ on the right side, and 1+ on the left side.  Pulmonary/Chest: Effort normal. No accessory muscle usage. No respiratory distress. She has decreased breath sounds in the right lower field and the left lower field. She has no wheezes. She has no rhonchi. She has no rales. She exhibits no tenderness.  Abdominal: Soft. Normal appearance and bowel sounds are normal. She exhibits no distension and no ascites. There is no tenderness.  Musculoskeletal: She exhibits no edema or tenderness.       Right knee: She exhibits decreased range of motion and swelling.  Expected osteoarthritis, stiffness. Bledsoe brace in place. Calves soft, supple. Negative Homans' sign  Neurological: She is easily aroused. She has normal strength. She appears lethargic.  Skin: Skin is warm, dry and intact. No rash noted. She is not diaphoretic. No cyanosis or erythema. There is pallor. Nails show no clubbing.  Skin pale, cool  Psychiatric: Thought content normal. She is withdrawn. Cognition and memory are impaired. She exhibits abnormal recent memory and abnormal remote memory.  Nursing note and vitals reviewed.   Labs reviewed:  RecentLabs(withinlast365days)   Recent Labs  04/19/16 0458 04/20/16 1058 04/20/16 2028 04/21/16 0445  04/28/16 1615  04/29/16 0626 04/30/16 0106 05/01/16 0516  NA 142  --   --  138  < > 137  < > 137 140 139  K 3.5  --   --  3.7  < > 5.4*  < > 4.8 4.3 4.0  CL 112*  --   --  104  < > 102  < > 107 108 106  CO2 27  --   --  29  < > 30  < > GLUCOSE 92  --   --  106*  < > 103*  < > 106* 79 95  BUN 18  --   --  12  < > 72*  < > 66* 43* 24*   CREATININE 0.80  --   --  0.85  < > 1.70*  < >  1.66* 1.24* 0.97  CALCIUM 8.7*  --   --  8.9  < > 10.2  < > 9.4 9.2 9.5  MG 2.1  --   --  1.8  --  2.0  --   --   --   --   PHOS  --  1.3* 2.2* 4.6  --   --   --   --   --   --   < > = values in this interval not displayed.    RecentLabs(withinlast365days)   Recent Labs  04/15/16 2032 04/24/16 0700 04/28/16 1615  AST 14* 19 16  ALT 9* 13* 12*  ALKPHOS 84 67 50  BILITOT 0.4 0.3 0.3  PROT 6.9 5.7* 5.2*  ALBUMIN 3.6 3.0* 2.9*      RecentLabs(withinlast365days)   Recent Labs  05/24/16 0610 05/31/16 0500 06/03/16 0605  WBC 10.6 11.1* 12.4*  NEUTROABS 7.9* 8.5* 9.2*  HGB 10.6* 11.4* 11.3*  HCT 31.8* 34.1* 34.3*  MCV 90.1 90.1 89.8  PLT 349 288 318     RecentLabs       Lab Results  Component Value Date   TSH 3.109 04/28/2016     RecentLabs       Lab Results  Component Value Date   HGBA1C 5.2 04/16/2016     RecentLabs       Lab Results  Component Value Date   CHOL 106 04/28/2016   HDL 27 (L) 04/28/2016   LDLCALC 22 04/28/2016   TRIG 287 (H) 04/28/2016   CHOLHDL 3.9 04/28/2016      Significant Diagnostic Results in last 30 days:  ImagingResults  No results found.    Assessment/Plan 1. Somnolence  No treatment/testing  Keep pt comfortable  Update Hospice RN  2. Encounter for dying care  DC all non-essential meds  Morphine 20 mg/ mL 0.25-0.5 mL PO Q 1 hour prn pain, dyspnea  Family/ staff Communication:   Total Time:  Documentation:  Face to Face:  Family/Phone:   Labs/tests ordered:    Medication list reviewed and assessed for continued appropriateness.  Brynda Rim, NP-C [facility]iatric[facility] [phone], [place] [address] Cell Phone (Mon-Fri 8am-5pm):  [phone] On Call:  [phone] & follow prompts after 5pm & weekends Office Phone:  [phone] Office Fax:  [phone]      Instructions      Return in about 1 day (around 06/22/2016).  Routing History   There are no sent or routed communications associated with this encounter.

## 2017-08-27 IMAGING — CT CT HEAD W/O CM
3 series · 16 of 46 positions shown, 19 images · non-contrast
Comparison: None.

CLINICAL DATA: Sliding fall from bed this morning. Knee replacement
[REDACTED] of this week. Husband assumed hypoglycemia and administered
juice and 2 oxycodone. Patient presents at emergency room drowsy and
with altered mental status.

EXAM:
CT HEAD WITHOUT CONTRAST
TECHNIQUE: Contiguous axial images were obtained from the base of the skull
through the vertex without intravenous contrast.

[Series 3: head wo · axial · 0.41mm/px · z∈[-41,+79]mm · 10 of 29 slices shown, 13 images]
[im 3/29  brain]
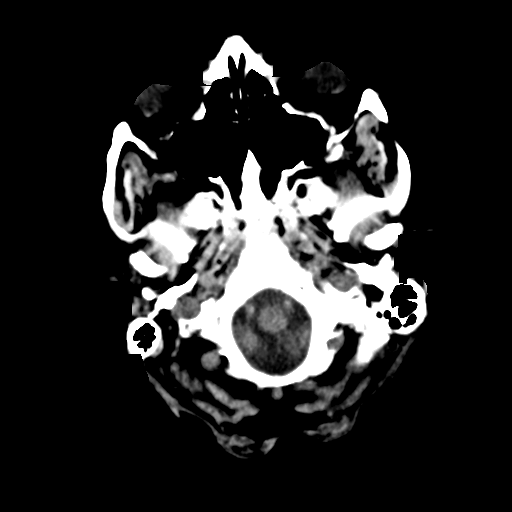
[im 3/29  bone]
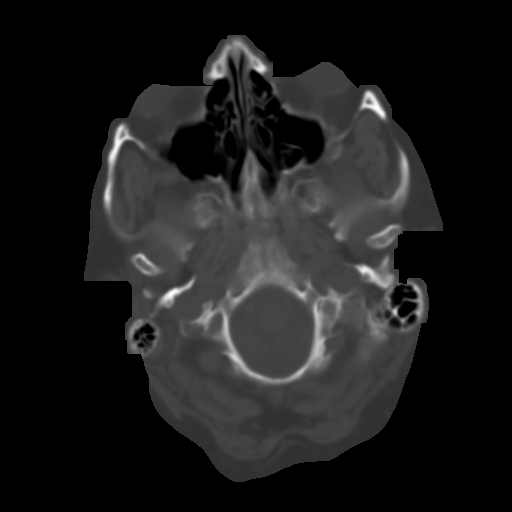
[im 6/29  brain]
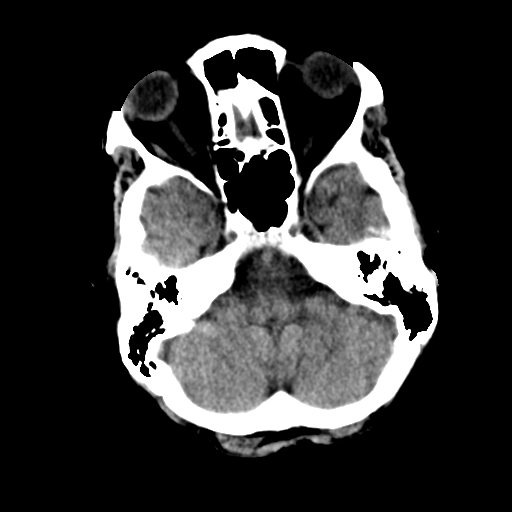
[im 8/29  brain]
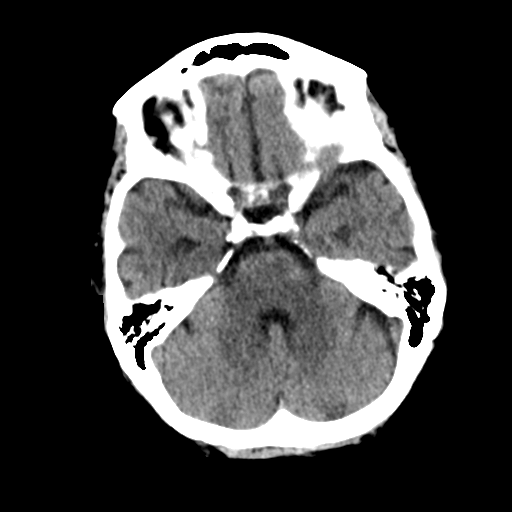
[im 11/29  brain]
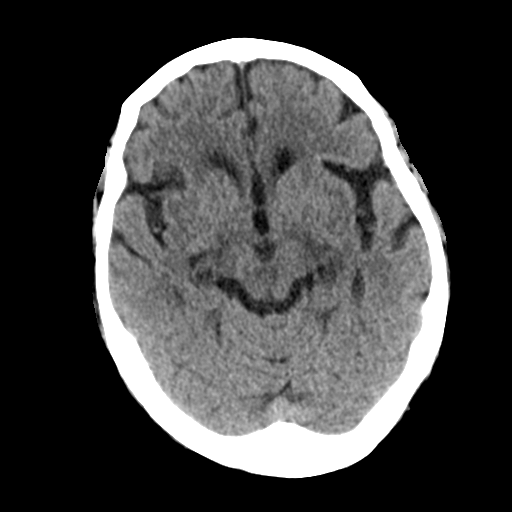
[im 14/29  brain]
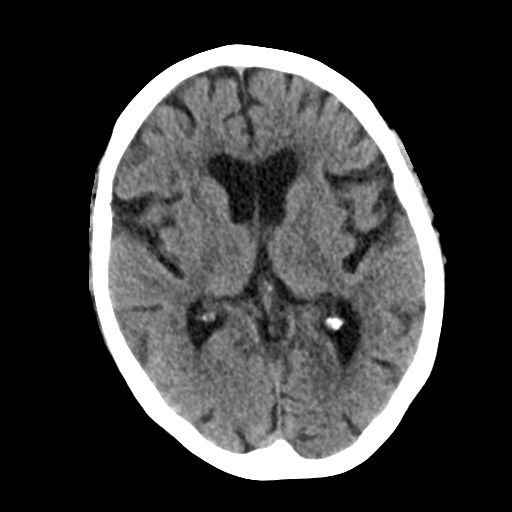
[im 14/29  bone]
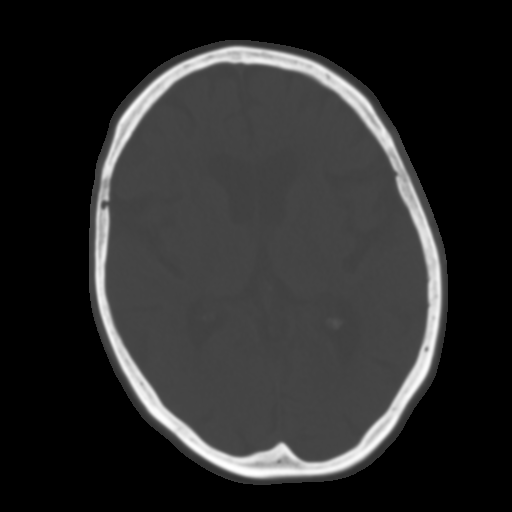
[im 16/29  brain]
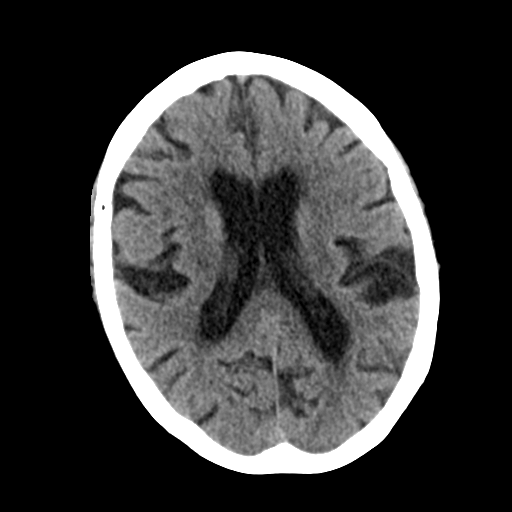
[im 19/29  brain]
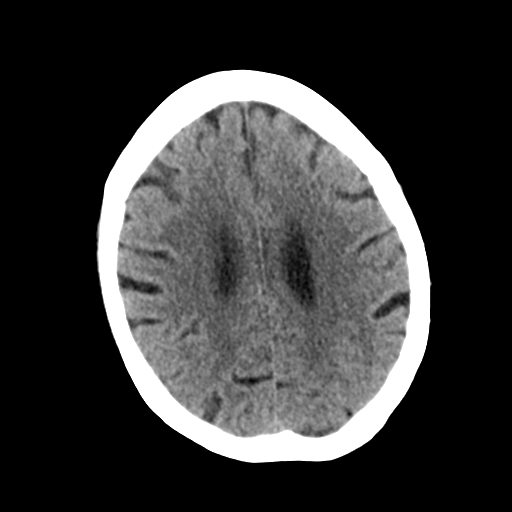
[im 22/29  brain]
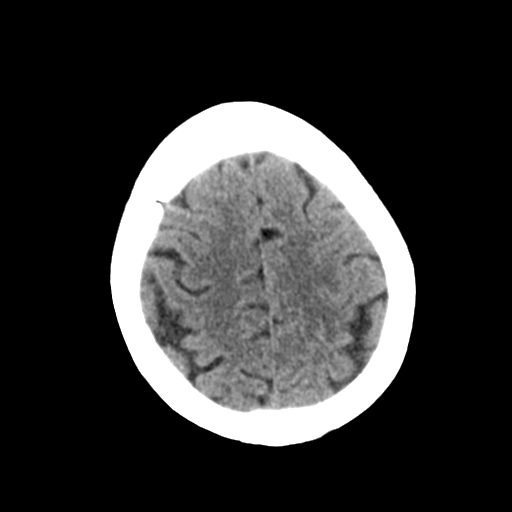
[im 24/29  brain]
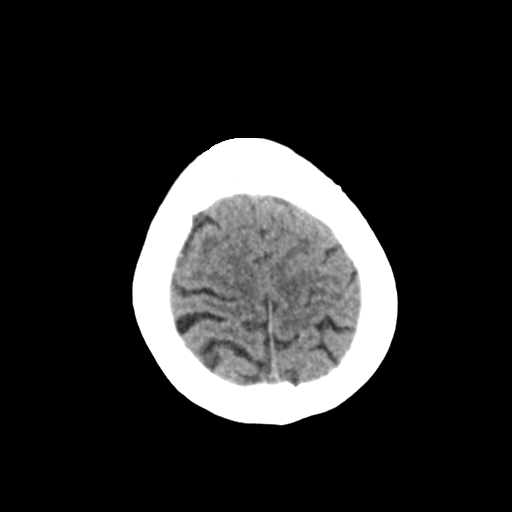
[im 24/29  bone]
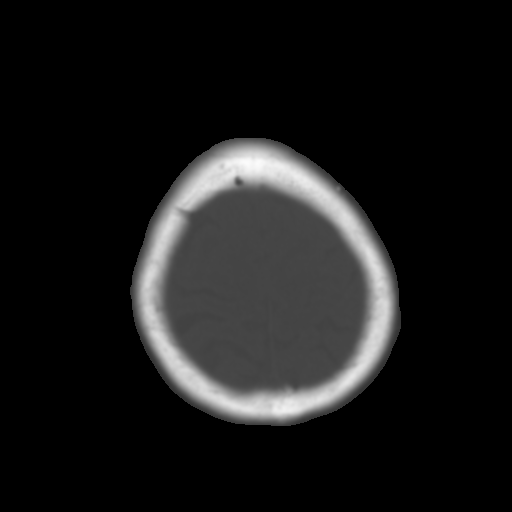
[im 27/29  brain]
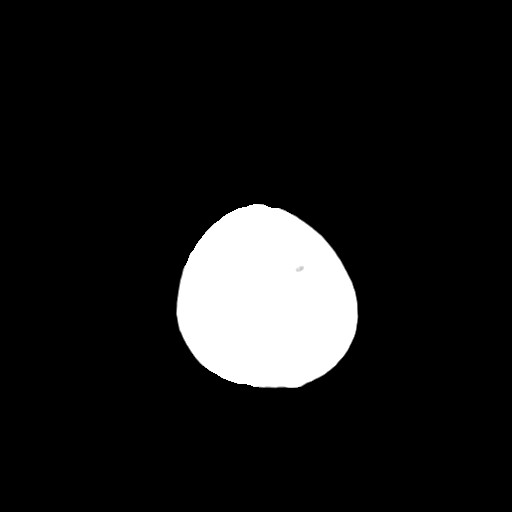

[Series 5: coronal soft tissue · coronal · 0.29mm/px · 3 of 63 slices shown]
[im 21/63  brain]
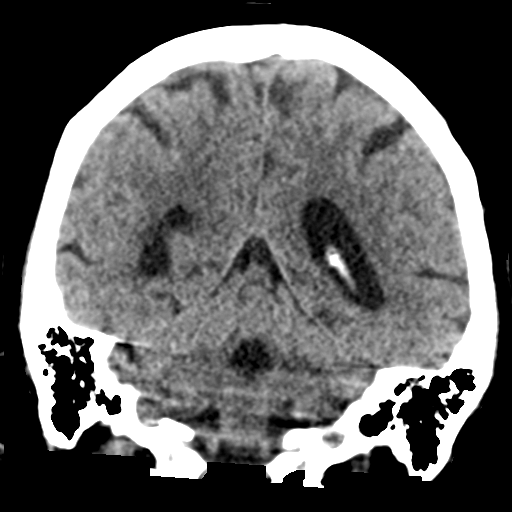
[im 28/63  brain]
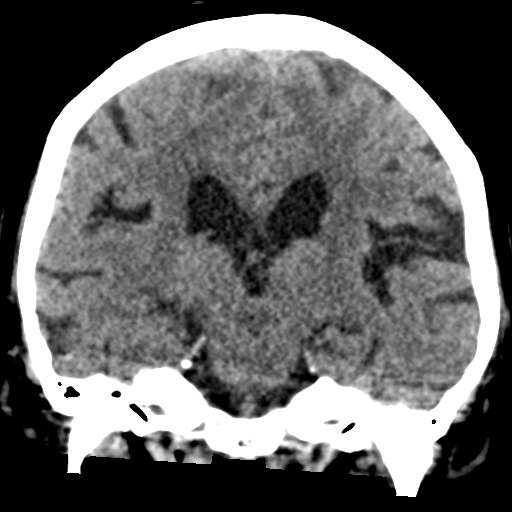
[im 35/63  brain]
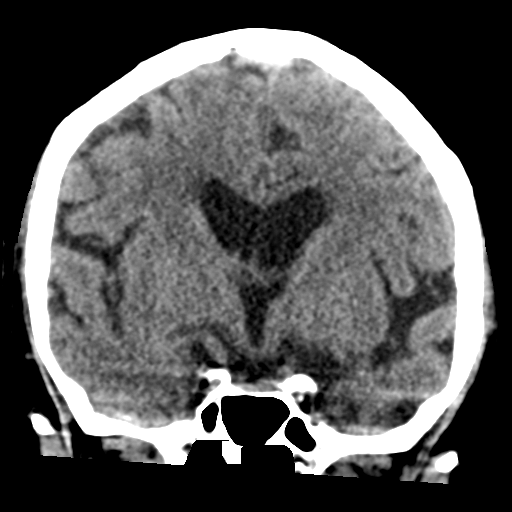

[Series 6: sagittal soft tissue · sagittal · 0.30mm/px · 3 of 51 slices shown]
[im 17/51  brain]
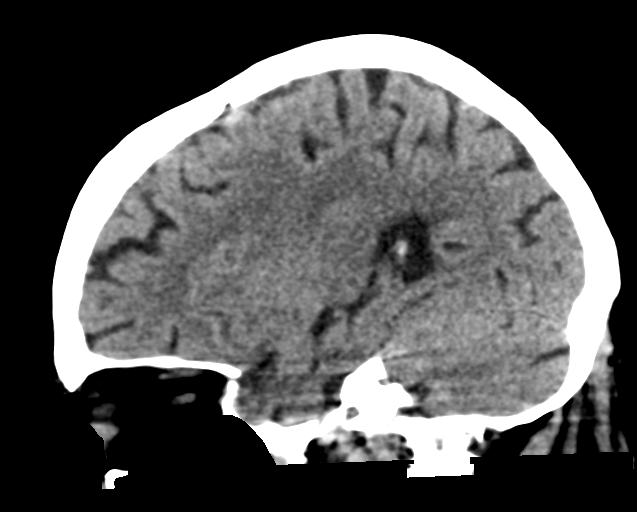
[im 26/51  brain]
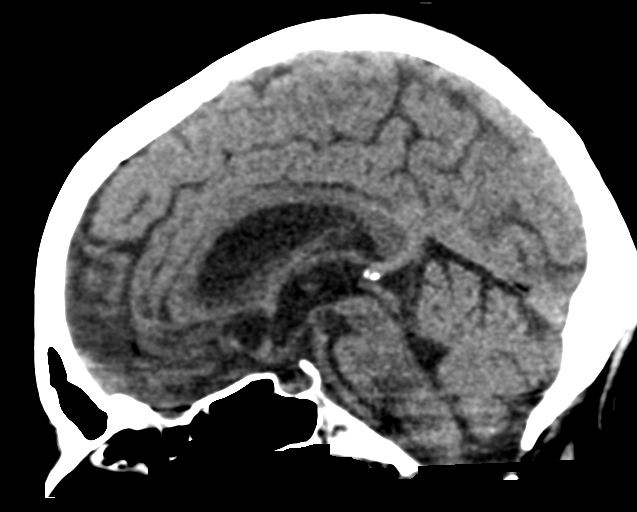
[im 34/51  brain]
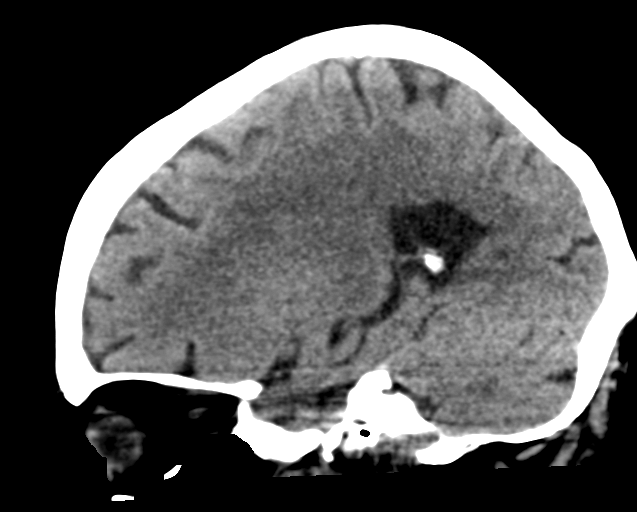

[16 of 46 positions shown; findings below may reference images not displayed]

FINDINGS: Brain: The brainstem, cerebellum, cerebral peduncles, thalami, basal
ganglia, basilar cisterns, and ventricular system appear within
normal limits. Periventricular white matter and corona radiata
hypodensities favor chronic ischemic microvascular white matter
disease. No intracranial hemorrhage, mass lesion, or acute CVA.

Vascular: There is atherosclerotic calcification of the cavernous
carotid arteries bilaterally.

Skull: Unremarkable

Sinuses/Orbits: Unremarkable

Other: No supplemental non-categorized findings.
IMPRESSION: 1. No acute intracranial findings.
2. Periventricular white matter and corona radiata hypodensities
favor chronic ischemic microvascular white matter disease.
